# Patient Record
Sex: Male | Born: 1955 | Race: White | Hispanic: No | Marital: Single | Smoking: Never smoker
Health system: Southern US, Community
[De-identification: ages and names within clinical notes are randomized; demographics above are authoritative.]

## PROBLEM LIST (undated history)

## (undated) DIAGNOSIS — E119 Type 2 diabetes mellitus without complications: Secondary | ICD-10-CM

## (undated) DIAGNOSIS — S86812A Strain of other muscle(s) and tendon(s) at lower leg level, left leg, initial encounter: Secondary | ICD-10-CM

## (undated) DIAGNOSIS — F79 Unspecified intellectual disabilities: Secondary | ICD-10-CM

## (undated) DIAGNOSIS — J309 Allergic rhinitis, unspecified: Secondary | ICD-10-CM

## (undated) DIAGNOSIS — M1990 Unspecified osteoarthritis, unspecified site: Secondary | ICD-10-CM

## (undated) DIAGNOSIS — G4730 Sleep apnea, unspecified: Secondary | ICD-10-CM

## (undated) DIAGNOSIS — T7840XA Allergy, unspecified, initial encounter: Secondary | ICD-10-CM

## (undated) DIAGNOSIS — K429 Umbilical hernia without obstruction or gangrene: Secondary | ICD-10-CM

## (undated) DIAGNOSIS — K219 Gastro-esophageal reflux disease without esophagitis: Secondary | ICD-10-CM

## (undated) DIAGNOSIS — R609 Edema, unspecified: Secondary | ICD-10-CM

## (undated) DIAGNOSIS — G4733 Obstructive sleep apnea (adult) (pediatric): Secondary | ICD-10-CM

## (undated) DIAGNOSIS — K5730 Diverticulosis of large intestine without perforation or abscess without bleeding: Secondary | ICD-10-CM

## (undated) DIAGNOSIS — E669 Obesity, unspecified: Secondary | ICD-10-CM

## (undated) DIAGNOSIS — I10 Essential (primary) hypertension: Secondary | ICD-10-CM

## (undated) HISTORY — DX: Edema, unspecified: R60.9

## (undated) HISTORY — DX: Sleep apnea, unspecified: G47.30

## (undated) HISTORY — DX: Obstructive sleep apnea (adult) (pediatric): G47.33

## (undated) HISTORY — DX: Umbilical hernia without obstruction or gangrene: K42.9

## (undated) HISTORY — DX: Unspecified osteoarthritis, unspecified site: M19.90

## (undated) HISTORY — DX: Obesity, unspecified: E66.9

## (undated) HISTORY — PX: COLONOSCOPY: SHX174

## (undated) HISTORY — DX: Type 2 diabetes mellitus without complications: E11.9

## (undated) HISTORY — DX: Diverticulosis of large intestine without perforation or abscess without bleeding: K57.30

## (undated) HISTORY — DX: Gastro-esophageal reflux disease without esophagitis: K21.9

## (undated) HISTORY — DX: Allergy, unspecified, initial encounter: T78.40XA

## (undated) HISTORY — DX: Essential (primary) hypertension: I10

## (undated) HISTORY — DX: Allergic rhinitis, unspecified: J30.9

## (undated) HISTORY — PX: HERNIA REPAIR: SHX51

## (undated) HISTORY — PX: NASAL SEPTUM SURGERY: SHX37

## (undated) HISTORY — DX: Unspecified intellectual disabilities: F79

---

## 2000-04-08 ENCOUNTER — Emergency Department (HOSPITAL_COMMUNITY): Admission: EM | Admit: 2000-04-08 | Discharge: 2000-04-09 | Payer: Self-pay | Admitting: Emergency Medicine

## 2000-04-09 ENCOUNTER — Encounter: Payer: Self-pay | Admitting: Emergency Medicine

## 2000-08-09 ENCOUNTER — Inpatient Hospital Stay (HOSPITAL_COMMUNITY): Admission: AD | Admit: 2000-08-09 | Discharge: 2000-08-12 | Payer: Self-pay | Admitting: Internal Medicine

## 2001-04-05 ENCOUNTER — Ambulatory Visit (HOSPITAL_BASED_OUTPATIENT_CLINIC_OR_DEPARTMENT_OTHER): Admission: RE | Admit: 2001-04-05 | Discharge: 2001-04-05 | Payer: Self-pay | Admitting: Internal Medicine

## 2001-08-15 ENCOUNTER — Encounter: Admission: RE | Admit: 2001-08-15 | Discharge: 2001-08-15 | Payer: Self-pay | Admitting: Internal Medicine

## 2001-08-15 ENCOUNTER — Encounter: Payer: Self-pay | Admitting: Internal Medicine

## 2002-09-13 ENCOUNTER — Encounter: Admission: RE | Admit: 2002-09-13 | Discharge: 2002-09-13 | Payer: Self-pay | Admitting: Internal Medicine

## 2002-09-13 ENCOUNTER — Encounter: Payer: Self-pay | Admitting: Internal Medicine

## 2003-01-09 ENCOUNTER — Encounter: Payer: Self-pay | Admitting: Internal Medicine

## 2003-01-09 ENCOUNTER — Encounter: Admission: RE | Admit: 2003-01-09 | Discharge: 2003-01-09 | Payer: Self-pay | Admitting: Internal Medicine

## 2004-01-06 ENCOUNTER — Encounter: Admission: RE | Admit: 2004-01-06 | Discharge: 2004-01-06 | Payer: Self-pay | Admitting: Internal Medicine

## 2007-05-04 ENCOUNTER — Ambulatory Visit: Payer: Self-pay | Admitting: Internal Medicine

## 2007-05-04 DIAGNOSIS — J309 Allergic rhinitis, unspecified: Secondary | ICD-10-CM | POA: Insufficient documentation

## 2007-05-05 DIAGNOSIS — G4733 Obstructive sleep apnea (adult) (pediatric): Secondary | ICD-10-CM | POA: Insufficient documentation

## 2007-05-25 ENCOUNTER — Ambulatory Visit: Payer: Self-pay | Admitting: Internal Medicine

## 2007-08-10 ENCOUNTER — Encounter: Payer: Self-pay | Admitting: Internal Medicine

## 2009-02-24 ENCOUNTER — Ambulatory Visit: Payer: Self-pay | Admitting: Internal Medicine

## 2009-04-17 ENCOUNTER — Ambulatory Visit: Payer: Self-pay | Admitting: Internal Medicine

## 2009-08-07 ENCOUNTER — Ambulatory Visit: Payer: Self-pay | Admitting: Internal Medicine

## 2009-12-25 ENCOUNTER — Ambulatory Visit: Payer: Self-pay | Admitting: Internal Medicine

## 2010-02-05 ENCOUNTER — Ambulatory Visit: Payer: Self-pay | Admitting: Internal Medicine

## 2010-08-06 NOTE — H&P (Signed)
[facility]  Patient:    Alejandro Brown, Alejandro Brown                      MRN: [id number] Adm. Date:  [id number] Attending:  Sharlet Salina                         History and Physical  CHIEF COMPLAINT:  Left leg hurts.  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  He denied any trauma to the area.  It was painful to palpation but no mass was palpable.  There was a small amount of erythema in a linear distribution in the anterior upper thigh on exam.  It was slightly warm to touch, as well.  He was afebrile yesterday in the office.  I did do a CBC because he has a history of recurrent cellulitis in his leg from portal of entry being athletes foot (Tinea pedis).  He was hospitalized at this facility November 04, 1992 with a very similar presentation.  He had pain in his left groin and anterior thigh.  He had ridden his bicycle over the previous weekend and we thought he had strained a muscle.  However, he developed a significant cellulitis with fever.  He required IV antibiotics and was hospitalized for several days at that time.  He had a similar episode in his right leg and was treated as an outpatient in April 1997.  However, last evening, we found out his white blood cell count was significantly elevated at 28,200.  We had given him a prescription for Keflex 500 mg p.o. t.i.d. and we contacted him and told him to start taking that medication.  Mother says that within an hour or two after leaving my office last yesterday afternoon his leg became much redder throughout.  They did call Dr. Smith Mince who was on call for me last evening and she increased the dose of Keflex.  Today on admission he is complaining of some shaking chills and malaise.  His leg is significantly red in a more or less diffuse distribution.  It is  swollen and somewhat indurated and warm to touch.  His left calf measures 45.5 cm, right calf measures 42.5 cm; left thigh measures 61 cm; right thigh 58.5 cm.  He is admitted now for IV antibiotics.  He was given 1 g IM of Rocephin in the office early this morning.  He had tetanus immunization in the office in January 1999.  He has a temperature on admission of 99.4 degrees, blood pressure 118/80.  He wears tennis shoes to work and usually wears the same pair every day, but does change shoes when he gets home.  He has a significant case of Tinea pedis in the web spaces of both feet.  We have talked with him previously about foot hygiene but I think he gets a little lax with that.  He also has significant onychomycosis.  He is obese with weight being 234 pounds. He has no other medical illnesses other than some mild allergic rhinitis.  PAST MEDICAL HISTORY:  He did have surgery for deviated septum approximately 1979.  No history of serious illnesses other than the hospitalization for cellulitis in the left lower extremity in 1994.  ALLERGIES:  No known drug allergies.  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 [employer].  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  His father is retired.  His mother has a Ph.D. in education and formerly was a professor at [employer] but now does Catering manager work.  He has a sister who is a nurse for Dr. Willa Rough at [employer].  He does not smoke or use alcohol.  FAMILY HISTORY:  Father has history of hyperlipidemia and a melanoma which was excised a few years ago and has not recurred.  Sister has history of breast cancer.  Mother has history of GE reflux and recurrent urinary tract infections.  He has two brothers who are in good health.  REVIEW OF SYSTEMS:  Remarkable for decreased appetite, malaise, fatigue, and pain in his left leg.  Also complaining of some shaking chills.   He is not his usual energetic, talkative self by any means today.  PHYSICAL EXAMINATION:  VITAL SIGNS:  Temperature 99.4 degrees orally, blood pressure 118/80, weight 234 pounds, pulse 90.  SKIN:  Warm and dry.  The left lower leg has macular erythema in a fairly diffuse distribution.  There is a small amount of edema that is slightly pitting in the lower leg.  He complains of pain with minimal palpation of the lower leg and thigh area.  NODES:  None.  HEENT:  Head is normocephalic, atraumatic.  Sclerae and conjunctivae are clear.  Pharynx is clear.  TMs are clear.  NECK:  Supple without thyromegaly.  No carotid bruits.  CHEST:  Clear to auscultation.  CARDIAC:  Regular rate and rhythm, normal S1 and S2.  ABDOMEN:  Bowel sounds are active.  Abdomen is obese, soft, nondistended, with no hepatosplenomegaly, masses, or tenderness.  GENITALIA:  Normal male.  EXTREMITIES:  No pitting edema of the right lower extremity.  Left lower extremity described under skin exam.  NEUROLOGIC:  No gross focal deficits.  He is alert and oriented and functions very well with very mild mental retardation.  IMPRESSION: 1. Extensive cellulitis of the left lower extremity, rule out sepsis. 2. Tinea pedis which is the likely portal of entry in this case. 3. Onychomycosis. 4. Obesity.  PLAN:  Patient will be given IV Rocephin after blood cultures are obtained on a STAT basis.  Serial CBCs will be followed.  He will be given Rocephin 1 g IV q.d.  His mother is quite worried about him and asked that we obtain infectious disease consultation, which we did on last admission in 1994.  I do feel that this is probably a staphylococcal or a streptococcal infection and should respond within 48-72 hours with IV antibiotics quite nicely.  We will talk with him once again about proper foot care and prescribe Spectazole cream in the web spaces of both feet. DD:  08/09/00 TD:  08/10/00 Job:  [id number]

## 2010-08-06 NOTE — Discharge Summary (Signed)
. [facility]  Patient:    Alejandro Brown, Alejandro Brown                      MRN: [id number] Adm. Date:  [id number] Disc. Date: [id number] Attending:  Sharlet Salina                           Discharge Summary  FINAL DIAGNOSES: 1. Cellulitis of the left lower extremity. 2. Tinea pedis. 3. Obesity. 4. Mild mental retardation. 5. Sleep apnea.  CONSULTING PHYSICIANS:  Dewayne Shorter, M.D.  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  He had no evidence of cellulitis at the time.  He was tender to touch in the upper anterior thigh with only a linear area of erythema that was really very vague and nondescript.  He does have a history of cellulitis in the left lower extremity requiring hospitalization in 1994.  This was thought to be due to a rather severe case of tinea pedis which was the portal of entry.  On Aug 08, 2000, he was treated with Vioxx, Vicodin, and was given a prescription for p.o. Keflex with a CBC pending.  The lab called later and said his white blood cell count was 28,200.  We called and asked him to start his p.o. Keflex.  Within a couple of hours of leaving the office, his leg began to turn diffusely red and swollen.  They contacted Talmadge Coventry, M.D. who was oncall for me and the dose of Keflex was increased.  He came to the office on Aug 09, 2000, and received 1 gram IM Rocephin and was sent to the hospital for IV antibiotics, blood cultures, etc.   HOSPITAL COURSE:  White blood cell count on admission was 19,600, hemoglobin 14.4 grams, platelet count 217,000.  CMET was within normal limits except for an albumin of 3.1 and a nonfasting glucose of 129.  SGOT was 52, SGPT was 50, total bilirubin 0.9, glycohemoglobin 5.7%.  His liver functions will be repeated as an outpatient.  Lipid profile was  normal.  TSH was normal.  Blood cultures x 2 proved to be negative.  He was treated with IV Rocephin stat upon arrival and then 1 gram IV q.d.  He was seen in consultation by Dewayne Shorter, M.D., infectious disease consultation, who agreed with our treatment plans and agreed that the likely portal of entry was tinea pedis between the web spaces of his feet.  He had not been real careful with foot care previously.  He needed to be reminded in foot hygeine, etc., and we did that during this hospitalization.  He also was overweight and was seen by dietician and was counseled with regard to a low fat, low calorie diet.  He needs to get more exercise.  He does work as a Copy at [employer], but does not get much exercise otherwise.  He resides with his parents who are his guardians.  He does very well and is actually able to drive some.  He is very active in Media planner.  He improved dramatically over 48 hours and by Aug 11, 2000, his white count was down to 9,200.  He defervesced very quickly.  He had a low grade temperature on admission of around 100 degrees orally.  He improved enough to be discharged by Aug 12, 2000.  There was less erythema and swelling in the leg, but it was still quite painful for him to ambulate and he did ambulate with a limp.  Moist heat was applied to the leg during the hospitalization and he is to continue that at home along with elevation of the left lower extremity.  He is being discharged home on Keflex as recommended by Dr. Orvan Falconer 500 mg p.o. t.i.d. for 10 days, Vioxx 25 mg daily for inflammation, and Vicodin one or two p.o. q.4h. if needed for severe pain. For tinea pedis he will be treated spectazole cream twice a day between toes and is to wash between each toe carefully daily and dry between each toe carefully daily.  He snores rather loudly and mother thinks he may have some sleep apnea.  He will be scheduled for a sleep study in the near  future.  Also he may need to have ENT consultation to see if his adenoids are enlarged and that may cause him to do a fair amount of mouth breathing.  It also could contribute to his snoring.  He does have a history of deviated septum which was repaired by Dr. Lyman Bishop many years ago.  All of this will be taken up as an outpatient.  He does have significant onychomycosis and we will likely treat him with Lamizil, but we are going to wait until his cellulitis has completely cleared up.  Thus, he was discharged on Aug 12, 2000, in stable condition with plans to follow up with me on Aug 16, 2000. DD:  08/12/00 TD:  08/14/00 Job: [id number]

## 2010-08-19 ENCOUNTER — Ambulatory Visit (INDEPENDENT_AMBULATORY_CARE_PROVIDER_SITE_OTHER): Payer: Federal, State, Local not specified - PPO | Admitting: Internal Medicine

## 2010-08-19 ENCOUNTER — Encounter: Payer: Self-pay | Admitting: Internal Medicine

## 2010-08-19 DIAGNOSIS — E669 Obesity, unspecified: Secondary | ICD-10-CM

## 2010-08-19 DIAGNOSIS — E119 Type 2 diabetes mellitus without complications: Secondary | ICD-10-CM

## 2010-08-19 DIAGNOSIS — Z0000 Encounter for general adult medical examination without abnormal findings: Secondary | ICD-10-CM

## 2010-08-19 DIAGNOSIS — F70 Mild intellectual disabilities: Secondary | ICD-10-CM

## 2010-08-19 DIAGNOSIS — I10 Essential (primary) hypertension: Secondary | ICD-10-CM

## 2010-08-19 DIAGNOSIS — G4730 Sleep apnea, unspecified: Secondary | ICD-10-CM

## 2010-08-19 DIAGNOSIS — R609 Edema, unspecified: Secondary | ICD-10-CM

## 2010-08-19 LAB — POCT URINALYSIS DIPSTICK
Bilirubin, UA: NEGATIVE
Glucose, UA: NEGATIVE
Leukocytes, UA: NEGATIVE
Nitrite, UA: NEGATIVE
pH, UA: 6

## 2010-08-19 LAB — CBC WITH DIFFERENTIAL/PLATELET
HCT: 46.7 % (ref 39.0–52.0)
Hemoglobin: 15.1 g/dL (ref 13.0–17.0)
Lymphs Abs: 1.9 10*3/uL (ref 0.7–4.0)
MCH: 29.9 pg (ref 26.0–34.0)
Monocytes Absolute: 0.5 10*3/uL (ref 0.1–1.0)
Monocytes Relative: 7 % (ref 3–12)
Neutro Abs: 4.1 10*3/uL (ref 1.7–7.7)
Neutrophils Relative %: 59 % (ref 43–77)
RBC: 5.05 MIL/uL (ref 4.22–5.81)

## 2010-08-19 LAB — COMPREHENSIVE METABOLIC PANEL
Albumin: 3.7 g/dL (ref 3.5–5.2)
BUN: 13 mg/dL (ref 6–23)
CO2: 28 mEq/L (ref 19–32)
Calcium: 9.6 mg/dL (ref 8.4–10.5)
Glucose, Bld: 123 mg/dL — ABNORMAL HIGH (ref 70–99)
Potassium: 4.7 mEq/L (ref 3.5–5.3)
Sodium: 142 mEq/L (ref 135–145)
Total Protein: 6.2 g/dL (ref 6.0–8.3)

## 2010-08-19 LAB — HEMOGLOBIN A1C
Hgb A1c MFr Bld: 6 % — ABNORMAL HIGH (ref <5.7)
Mean Plasma Glucose: 126 mg/dL — ABNORMAL HIGH (ref <117)

## 2010-08-19 LAB — PSA: PSA: 2.48 ng/mL (ref <=4.00)

## 2010-08-19 MED ORDER — RAMIPRIL 5 MG PO CAPS: 5.0000 mg | ORAL_CAPSULE | Freq: Every day | ORAL | Status: DC

## 2010-08-19 MED ORDER — FUROSEMIDE 20 MG PO TABS: 20.0000 mg | ORAL_TABLET | Freq: Every day | ORAL | Status: DC

## 2010-08-19 MED ORDER — METFORMIN HCL 500 MG PO TABS: 500.0000 mg | ORAL_TABLET | Freq: Two times a day (BID) | ORAL | Status: DC

## 2010-08-19 NOTE — Patient Instructions (Signed)
Try to diet exercise and lose weight. Take meds as directed. Watch snacks and sweets. See [place] in 6 months.

## 2010-08-19 NOTE — Progress Notes (Signed)
  Subjective:    Patient ID: Alejandro Brown, male    DOB: 03/25/1955, 55 y.o.   MRN: [id number]  HPI patient in today for evaluation of medical problems and physical examination. His mother just recently died of metastatic lung cancer. He and his father live alone presently. Father has dementia. Family members are checking and from time to time. Patient has history of hypertension, diabetes mellitus, sleep apnea, obesity, dependent edema, mild mental retardation. Says he has been compliant with all of his medications. Not motivated to exercise. Weight is 2 pounds less than visit November 2011. 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 No recent eye exam. Had Pneumovax May 2011. Had tDAP vaccine May 2011. Says he is sleeping okay and that his appetite is okay. Was tearful in the office today.    Review of Systems  Constitutional: Negative for fever, fatigue and unexpected weight change.  HENT: Negative for hearing loss, congestion and rhinorrhea.   Eyes: Negative for discharge and visual disturbance.  Respiratory: Negative for chest tightness and shortness of breath.   Cardiovascular: Positive for leg swelling. Negative for chest pain.  Gastrointestinal: Negative for abdominal distention.  Genitourinary: Negative for dysuria, difficulty urinating and penile pain.  Musculoskeletal: Negative for back pain, joint swelling and arthralgias.  Neurological: Negative for dizziness, tremors, seizures, speech difficulty and numbness.  Hematological: Negative.   Psychiatric/Behavioral: Positive for dysphoric mood. Negative for hallucinations, behavioral problems, confusion and agitation.       Objective:   Physical Exam  Constitutional: He is oriented to person, place, and time.  HENT:  Head: Normocephalic and atraumatic.  Right Ear: External ear normal.  Left Ear: External ear normal.  Nose: Nose normal.  Mouth/Throat: Oropharynx is clear and moist.  Eyes: EOM are normal. Pupils  are equal, round, and reactive to light. Right eye exhibits no discharge. Left eye exhibits no discharge.  Neck: Neck supple. No JVD present. No thyromegaly present.  Cardiovascular: Normal rate, regular rhythm and normal heart sounds.   No murmur heard. Pulmonary/Chest: Effort normal and breath sounds normal. No respiratory distress. He has no wheezes. He has no rales.  Abdominal: Soft. Bowel sounds are normal.       Small umbilical hernia  Genitourinary: Prostate normal.  Musculoskeletal: Normal range of motion. He exhibits edema.  Lymphadenopathy:    He has cervical adenopathy.  Neurological: He is alert and oriented to person, place, and time. He has normal reflexes. He displays normal reflexes. No cranial nerve deficit. Coordination normal.  Skin: Skin is warm and dry.  Psychiatric: He has a normal mood and affect. His behavior is normal. Judgment and thought content normal.          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 Remind about diabetic eye exam. Recheck in 6 months with hemoglobin A1c and office visit. Flu vaccine fall 2012.

## 2010-08-20 ENCOUNTER — Encounter: Payer: Self-pay | Admitting: Internal Medicine

## 2010-08-31 ENCOUNTER — Ambulatory Visit (INDEPENDENT_AMBULATORY_CARE_PROVIDER_SITE_OTHER): Payer: Federal, State, Local not specified - PPO | Admitting: Internal Medicine

## 2010-08-31 ENCOUNTER — Encounter: Payer: Self-pay | Admitting: Internal Medicine

## 2010-08-31 VITALS — BP 122/80 | HR 86 | Temp 98.8°F | Ht 63.5 in | Wt 227.0 lb

## 2010-08-31 DIAGNOSIS — R609 Edema, unspecified: Secondary | ICD-10-CM

## 2010-08-31 DIAGNOSIS — F79 Unspecified intellectual disabilities: Secondary | ICD-10-CM | POA: Insufficient documentation

## 2010-08-31 DIAGNOSIS — E119 Type 2 diabetes mellitus without complications: Secondary | ICD-10-CM | POA: Insufficient documentation

## 2010-08-31 DIAGNOSIS — E669 Obesity, unspecified: Secondary | ICD-10-CM

## 2010-08-31 DIAGNOSIS — J029 Acute pharyngitis, unspecified: Secondary | ICD-10-CM

## 2010-08-31 DIAGNOSIS — I10 Essential (primary) hypertension: Secondary | ICD-10-CM | POA: Insufficient documentation

## 2010-08-31 LAB — POCT RAPID STREP A (OFFICE): Rapid Strep A Screen: NEGATIVE

## 2010-08-31 NOTE — Patient Instructions (Signed)
Take Augmentin as prescribed with food. Stay out of work tomorrow.

## 2010-08-31 NOTE — Progress Notes (Signed)
  Subjective:    Patient ID: Alejandro Brown, male    DOB: 1955/10/30, 55 y.o.   MRN: [id number]  HPI two-day history of sore throat. Denies fever, chills, cough. Complains of decreased appetite and malaise. Patient has history of mild mental retardation, hypertension, diabetes mellitus, dependent edema left leg.    Review of Systems     Objective:   Physical Exam pharynx is red; no exudate. TMs are dull bilaterally;  rapid strep screen is negative; neck supple without significant adenopathy;  chest clear        Assessment & Plan:  Pharyngitis-prescribe Augmentin 875 mg twice daily for 10 days  Dependent edema left leg secondary to recurrent cellulitis-new prescription for compression stocking left leg

## 2010-10-29 ENCOUNTER — Ambulatory Visit (INDEPENDENT_AMBULATORY_CARE_PROVIDER_SITE_OTHER): Payer: Federal, State, Local not specified - PPO | Admitting: Internal Medicine

## 2010-10-29 ENCOUNTER — Ambulatory Visit: Payer: Federal, State, Local not specified - PPO | Admitting: Internal Medicine

## 2010-10-29 ENCOUNTER — Encounter: Payer: Self-pay | Admitting: Internal Medicine

## 2010-10-29 VITALS — BP 136/86 | HR 88 | Temp 99.2°F | Ht 64.5 in | Wt 225.5 lb

## 2010-10-29 DIAGNOSIS — J069 Acute upper respiratory infection, unspecified: Secondary | ICD-10-CM

## 2010-10-29 DIAGNOSIS — E119 Type 2 diabetes mellitus without complications: Secondary | ICD-10-CM

## 2010-10-29 NOTE — Progress Notes (Signed)
  Subjective:    Patient ID: Alejandro Brown, male    DOB: 05-31-1955, 55 y.o.   MRN: [id number]  HPI 55 year old white male mildly mentally retarded with history of hypertension, obesity, diabetes mellitus, dependent edema in today with URI symptoms that started 2 days ago. Complains of sore throat, coughing, nasal congestion. Last physical exam May 2012. Hemoglobin A1c checked today. He's not been checking Accu-Cheks on a regular basis. His mother died of metastatic lung cancer earlier this spring. Living with his father who has dementia. Says he cannot find his Accu-Chek machine so we gave him a new sample machine today. There showed him how to use it.    Review of Systems     Objective:   Physical Exam chest clear; cardiac exam regular rate and rhythm; normal S1 and S2; pharynx only slightly injected; TMs not well seen secondary to cerumen; neck is supple without significant adenopathy         Assessment & Plan:  URI-prescription for Augmentin 875 mg twice daily for 10 days take with food  Adult onset diabetes mellitus- also on metformin 500 mg twice daily. Hemoglobin A1c drawn. New FreeStyle Lite glucose meter given. Prescription for diabetic test strips and lancets. Expect him to check Accu-Cheks twice daily for 3 times a week. Not motivated to diet exercise and lose weight but he will be attending golf tournament next week for several days carrying a sign for the golfers.  Followup appointment in November for six-month recheck from physical exam May 2012

## 2010-10-29 NOTE — Progress Notes (Signed)
Addended by: Judy Pimple on: 10/29/2010 04:19 PM   Modules accepted: Orders

## 2010-10-30 LAB — HEMOGLOBIN A1C: Hgb A1c MFr Bld: 6.2 % — ABNORMAL HIGH (ref <5.7)

## 2010-11-01 ENCOUNTER — Encounter: Payer: Self-pay | Admitting: Internal Medicine

## 2010-11-02 ENCOUNTER — Telehealth: Payer: Self-pay | Admitting: *Deleted

## 2010-11-02 NOTE — Telephone Encounter (Signed)
Attempted to call patient back with no answer.  MD wants pt to continue with same antibiotic at this time.

## 2010-11-02 NOTE — Telephone Encounter (Signed)
Pt called stating no improvement in symptoms.  He stated he is very congested and has a cough.  Wants to know if we can call in stronger antibiotic.

## 2010-11-04 NOTE — Telephone Encounter (Signed)
Finish course of antibiotic prescribed.

## 2010-12-03 ENCOUNTER — Telehealth: Payer: Self-pay

## 2010-12-03 MED ORDER — POTASSIUM CHLORIDE CRYS ER 20 MEQ PO TBCR: 20.0000 meq | EXTENDED_RELEASE_TABLET | Freq: Every day | ORAL | Status: DC

## 2010-12-03 NOTE — Telephone Encounter (Signed)
Medication refilled

## 2011-02-18 ENCOUNTER — Ambulatory Visit: Payer: Federal, State, Local not specified - PPO | Admitting: Internal Medicine

## 2011-03-11 ENCOUNTER — Other Ambulatory Visit (INDEPENDENT_AMBULATORY_CARE_PROVIDER_SITE_OTHER): Payer: Federal, State, Local not specified - PPO

## 2011-03-11 ENCOUNTER — Ambulatory Visit: Payer: Federal, State, Local not specified - PPO | Admitting: Internal Medicine

## 2011-03-11 ENCOUNTER — Ambulatory Visit (INDEPENDENT_AMBULATORY_CARE_PROVIDER_SITE_OTHER): Payer: Federal, State, Local not specified - PPO | Admitting: Internal Medicine

## 2011-03-11 ENCOUNTER — Encounter: Payer: Self-pay | Admitting: Internal Medicine

## 2011-03-11 DIAGNOSIS — E119 Type 2 diabetes mellitus without complications: Secondary | ICD-10-CM

## 2011-03-11 DIAGNOSIS — G4733 Obstructive sleep apnea (adult) (pediatric): Secondary | ICD-10-CM

## 2011-03-11 DIAGNOSIS — R609 Edema, unspecified: Secondary | ICD-10-CM

## 2011-03-11 DIAGNOSIS — E669 Obesity, unspecified: Secondary | ICD-10-CM

## 2011-03-11 DIAGNOSIS — I10 Essential (primary) hypertension: Secondary | ICD-10-CM

## 2011-03-11 LAB — BASIC METABOLIC PANEL
Chloride: 107 mEq/L (ref 96–112)
Creatinine, Ser: 1.2 mg/dL (ref 0.4–1.5)
Potassium: 4.3 mEq/L (ref 3.5–5.1)

## 2011-03-11 LAB — HEMOGLOBIN A1C: Hgb A1c MFr Bld: 6 % (ref 4.6–6.5)

## 2011-03-11 MED ORDER — GLUCOSE BLOOD VI DISK: 1.0000 | DISK | Freq: Two times a day (BID) | Status: DC

## 2011-03-11 NOTE — Progress Notes (Signed)
  Subjective:    Patient ID: Alejandro Brown, male    DOB: 09/18/1955, 55 y.o.   MRN: [id number]  HPI  New to me - transfer from [facility] affiliate practice [facility]) - here to establish care  reviewed chronic medical issues:  DM2 = prev rx'd metformin but noncompliant with same- report adverse side effects on metformin (diarrhea), irregular home monitoring of cbgs (sister in law now helping pt to attend to same since pt's mom passed 08/20/2010)  Chronic edema - L>R, uses compression stockings and request rx for new pain - on daily diuretics but noncompliant with KCl due to size of tabs  OSA - noncompliant with CPAP due to discomfort of machine -   Mild MR, developmental delay - "deciving" per sister in law - has worked at [employer] for >18 years - lived with mom until her death Aug 20, 2010 - now alone but sister in law helping pt to attend to medical issues  Past Medical History  Diagnosis Date  . Obesity   . Mental retardation     mild  . GERD (gastroesophageal reflux disease)   . Hypertension   . Dependent edema   . ALLERGIC RHINITIS   . OBSTRUCTIVE SLEEP APNEA 05/05/2007  . Type II or unspecified type diabetes mellitus without mention of complication, not stated as uncontrolled      Review of Systems  Constitutional: Negative for fever, activity change, appetite change and unexpected weight change.  Respiratory: Negative for cough and shortness of breath.   Cardiovascular: Negative for chest pain and palpitations.       Objective:   Physical Exam BP 130/88  Pulse 96  Temp(Src) 97.9 F (36.6 C) (Oral)  Ht 5\' 5"  (1.651 m)  Wt 237 lb 1.9 oz (107.557 kg)  BMI 39.46 kg/m2  SpO2 96% Wt Readings from Last 3 Encounters:  03/11/11 237 lb 1.9 oz (107.557 kg)  10/29/10 225 lb 8 oz (102.286 kg)  08/31/10 227 lb (102.967 kg)   Constitutional:  He is overweight, but appears well-developed and well-nourished. No distress. Sister in Editor, commissioning at side Neck: Thick; Normal range of motion. Neck  supple. No JVD present. No thyromegaly present.  Cardiovascular: Normal rate, regular rhythm and normal heart sounds.  No murmur heard. chronic trace BLE edema, L>R chronic - wearing compression stocking on LLE Pulmonary/Chest: Effort normal and breath sounds normal. No respiratory distress. no wheezes.  Abdominal: Soft. Bowel sounds are normal. Patient exhibits no distension. There is no tenderness.  Neurological: he is alert and oriented to person, place, and time. No cranial nerve deficit. Coordination normal.  Skin: Skin is warm and dry.  No erythema or ulceration.  Psychiatric: he has a normal mood and affect. behavior is normal. Judgment and thought content normal.   Lab Results  Component Value Date   WBC 7.0 08/19/2010   HGB 15.1 08/19/2010   HCT 46.7 08/19/2010   PLT 259 08/19/2010   GLUCOSE 123* 08/19/2010   CHOL 196 08/19/2010   TRIG 135 08/19/2010   HDL 48 08/19/2010   LDLCALC 121* 08/19/2010   ALT 21 08/19/2010   AST 17 08/19/2010   NA 142 08/19/2010   K 4.7 08/19/2010   CL 105 08/19/2010   CREATININE 1.09 08/19/2010   BUN 13 08/19/2010   CO2 28 08/19/2010   PSA 2.48 08/19/2010   HGBA1C 6.2* 10/29/2010      Assessment & Plan:  See problem list. Medications and labs reviewed today.

## 2011-03-11 NOTE — Assessment & Plan Note (Signed)
BP Readings from Last 3 Encounters:  03/11/11 130/88  10/29/10 136/86  08/31/10 122/80   On ramapril The current medical regimen is effective;  continue present plan and medications.

## 2011-03-11 NOTE — Assessment & Plan Note (Signed)
Wt Readings from Last 3 Encounters:  03/11/11 237 lb 1.9 oz (107.557 kg)  10/29/10 225 lb 8 oz (102.286 kg)  08/31/10 227 lb (102.967 kg)

## 2011-03-11 NOTE — Assessment & Plan Note (Signed)
Unable to tolerate metformin due to GI side effects  Managing meds and home cbgs complicated by mild MR Check a1c now and consider actos or other medication as needed Lab Results  Component Value Date   HGBA1C 6.2* 10/29/2010

## 2011-03-11 NOTE — Patient Instructions (Signed)
It was good to see you today. Medications reviewed, changes updated today. Test(s) ordered today. Your results will be called to you after review (48-72hours after test completion). If any changes need to be made, you will be notified at that time. Written prescription for compression hose provided as requested Please schedule followup in 3 months, call sooner if problems.

## 2011-03-11 NOTE — Assessment & Plan Note (Signed)
chronic LLE>RLE - written rx for compression hose done

## 2011-03-11 NOTE — Assessment & Plan Note (Signed)
Prev seen by Dr. Maple Hudson for same -  Noncompliant with rx'd CPAP

## 2011-03-13 ENCOUNTER — Encounter: Payer: Self-pay | Admitting: Internal Medicine

## 2011-05-03 ENCOUNTER — Ambulatory Visit (INDEPENDENT_AMBULATORY_CARE_PROVIDER_SITE_OTHER): Payer: Federal, State, Local not specified - PPO | Admitting: Family Medicine

## 2011-05-03 ENCOUNTER — Ambulatory Visit: Payer: Federal, State, Local not specified - PPO

## 2011-05-03 VITALS — BP 138/88 | HR 86 | Temp 98.0°F | Resp 16 | Ht 64.25 in | Wt 233.6 lb

## 2011-05-03 DIAGNOSIS — M62838 Other muscle spasm: Secondary | ICD-10-CM

## 2011-05-03 DIAGNOSIS — M545 Low back pain, unspecified: Secondary | ICD-10-CM

## 2011-05-03 MED ORDER — CYCLOBENZAPRINE HCL 5 MG PO TABS: 5.0000 mg | ORAL_TABLET | Freq: Three times a day (TID) | ORAL | Status: AC | PRN

## 2011-05-03 MED ORDER — TRAMADOL HCL 50 MG PO TABS: 50.0000 mg | ORAL_TABLET | Freq: Three times a day (TID) | ORAL | Status: AC | PRN

## 2011-05-03 NOTE — Progress Notes (Signed)
[facility]:  Office Visit  Chief Complaint:  Chief Complaint  Patient presents with  . Back Pain    x 2wks    HPI: Alejandro Brown is a 56 y.o. male who complains of  Low back pain x 2 weeks, occurred at home.  Prior injury 8-10 years ago at work but no triggers this episode. 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 He states that he woke up with the low back pain. It is achey, intermittent. No numbness, weakness. No loss of bowel or bladder control.   Past Medical History  Diagnosis Date  . Obesity   . Mental retardation     mild  . GERD (gastroesophageal reflux disease)   . Hypertension   . 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 ALLERGIC RHINITIS   . OBSTRUCTIVE SLEEP APNEA     noncompliant with CPAP  . Type II or unspecified type diabetes mellitus without mention of complication, not stated as uncontrolled     diet controlled   Past Surgical History  Procedure Date  . Nasal septum surgery    History   Social History  . Marital Status: Single    Spouse Name: N/A    Number of Children: N/A  . Years of Education: N/A   Social History Main Topics  . Smoking status: Never Smoker   . Smokeless tobacco: Never Used  . Alcohol Use: No  . Drug Use: No  . Sexually Active: None   Other Topics Concern  . None   Social History Narrative  . None   Family History  Problem Relation Age of Onset  . Breast cancer Sister   . Breast cancer Other    Allergies  Allergen Reactions  . Biaxin Nausea Only   Prior to Admission medications   Medication Sig Start Date End Date Taking? Authorizing Provider  furosemide (LASIX) 20 MG tablet Take 20 mg by mouth daily.   08/19/10  Yes Margaree Mackintosh, MD  ramipril (ALTACE) 5 MG capsule Take 1 capsule (5 mg total) by mouth daily. 08/19/10  Yes Margaree Mackintosh, MD  Glucose Blood (BAYER BREEZE 2 TEST) DISK 1 each by In Vitro route 2 (two) times daily. 03/11/11   Rene Paci, MD    ibuprofen (ADVIL,MOTRIN) 800 MG tablet Take 800 mg by mouth every 8 (eight) hours as needed.      Historical Provider, MD     ROS: The patient denies fevers, chills, night sweats, unintentional weight loss, chest pain, palpitations, wheezing, dyspnea on exertion, nausea, vomiting, abdominal pain, dysuria, hematuria, melena, numbness, weakness, or tingling.+ left sided back pain  All other systems have been reviewed and were otherwise negative with the exception of those mentioned in the HPI and as above.    PHYSICAL EXAM: Filed Vitals:   05/03/11 1709  BP: 138/88  Pulse: 86  Temp: 98 F (36.7 C)  Resp: 16   Filed Vitals:   05/03/11 1709  Height: 5' 4.25" (1.632 m)  Weight: 233 lb 9.6 oz (105.96 kg)   Body mass index is 39.79 kg/(m^2).  General: Alert, no acute distress, obese HEENT:  Normocephalic, atraumatic, oropharynx patent. Cardiovascular:  Regular rate and rhythm, no rubs murmurs or gallops.  No Carotid bruits, radial pulse intact. No pedal edema.  Respiratory: Clear to auscultation bilaterally.  No wheezes, rales, or rhonchi.  No cyanosis, no use of accessory musculature GI: No organomegaly, abdomen is  soft and non-tender, positive bowel sounds.  No masses. Skin: No rashes. Neurologic: Facial musculature symmetric. Psychiatric: Patient is appropriate throughout our interaction. Lymphatic: No  cervical lymphadenopathy Musculoskeletal: Gait intact. + pain with right side bending,Flexion, extension, abduction, adduction ROM intact. DTR knee intact. No scoliosis. 5/5 strength in lower extremities.     EKG/XRAY:   Primary read interpreted by Dr. Conley Rolls at [facility]. No dislocation or fx of lumbar spine.    ASSESSMENT/PLAN: Encounter Diagnosis  Name Primary?  . Low back pain without sciatica Yes   1. MSK sprain/strain/pain-c/w Ibuporofen 800 mg BID, Will rx Tramadol and Flexeril . F/u prn or in 2-4 weeks if no improvement. Patient was given precautions on medications and their  sideeffects and appropriate usage.     Hamilton Capri PHUONG, DO 05/03/2011 5:39 PM

## 2011-06-10 ENCOUNTER — Encounter: Payer: Self-pay | Admitting: Internal Medicine

## 2011-06-10 ENCOUNTER — Other Ambulatory Visit (INDEPENDENT_AMBULATORY_CARE_PROVIDER_SITE_OTHER): Payer: Federal, State, Local not specified - PPO

## 2011-06-10 ENCOUNTER — Telehealth: Payer: Self-pay | Admitting: *Deleted

## 2011-06-10 ENCOUNTER — Ambulatory Visit (INDEPENDENT_AMBULATORY_CARE_PROVIDER_SITE_OTHER): Payer: Federal, State, Local not specified - PPO | Admitting: Internal Medicine

## 2011-06-10 VITALS — BP 138/88 | HR 86 | Temp 97.5°F | Resp 18 | Wt 235.1 lb

## 2011-06-10 DIAGNOSIS — E119 Type 2 diabetes mellitus without complications: Secondary | ICD-10-CM

## 2011-06-10 DIAGNOSIS — I10 Essential (primary) hypertension: Secondary | ICD-10-CM

## 2011-06-10 DIAGNOSIS — J309 Allergic rhinitis, unspecified: Secondary | ICD-10-CM

## 2011-06-10 MED ORDER — GLUCOSE BLOOD VI DISK: 1.0000 | DISK | Freq: Two times a day (BID) | Status: DC

## 2011-06-10 MED ORDER — LORATADINE 10 MG PO TABS: 10.0000 mg | ORAL_TABLET | Freq: Every day | ORAL | Status: DC | PRN

## 2011-06-10 NOTE — Assessment & Plan Note (Signed)
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

## 2011-06-10 NOTE — Patient Instructions (Signed)
It was good to see you today. Medications reviewed, start claritin daily for ext 1 month, then as needed - no other change at this time Test(s) ordered today. Your results will be called to you after review (48-72hours after test completion). If any changes need to be made, you will be notified at that time. Written prescription for [facility] testing provided as requested Please schedule followup in 4 months, call sooner if problems.

## 2011-06-10 NOTE — Progress Notes (Signed)
Subjective:    Patient ID: Alejandro Brown, male    DOB: 11-07-1955, 56 y.o.   MRN: [id number]  HPI here for follow up - reviewed chronic medical issues:  DM2 = prev rx'd metformin but noncompliant with same- report adverse side effects on metformin (diarrhea), irregular home monitoring of cbgs (sister in law now helping pt to attend to same since pt's mom passed 08/25/10)  Chronic edema - L>R, uses compression stockings and request rx for new pain - on daily diuretics but noncompliant with KCl due to size of tabs  OSA - noncompliant with CPAP due to discomfort of machine -   Mild MR, developmental delay - "deciving" per sister in law - has worked at [employer] for >18 years - lived with mom until her death August 25, 2010 - now alone but sister in law helping pt to attend to medical issues  Complains of recent flare of allergies and sinus symptoms. Onset 2 and half weeks ago. Associated with sore throat and dry cough but no fever -not using any over-the-counter medications or other treatment for same  Past Medical History  Diagnosis Date  . Obesity   . Mental retardation     mild  . GERD (gastroesophageal reflux disease)   . Hypertension   . 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 ALLERGIC RHINITIS   . OBSTRUCTIVE SLEEP APNEA     noncompliant with CPAP  . Type II or unspecified type diabetes mellitus without mention of complication, not stated as uncontrolled     diet controlled     Review of Systems  Constitutional: Negative for fever, activity change, appetite change and unexpected weight change.  Respiratory: Positive for cough. Negative for shortness of breath.   Cardiovascular: Negative for chest pain and palpitations.       Objective:   Physical Exam  BP 138/88  Pulse 86  Temp(Src) 97.5 F (36.4 C) (Oral)  Resp 18  Wt 235 lb 2 oz (106.652 kg)  SpO2 93% Wt Readings from Last 3 Encounters:  06/10/11 235 lb 2 oz (106.652 kg)  05/03/11 233 lb 9.6 oz (105.96 kg)  03/11/11 237 lb 1.9  oz (107.557 kg)   Constitutional:  He is overweight, but appears well-developed and well-nourished. No distress. Sister in Editor, commissioning at side HENT: NCAT, sinus nontender, OP mildly red, no exudate; TMs clear Neck: Thick; Normal range of motion. Neck supple. No JVD present. No thyromegaly present.  Cardiovascular: Normal rate, regular rhythm and normal heart sounds.  No murmur heard. chronic trace BLE edema, L>R chronic - wearing compression stocking on LLE Pulmonary/Chest: Effort normal and breath sounds normal. No respiratory distress. no wheezes.  Abdominal: Soft. Bowel sounds are normal. Patient exhibits no distension. There is no tenderness.  Neurological: he is alert and oriented to person, place, and time. No cranial nerve deficit. Coordination normal.  Skin: Skin is warm and dry.  No erythema or ulceration.  Psychiatric: he has a normal mood and affect. behavior is normal. Judgment and thought content normal.   Lab Results  Component Value Date   WBC 7.0 08/19/2010   HGB 15.1 08/19/2010   HCT 46.7 08/19/2010   PLT 259 08/19/2010   GLUCOSE 100* 03/11/2011   CHOL 196 08/19/2010   TRIG 135 08/19/2010   HDL 48 08/19/2010   LDLCALC 121* 08/19/2010   ALT 21 08/19/2010   AST 17 08/19/2010   NA 142 03/11/2011   K 4.3 03/11/2011   CL 107 03/11/2011  CREATININE 1.2 03/11/2011   BUN 14 03/11/2011   CO2 28 03/11/2011   PSA 2.48 08/19/2010   HGBA1C 6.0 03/11/2011      Assessment & Plan:  See problem list. Medications and labs reviewed today.

## 2011-06-10 NOTE — Assessment & Plan Note (Signed)
Unable to tolerate metformin due to GI side effects  Managing meds and home cbgs complicated by mild MR Check a1c now and consider actos or other medication as needed Lab Results  Component Value Date   HGBA1C 6.0 03/11/2011

## 2011-06-10 NOTE — Assessment & Plan Note (Signed)
BP Readings from Last 3 Encounters:  06/10/11 138/88  05/03/11 138/88  03/11/11 130/88   On ramapril The current medical regimen is effective;  continue present plan and medications.

## 2011-06-10 NOTE — Telephone Encounter (Signed)
LM for patient to inform.

## 2011-06-10 NOTE — Telephone Encounter (Signed)
Message copied by Regis Bill on Fri Jun 10, 2011  3:29 PM ------      Message from: Rene Paci A      Created: Fri Jun 10, 2011  2:28 PM       Please call patient: Results ok. No treatment changes recommended. Thanks

## 2011-06-21 ENCOUNTER — Telehealth: Payer: Self-pay | Admitting: *Deleted

## 2011-06-21 NOTE — Telephone Encounter (Signed)
Notified Alan Ripper with md response... 06/21/11@3 :31pm/LMB

## 2011-06-21 NOTE — Telephone Encounter (Signed)
DM well controlled - no changes recommended -

## 2011-06-21 NOTE — Telephone Encounter (Signed)
Left msg on vm never received results back from labs done 2 weeks ago. Pls advise... 06/21/11@2 :10pm/LMB

## 2011-10-07 ENCOUNTER — Ambulatory Visit (INDEPENDENT_AMBULATORY_CARE_PROVIDER_SITE_OTHER): Payer: Federal, State, Local not specified - PPO | Admitting: Internal Medicine

## 2011-10-07 ENCOUNTER — Encounter: Payer: Self-pay | Admitting: Internal Medicine

## 2011-10-07 ENCOUNTER — Other Ambulatory Visit (INDEPENDENT_AMBULATORY_CARE_PROVIDER_SITE_OTHER): Payer: Federal, State, Local not specified - PPO

## 2011-10-07 VITALS — BP 116/74 | HR 92 | Temp 97.5°F | Ht 64.25 in | Wt 245.0 lb

## 2011-10-07 DIAGNOSIS — E119 Type 2 diabetes mellitus without complications: Secondary | ICD-10-CM

## 2011-10-07 DIAGNOSIS — I10 Essential (primary) hypertension: Secondary | ICD-10-CM

## 2011-10-07 DIAGNOSIS — E669 Obesity, unspecified: Secondary | ICD-10-CM

## 2011-10-07 LAB — BASIC METABOLIC PANEL
CO2: 29 mEq/L (ref 19–32)
Calcium: 9.1 mg/dL (ref 8.4–10.5)
Chloride: 106 mEq/L (ref 96–112)
Glucose, Bld: 108 mg/dL — ABNORMAL HIGH (ref 70–99)
Sodium: 141 mEq/L (ref 135–145)

## 2011-10-07 LAB — LIPID PANEL
HDL: 44.5 mg/dL (ref >39.00)
LDL Cholesterol: 95 mg/dL (ref 0–99)
Total CHOL/HDL Ratio: 4
Triglycerides: 165 mg/dL — ABNORMAL HIGH (ref 0.0–149.0)
VLDL: 33 mg/dL (ref 0.0–40.0)

## 2011-10-07 LAB — HEMOGLOBIN A1C: Hgb A1c MFr Bld: 6.1 % (ref 4.6–6.5)

## 2011-10-07 MED ORDER — FUROSEMIDE 20 MG PO TABS: 20.0000 mg | ORAL_TABLET | Freq: Every day | ORAL | Status: DC

## 2011-10-07 MED ORDER — RAMIPRIL 5 MG PO CAPS: 5.0000 mg | ORAL_CAPSULE | Freq: Every day | ORAL | Status: DC

## 2011-10-07 NOTE — Assessment & Plan Note (Signed)
Diet controlled Hx unable to tolerate metformin due to GI side effects  Managing meds and home cbgs complicated by mild MR Check a1c now - consider actos or other medication if needed Lab Results  Component Value Date   HGBA1C 6.2 06/10/2011

## 2011-10-07 NOTE — Assessment & Plan Note (Signed)
Wt Readings from Last 3 Encounters:  10/07/11 245 lb (111.131 kg)  06/10/11 235 lb 2 oz (106.652 kg)  05/03/11 233 lb 9.6 oz (105.96 kg)  weight gain reviewed The patient is asked to make an attempt to improve diet and exercise patterns to aid in medical management of this problem.

## 2011-10-07 NOTE — Patient Instructions (Signed)
It was good to see you today. Medications reviewed,  no changes at this time. Refill on medication(s) as discussed today. Test(s) ordered today. Your results will be called to you after review (48-72hours after test completion). If any changes need to be made, you will be notified at that time. Work on lifestyle changes as discussed (low fat, low carb, increased protein diet; improved exercise efforts; weight loss) to control sugar, blood pressure and cholesterol levels and/or reduce risk of developing other medical problems. Please schedule followup in 6 months, call sooner if problems.

## 2011-10-07 NOTE — Progress Notes (Signed)
  Subjective:    Patient ID: Alejandro Brown, male    DOB: [birth year], 56 y.o.   MRN: [id number]  HPI  here for follow up - reviewed chronic medical issues:  DM2 = prev rx'd metformin but noncompliant with same- report adverse side effects on metformin (diarrhea), irregular home monitoring of cbgs (sister in law now helping pt to attend to same since pt's mom passed 09/04/2010)  Chronic edema - L>R, uses compression stockings and request rx for new pain - on daily diuretics but noncompliant with KCl due to size of tabs  OSA - noncompliant with CPAP due to discomfort of machine -   Mild MR, developmental delay - "deciving" per sister in law - has worked at [employer] for >18 years - lived with mom until her death 09/04/10 - now alone but sister in law helping pt to attend to medical issues   Past Medical History  Diagnosis Date  . Obesity   . Mental retardation     mild  . GERD (gastroesophageal reflux disease)   . Hypertension   . 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 ALLERGIC RHINITIS   . OBSTRUCTIVE SLEEP APNEA     noncompliant with CPAP  . Type II or unspecified type diabetes mellitus without mention of complication, not stated as uncontrolled     diet controlled     Review of Systems  Constitutional: Negative for fever, activity change, appetite change and unexpected weight change.  Respiratory: Negative for cough and shortness of breath.   Cardiovascular: Negative for chest pain and palpitations.       Objective:   Physical Exam  BP 116/74  Pulse 92  Temp 97.5 F (36.4 C) (Temporal)  Ht 5' 4.25" (1.632 m)  Wt 245 lb (111.131 kg)  BMI 41.73 kg/m2  SpO2 95% Wt Readings from Last 3 Encounters:  10/07/11 245 lb (111.131 kg)  06/10/11 235 lb 2 oz (106.652 kg)  05/03/11 233 lb 9.6 oz (105.96 kg)   Constitutional:  He is overweight, but appears well-developed and well-nourished. No distress. Sister in Editor, commissioning at side Neck: Thick; Normal range of motion. Neck supple. No JVD  present. No thyromegaly present.  Cardiovascular: Normal rate, regular rhythm and normal heart sounds.  No murmur heard. chronic trace BLE edema, L>R chronic - wearing compression stocking on LLE - Pulmonary/Chest: Effort normal and breath sounds normal. No respiratory distress. no wheezes.  Neurological: he is alert and oriented to person, place, and time. No cranial nerve deficit. Coordination normal.  Skin: Skin is warm and dry.  No erythema or ulceration. hair loss distal legs Psychiatric: he has a normal mood and affect. behavior is normal. Judgment and thought content normal.   Lab Results  Component Value Date   WBC 7.0 08/19/2010   HGB 15.1 08/19/2010   HCT 46.7 08/19/2010   PLT 259 08/19/2010   GLUCOSE 100* 03/11/2011   CHOL 196 08/19/2010   TRIG 135 08/19/2010   HDL 48 08/19/2010   LDLCALC 121* 08/19/2010   ALT 21 08/19/2010   AST 17 08/19/2010   NA 142 03/11/2011   K 4.3 03/11/2011   CL 107 03/11/2011   CREATININE 1.2 03/11/2011   BUN 14 03/11/2011   CO2 28 03/11/2011   PSA 2.48 08/19/2010   HGBA1C 6.2 06/10/2011      Assessment & Plan:  See problem list. Medications and labs reviewed today.

## 2011-10-07 NOTE — Assessment & Plan Note (Signed)
BP Readings from Last 3 Encounters:  10/07/11 116/74  06/10/11 138/88  05/03/11 138/88   On ramipril The current medical regimen is effective;  continue present plan and medications.

## 2011-12-20 LAB — HM DIABETES EYE EXAM: HM Diabetic Eye Exam: NOT DETECTED

## 2012-01-14 ENCOUNTER — Ambulatory Visit (INDEPENDENT_AMBULATORY_CARE_PROVIDER_SITE_OTHER): Payer: Federal, State, Local not specified - PPO | Admitting: Physician Assistant

## 2012-01-14 VITALS — BP 129/84 | HR 89 | Temp 97.8°F | Resp 18 | Ht 64.0 in | Wt 238.0 lb

## 2012-01-14 DIAGNOSIS — R05 Cough: Secondary | ICD-10-CM

## 2012-01-14 DIAGNOSIS — J029 Acute pharyngitis, unspecified: Secondary | ICD-10-CM

## 2012-01-14 DIAGNOSIS — R059 Cough, unspecified: Secondary | ICD-10-CM

## 2012-01-14 MED ORDER — HYDROCODONE-HOMATROPINE 5-1.5 MG/5ML PO SYRP: ORAL_SOLUTION | ORAL | Status: DC

## 2012-01-14 MED ORDER — AMOXICILLIN 875 MG PO TABS: 875.0000 mg | ORAL_TABLET | Freq: Two times a day (BID) | ORAL | Status: DC

## 2012-01-14 NOTE — Progress Notes (Signed)
Patient ID: Alejandro Brown MRN: [id number], DOB: Jun 17, 1955, 56 y.o. Date of Encounter: 01/14/2012, 9:03 AM  Primary Physician: Rene Paci, MD  Chief Complaint:  Chief Complaint  Patient presents with  . Cough    x 2 weeks  . Sore Throat    x 2 weeks    HPI: 56 y.o. year old male presents with a fourteen day history of sore throat and cough. Afebrile throughout. No chills. Cough is not productive and not associated with the time of day. No chest pain, wheezing, shortness of breath, dyspnea, or palpitations. No congestion, rhinorrhea, sinus pressure, otalgia, or headache. Normal hearing. No GI complaints. Able to swallow saliva, but hurts to do so. Decreased appetite secondary to sore throat. Had same issue the previous year around this time of year as well.    Past Medical History  Diagnosis Date  . Obesity   . Mental retardation     mild  . GERD (gastroesophageal reflux disease)   . Hypertension   . 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 ALLERGIC RHINITIS   . OBSTRUCTIVE SLEEP APNEA     noncompliant with CPAP  . Type II or unspecified type diabetes mellitus without mention of complication, not stated as uncontrolled     diet controlled     Home Meds: Prior to Admission medications   Medication Sig Start Date End Date Taking? Authorizing Provider  furosemide (LASIX) 20 MG tablet Take 1 tablet (20 mg total) by mouth daily. 10/07/11  Yes Newt Lukes, MD  ramipril (ALTACE) 5 MG capsule Take 1 capsule (5 mg total) by mouth daily. 10/07/11  Yes Newt Lukes, MD    Allergies:  Allergies  Allergen Reactions  . Clarithromycin Nausea Only    History   Social History  . Marital Status: Single    Spouse Name: N/A    Number of Children: N/A  . Years of Education: N/A   Occupational History  . Not on file.   Social History Main Topics  . Smoking status: Never Smoker   . Smokeless tobacco: Never Used  . Alcohol Use: No  . Drug Use: No  . Sexually  Active: Not on file   Other Topics Concern  . Not on file   Social History Narrative  . No narrative on file     Review of Systems: Constitutional: negative for chills, fever, night sweats or weight changes HEENT: see above Cardiovascular: negative for chest pain or palpitations Respiratory: negative for hemoptysis, wheezing, or shortness of breath Abdominal: negative for abdominal pain, nausea, vomiting or diarrhea Dermatological: negative for rash Neurologic: negative for headache   Physical Exam: Blood pressure 129/84, pulse 89, temperature 97.8 F (36.6 C), temperature source Oral, resp. rate 18, height 5\' 4"  (1.626 m), weight 238 lb (107.956 kg), SpO2 95.00%., Body mass index is 40.85 kg/(m^2). General: Well developed, well nourished, in no acute distress. Head: Normocephalic, atraumatic, eyes without discharge, sclera non-icteric, nares are patent. Bilateral auditory canals clear, TM's are without perforation, pearly grey with reflective cone of light bilaterally. No sinus TTP. Oral cavity moist, dentition normal. Posterior pharynx with post nasal drip and erythema. No peritonsillar abscess or tonsillar exudate. Uvula midline. Neck: Supple. No thyromegaly. Full ROM. < 2 cm AC. Lungs: Clear bilaterally to auscultation without wheezes, rales, or rhonchi. Breathing is unlabored. Heart: RRR with S1 S2. No murmurs, rubs, or gallops appreciated. Msk:  Strength and tone normal for age. Extremities: No clubbing or cyanosis.  No edema. Neuro: Alert and oriented X 3. Moves all extremities spontaneously. CNII-XII grossly in tact. Psych:  Responds to questions appropriately with a normal affect.   Labs: Results for orders placed in visit on 01/14/12  POCT RAPID STREP A (OFFICE)      Component Value Range   Rapid Strep A Screen Negative  Negative   TC pending  ASSESSMENT AND PLAN:  56 y.o. year old male with pharyngitis and cough -Amoxicillin 875 mg 1 po bid #20 no RF, cover  secondary to duration -Hycodan #4oz 1 tsp po q 4-6 hours prn cough no RF SED -Tylenol/Motrin prn -Rest/fluids -RTC precautions -RTC 3-5 days if no improvement  Signed, Eula Listen, PA-C 01/14/2012 9:03 AM

## 2012-01-16 LAB — CULTURE, GROUP A STREP

## 2012-01-18 ENCOUNTER — Ambulatory Visit: Payer: Federal, State, Local not specified - PPO

## 2012-01-18 ENCOUNTER — Ambulatory Visit (INDEPENDENT_AMBULATORY_CARE_PROVIDER_SITE_OTHER): Payer: Federal, State, Local not specified - PPO | Admitting: Family Medicine

## 2012-01-18 ENCOUNTER — Encounter: Payer: Self-pay | Admitting: Family Medicine

## 2012-01-18 VITALS — BP 118/82 | HR 80 | Temp 98.0°F | Resp 16 | Ht 65.0 in | Wt 245.0 lb

## 2012-01-18 DIAGNOSIS — E119 Type 2 diabetes mellitus without complications: Secondary | ICD-10-CM

## 2012-01-18 DIAGNOSIS — R059 Cough, unspecified: Secondary | ICD-10-CM

## 2012-01-18 DIAGNOSIS — R05 Cough: Secondary | ICD-10-CM

## 2012-01-18 DIAGNOSIS — J029 Acute pharyngitis, unspecified: Secondary | ICD-10-CM

## 2012-01-18 LAB — GLUCOSE, POCT (MANUAL RESULT ENTRY): POC Glucose: 114 mg/dl — AB (ref 70–99)

## 2012-01-18 LAB — POCT CBC
Granulocyte percent: 70.7 %G (ref 37–80)
HCT, POC: 49.8 % (ref 43.5–53.7)
Hemoglobin: 15.7 g/dL (ref 14.1–18.1)
MCV: 95.3 fL (ref 80–97)
POC LYMPH PERCENT: 21.6 %L (ref 10–50)
RBC: 5.23 M/uL (ref 4.69–6.13)
RDW, POC: 12.7 %

## 2012-01-18 MED ORDER — AZITHROMYCIN 250 MG PO TABS: ORAL_TABLET | ORAL | Status: DC

## 2012-01-18 MED ORDER — IBUPROFEN 600 MG PO TABS: 600.0000 mg | ORAL_TABLET | Freq: Three times a day (TID) | ORAL | Status: DC | PRN

## 2012-01-18 NOTE — Progress Notes (Signed)
[address]   [place], [place]  [address]   [phone]  Subjective:    Patient ID: Alejandro Brown, male    DOB: 1955/10/06, 56 y.o.   MRN: [id number]  HPIThis 56 y.o. male presents for evaluation of persistent cough, sore throat.  Evaluated four days ago; treated with Amoxicillin and cough medication.  No improvement.  Reports having the flu in September.  Onset of sore throat 2.5 weeks ago.  No fever/chills/sweats.  No headache.  No ear pain.  Sore throat diffuse; +pain with swallowing; no pain with talking or opening mouth.  +coughing a lot all day and all night.  Taking cough syrup 1 tablespoon four times per day.  Mild SOB.  No wheezing.  No v but mild diarrhea.  No fatigue/malaise.  S/p flu vaccine in 11/2011.  Checking sugars regularly but does not recall readings.  Mild rhinorrhea; +nasal congestion moderate; white or clear.   PCP: Dr. Saddie Benders  Review of Systems  Constitutional: Negative for fever, chills, diaphoresis and fatigue.  HENT: Positive for ear pain, congestion, sore throat, rhinorrhea, trouble swallowing and voice change. Negative for mouth sores and sinus pressure.   Respiratory: Positive for cough and shortness of breath. Negative for wheezing.   Gastrointestinal: Negative for nausea, vomiting, abdominal pain and diarrhea.        Past Medical History  Diagnosis Date  . Obesity   . Mental retardation     mild  . GERD (gastroesophageal reflux disease)   . Hypertension   . 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 ALLERGIC RHINITIS   . OBSTRUCTIVE SLEEP APNEA     noncompliant with CPAP  . Type II or unspecified type diabetes mellitus without mention of complication, not stated as uncontrolled     diet controlled    Past Surgical History  Procedure Date  . Nasal septum surgery     Prior to Admission medications   Medication Sig Start Date End Date Taking? Authorizing Provider  amoxicillin (AMOXIL) 875 MG tablet Take 1 tablet (875 mg total) by mouth 2 (two)  times daily. 01/14/12  Yes Ryan M Dunn, PA-C  furosemide (LASIX) 20 MG tablet Take 1 tablet (20 mg total) by mouth daily. 10/07/11  Yes Newt Lukes, MD  HYDROcodone-homatropine [facility]) 5-1.5 MG/5ML syrup 1 TSP PO Q 4-6 HOURS PRN COUGH 01/14/12  Yes Ryan M Dunn, PA-C  ramipril (ALTACE) 5 MG capsule Take 1 capsule (5 mg total) by mouth daily. 10/07/11  Yes Newt Lukes, MD    Allergies  Allergen Reactions  . Clarithromycin Nausea Only    History   Social History  . Marital Status: Single    Spouse Name: N/A    Number of Children: N/A  . Years of Education: N/A   Occupational History  . Not on file.   Social History Main Topics  . Smoking status: Never Smoker   . Smokeless tobacco: Never Used  . Alcohol Use: No  . Drug Use: No  . Sexually Active: Not on file   Other Topics Concern  . Not on file   Social History Narrative  . No narrative on file    Family History  Problem Relation Age of Onset  . Breast cancer Sister   . Breast cancer Other     Objective:   Physical Exam  Nursing note and vitals reviewed. Constitutional: He is oriented to person, place, and time. He appears well-developed and well-nourished. No distress.  HENT:  Head: Normocephalic and atraumatic.  Right Ear: External ear normal.  Left Ear: External ear normal.  Nose: Nose normal.  Mouth/Throat: Mucous membranes are normal. No oral lesions. No uvula swelling. Posterior oropharyngeal erythema present. No oropharyngeal exudate or tonsillar abscesses.  Neck: Normal range of motion. Neck supple. No thyromegaly present.  Cardiovascular: Normal rate, regular rhythm and normal heart sounds.   No murmur heard. Pulmonary/Chest: Effort normal and breath sounds normal. He has no wheezes. He has no rales.  Lymphadenopathy:    He has no cervical adenopathy.  Neurological: He is alert and oriented to person, place, and time.  Skin: Skin is warm and dry. He is not diaphoretic.  Psychiatric: He  has a normal mood and affect. His behavior is normal.     Results for orders placed in visit on 01/18/12  POCT CBC      Component Value Range   WBC 9.3  4.6 - 10.2 K/uL   Lymph, poc 2.0  0.6 - 3.4   POC LYMPH PERCENT 21.6  10 - 50 %L   MID (cbc) 0.7  0 - 0.9   POC MID % 7.7  0 - 12 %M   POC Granulocyte 6.6  2 - 6.9   Granulocyte percent 70.7  37 - 80 %G   RBC 5.23  4.69 - 6.13 M/uL   Hemoglobin 15.7  14.1 - 18.1 g/dL   HCT, POC 08.6  57.8 - 53.7 %   MCV 95.3  80 - 97 fL   MCH, POC 30.0  27 - 31.2 pg   MCHC 31.5 (*) 31.8 - 35.4 g/dL   RDW, POC 46.9     Platelet Count, POC 316  142 - 424 K/uL   MPV 10.2  0 - 99.8 fL  GLUCOSE, POCT (MANUAL RESULT ENTRY)      Component Value Range   POC Glucose 114 (*) 70 - 99 mg/dl      [facility] reading (PRIMARY) by  Dr. Katrinka Blazing.  CXR:  +Small RML infiltrate.  Radiology overread: NAD     Assessment & Plan:   1. Cough  Epstein-Barr virus VCA antibody panel, POCT CBC, DG Chest 2 View, POCT glucose (manual entry), azithromycin (ZITHROMAX Z-PAK) 250 MG tablet  2. Acute pharyngitis  Epstein-Barr virus VCA antibody panel, POCT CBC, DG Chest 2 View, POCT glucose (manual entry), ibuprofen (ADVIL,MOTRIN) 600 MG tablet  3. DM type 2 (diabetes mellitus, type 2)  Epstein-Barr virus VCA antibody panel, POCT CBC, DG Chest 2 View, POCT glucose (manual entry)  4. Mental Retardation   1.  Pharyngitis:  Persistent for past two weeks; obtain EBV titers.  Continue Amoxicillin until titers return.  Rx for Ibuprofen 600mg  tid provided to use PRN sore throat. 2.  Cough:  Persistent; CXR negative; add Zithromax for atypical coverage due to duration and age and DMII.  Continue cough syrup. 3.  DMII: controlled. 3. Mental Retardation: Stable; expresses understanding of treatment plan.

## 2012-01-18 NOTE — Patient Instructions (Addendum)
1. Cough  Epstein-Barr virus VCA antibody panel, POCT CBC, DG Chest 2 View, POCT glucose (manual entry), azithromycin (ZITHROMAX Z-PAK) 250 MG tablet  2. Acute pharyngitis  Epstein-Barr virus VCA antibody panel, POCT CBC, DG Chest 2 View, POCT glucose (manual entry), ibuprofen (ADVIL,MOTRIN) 600 MG tablet  3. DM type 2 (diabetes mellitus, type 2)  Epstein-Barr virus VCA antibody panel, POCT CBC, DG Chest 2 View, POCT glucose (manual entry)

## 2012-01-19 LAB — EPSTEIN-BARR VIRUS VCA ANTIBODY PANEL
EBV EA IgG: <5 U/mL (ref <9.0)
EBV NA IgG: <3 U/mL (ref <18.0)
EBV VCA IgM: <10 U/mL (ref <36.0)

## 2012-04-06 ENCOUNTER — Encounter: Payer: Self-pay | Admitting: Internal Medicine

## 2012-04-06 ENCOUNTER — Other Ambulatory Visit (INDEPENDENT_AMBULATORY_CARE_PROVIDER_SITE_OTHER): Payer: Federal, State, Local not specified - PPO

## 2012-04-06 ENCOUNTER — Ambulatory Visit (INDEPENDENT_AMBULATORY_CARE_PROVIDER_SITE_OTHER): Payer: Federal, State, Local not specified - PPO | Admitting: Internal Medicine

## 2012-04-06 VITALS — BP 122/82 | HR 94 | Temp 97.6°F | Resp 10 | Wt 242.8 lb

## 2012-04-06 DIAGNOSIS — E669 Obesity, unspecified: Secondary | ICD-10-CM

## 2012-04-06 DIAGNOSIS — I10 Essential (primary) hypertension: Secondary | ICD-10-CM

## 2012-04-06 DIAGNOSIS — E119 Type 2 diabetes mellitus without complications: Secondary | ICD-10-CM

## 2012-04-06 NOTE — Assessment & Plan Note (Signed)
BP Readings from Last 3 Encounters:  04/06/12 122/82  01/18/12 118/82  01/14/12 129/84   On ramipril The current medical regimen is effective;  continue present plan and medications.

## 2012-04-06 NOTE — Patient Instructions (Addendum)
It was good to see you today. Medications reviewed,  no changes at this time. Test(s) ordered today. Your results will be released to MyChart (or called to you) after review, usually within 72hours after test completion. If any changes need to be made, you will be notified at that same time. Work on lifestyle changes as discussed (low fat, low carb, increased protein diet; improved exercise efforts; weight loss) to control sugar, blood pressure and cholesterol levels and/or reduce risk of developing other medical problems. Please schedule followup in 6 months for physical and labs, call sooner if problems.

## 2012-04-06 NOTE — Progress Notes (Signed)
  Subjective:    Patient ID: Alejandro Brown, male    DOB: 1955/11/20, 57 y.o.   MRN: [id number]  HPI  here for follow up - reviewed chronic medical issues:  DM2 = prev rx'd metformin but noncompliant with same- report adverse side effects on metformin (diarrhea), irregular home monitoring of cbgs (sister in law now helping pt to attend to same since pt's mom passed 08/20/2010)  Chronic edema - L>R, uses compression stockings and request rx for new pain - on daily diuretics but noncompliant with KCl due to size of tabs  OSA - noncompliant with CPAP due to discomfort of machine -   Mild MR, developmental delay - "deciving" per sister in law - has worked at [employer] for >18 years - lived with mom until her death August 20, 2010 - now alone but sister in law helping pt to attend to medical issues   Past Medical History  Diagnosis Date  . Obesity   . Mental retardation     mild  . GERD (gastroesophageal reflux disease)   . Hypertension   . 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 ALLERGIC RHINITIS   . OBSTRUCTIVE SLEEP APNEA     noncompliant with CPAP  . Type II or unspecified type diabetes mellitus without mention of complication, not stated as uncontrolled     diet controlled     Review of Systems  Constitutional: Negative for fever, activity change, appetite change and unexpected weight change.  Respiratory: Negative for cough and shortness of breath.   Cardiovascular: Negative for chest pain and palpitations.       Objective:   Physical Exam  BP 122/82  Pulse 94  Temp 97.6 F (36.4 C) (Oral)  Resp 10  Wt 242 lb 12.8 oz (110.133 kg)  SpO2 95% Wt Readings from Last 3 Encounters:  04/06/12 242 lb 12.8 oz (110.133 kg)  01/18/12 245 lb (111.131 kg)  01/14/12 238 lb (107.956 kg)   Constitutional:  He is overweight, but appears well-developed and well-nourished. No distress. Sister in Editor, commissioning at side Neck: Thick; Normal range of motion. Neck supple. No JVD present. No thyromegaly present.   Cardiovascular: Normal rate, regular rhythm and normal heart sounds.  No murmur heard. chronic trace BLE edema, L>R chronic - wearing compression stocking on LLE - Pulmonary/Chest: Effort normal and breath sounds normal. No respiratory distress. no wheezes.  Neurological: he is alert and oriented to person, place, and time. No cranial nerve deficit. Coordination normal.  Skin: Skin is warm and dry.  No erythema or ulceration. hair loss distal legs Psychiatric: he has a normal mood and affect. behavior is normal. Judgment and thought content normal.   Lab Results  Component Value Date   WBC 9.3 01/18/2012   HGB 15.7 01/18/2012   HCT 49.8 01/18/2012   PLT 259 08/19/2010   GLUCOSE 108* 10/07/2011   CHOL 172 10/07/2011   TRIG 165.0* 10/07/2011   HDL 44.50 10/07/2011   LDLCALC 95 10/07/2011   ALT 21 08/19/2010   AST 17 08/19/2010   NA 141 10/07/2011   K 4.8 10/07/2011   CL 106 10/07/2011   CREATININE 1.3 10/07/2011   BUN 15 10/07/2011   CO2 29 10/07/2011   PSA 2.48 08/19/2010   HGBA1C 6.1 10/07/2011      Assessment & Plan:  See problem list. Medications and labs reviewed today.

## 2012-04-06 NOTE — Assessment & Plan Note (Signed)
Diet controlled, weight trends reviewed Hx unable to tolerate metformin due to GI side effects  Managing meds and home cbgs complicated by mild MR Check a1c now - consider actos or other medication if needed Lab Results  Component Value Date   HGBA1C 6.1 10/07/2011

## 2012-04-06 NOTE — Progress Notes (Addendum)
Subjective:    Patient ID: Alejandro Brown, male    DOB: [birth year], 57 y.o.   MRN: [id number]  HPI Here for follow-up of chronic medical issues. No other complaints currently. DM- Pt previously on metformin but was discontinued due to side effect of diarrhea. Currently maintained on diet and some exercise.  Pt relays fairly healthy diet and avoidance of sweets.  Pt denies s/sx of hypo/hyperglycemia.  Pt not currently checking blood sugars at home.  HTN- Pt reports some infrequent monitoring of BP at home but good compliance with ramipril 5mg  daily.  Pt denies episodes of chest pain, headache, and cough.  Pt does have long standing lower extremity edema.  Obesity- Pt unaware of 10 lb weight gain over the last year but does report less strict diet than prior year and reduced activity level.   Lower extremity edema- Pt report compliance with furosemide dosing.  Left leg more edematous than right.  Pt wears compression stockings bilaterally as needed, today on left side only.  Pt denies pain and tenderness of lower legs.   Past Medical History  Diagnosis Date  . Obesity   . Mental retardation     mild  . GERD (gastroesophageal reflux disease)   . Hypertension   . 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 ALLERGIC RHINITIS   . OBSTRUCTIVE SLEEP APNEA     noncompliant with CPAP  . Type II or unspecified type diabetes mellitus without mention of complication, not stated as uncontrolled     diet controlled    Review of Systems  Constitutional: Negative for fever and fatigue.  Respiratory: Negative for cough and shortness of breath.   Cardiovascular: Positive for leg swelling. Negative for chest pain.       See HPI.  Gastrointestinal: Negative for abdominal pain, diarrhea and constipation.  Neurological: Negative for syncope, weakness and headaches.       Objective:   Physical Exam  Constitutional: He is oriented to person, place, and time. He appears well-developed and well-nourished. No  distress.  Neck: No JVD present. No thyromegaly present.  Cardiovascular: Normal rate, regular rhythm, normal heart sounds and intact distal pulses.        +2 edema of bilateral lower extremities.  L>R.  Compression stocking present on LLE.  Pulmonary/Chest: Effort normal and breath sounds normal. No respiratory distress.  Abdominal: Soft. Bowel sounds are normal. There is no tenderness.  Neurological: He is alert and oriented to person, place, and time.  Skin: Skin is warm and dry.  Psychiatric: He has a normal mood and affect. Thought content normal.   BP 122/82  Pulse 94  Temp 97.6 F (36.4 C) (Oral)  Resp 10  Wt 242 lb 12.8 oz (110.133 kg)  SpO2 95%  Lab Results  Component Value Date   WBC 9.3 01/18/2012   HGB 15.7 01/18/2012   HCT 49.8 01/18/2012   PLT 259 08/19/2010   GLUCOSE 108* 10/07/2011   CHOL 172 10/07/2011   TRIG 165.0* 10/07/2011   HDL 44.50 10/07/2011   LDLCALC 95 10/07/2011   ALT 21 08/19/2010   AST 17 08/19/2010   NA 141 10/07/2011   K 4.8 10/07/2011   CL 106 10/07/2011   CREATININE 1.3 10/07/2011   BUN 15 10/07/2011   CO2 29 10/07/2011   PSA 2.48 08/19/2010   HGBA1C 6.1 04/06/2012       Assessment & Plan:   DM- Pt educated on diabetic diet, food choices, and portion control.  Will check HgbA1c today and follow every 6 mos to monitor.  If elevated will consider starting new med.  HTN- Pt complaint with current med regimen.  Well controlled and no changes required currently.  Will continue to monitor at future OV.  Obesity- Pt educated on diet and portion control an informed of 10 lb weight gain.  Advised to begin exercise routine along with diet changes.  Will continue to monitor.  LE edema- Pt complaint with furosemide.  LE edema currently with no complications/ complaints.  Managed well with furosemide and compression stockings. No changes required.  Follow-up appt scheduled for 6 months or as needed. Pt advised to call the office with any concerns or  needs.  Greater than 50% of time for visit spent counseling patient on medication, diet and stress.

## 2012-04-06 NOTE — Assessment & Plan Note (Signed)
Wt Readings from Last 3 Encounters:  04/06/12 242 lb 12.8 oz (110.133 kg)  01/18/12 245 lb (111.131 kg)  01/14/12 238 lb (107.956 kg)  weight trends reviewed The patient is asked to make an attempt to improve diet and exercise patterns to aid in medical management of this problem.

## 2012-09-28 ENCOUNTER — Other Ambulatory Visit (INDEPENDENT_AMBULATORY_CARE_PROVIDER_SITE_OTHER): Payer: Federal, State, Local not specified - PPO

## 2012-09-28 ENCOUNTER — Telehealth: Payer: Self-pay

## 2012-09-28 DIAGNOSIS — Z0000 Encounter for general adult medical examination without abnormal findings: Secondary | ICD-10-CM

## 2012-09-28 LAB — CBC WITH DIFFERENTIAL/PLATELET
Basophils Relative: 0.4 % (ref 0.0–3.0)
Eosinophils Absolute: 0.3 10*3/uL (ref 0.0–0.7)
HCT: 45.7 % (ref 39.0–52.0)
Hemoglobin: 15.3 g/dL (ref 13.0–17.0)
Lymphocytes Relative: 26.1 % (ref 12.0–46.0)
MCHC: 33.6 g/dL (ref 30.0–36.0)
Monocytes Relative: 8 % (ref 3.0–12.0)
Neutro Abs: 4.3 10*3/uL (ref 1.4–7.7)
RBC: 4.95 Mil/uL (ref 4.22–5.81)

## 2012-09-28 LAB — URINALYSIS, ROUTINE W REFLEX MICROSCOPIC
Bilirubin Urine: NEGATIVE
Hgb urine dipstick: NEGATIVE
Ketones, ur: NEGATIVE
Leukocytes, UA: NEGATIVE
RBC / HPF: NONE SEEN (ref 0–?)
Specific Gravity, Urine: >=1.03 (ref 1.000–1.030)
Urobilinogen, UA: 0.2 (ref 0.0–1.0)

## 2012-09-28 LAB — BASIC METABOLIC PANEL
BUN: 11 mg/dL (ref 6–23)
CO2: 29 mEq/L (ref 19–32)
Chloride: 108 mEq/L (ref 96–112)
Creatinine, Ser: 1.1 mg/dL (ref 0.4–1.5)

## 2012-09-28 LAB — LIPID PANEL
LDL Cholesterol: 110 mg/dL — ABNORMAL HIGH (ref 0–99)
Total CHOL/HDL Ratio: 4
VLDL: 10.4 mg/dL (ref 0.0–40.0)

## 2012-09-28 LAB — PSA: PSA: 3.77 ng/mL (ref 0.10–4.00)

## 2012-09-28 LAB — HEPATIC FUNCTION PANEL
ALT: 23 U/L (ref 0–53)
Total Protein: 6.4 g/dL (ref 6.0–8.3)

## 2012-09-28 LAB — TSH: TSH: 0.9 u[IU]/mL (ref 0.35–5.50)

## 2012-09-28 NOTE — Telephone Encounter (Signed)
Labs entered.

## 2012-10-05 ENCOUNTER — Ambulatory Visit (INDEPENDENT_AMBULATORY_CARE_PROVIDER_SITE_OTHER): Payer: Federal, State, Local not specified - PPO | Admitting: Internal Medicine

## 2012-10-05 ENCOUNTER — Other Ambulatory Visit (INDEPENDENT_AMBULATORY_CARE_PROVIDER_SITE_OTHER): Payer: Federal, State, Local not specified - PPO

## 2012-10-05 ENCOUNTER — Encounter: Payer: Self-pay | Admitting: Internal Medicine

## 2012-10-05 VITALS — BP 120/74 | HR 83 | Temp 98.3°F | Wt 237.8 lb

## 2012-10-05 DIAGNOSIS — Z0000 Encounter for general adult medical examination without abnormal findings: Secondary | ICD-10-CM

## 2012-10-05 DIAGNOSIS — Z1211 Encounter for screening for malignant neoplasm of colon: Secondary | ICD-10-CM

## 2012-10-05 DIAGNOSIS — R609 Edema, unspecified: Secondary | ICD-10-CM

## 2012-10-05 DIAGNOSIS — E119 Type 2 diabetes mellitus without complications: Secondary | ICD-10-CM

## 2012-10-05 DIAGNOSIS — I10 Essential (primary) hypertension: Secondary | ICD-10-CM

## 2012-10-05 DIAGNOSIS — E669 Obesity, unspecified: Secondary | ICD-10-CM

## 2012-10-05 LAB — HEMOGLOBIN A1C: Hgb A1c MFr Bld: 6.2 % (ref 4.6–6.5)

## 2012-10-05 MED ORDER — FUROSEMIDE 20 MG PO TABS: 20.0000 mg | ORAL_TABLET | Freq: Every day | ORAL | Status: DC

## 2012-10-05 MED ORDER — RAMIPRIL 5 MG PO CAPS: 5.0000 mg | ORAL_CAPSULE | Freq: Every day | ORAL | Status: DC

## 2012-10-05 NOTE — Assessment & Plan Note (Signed)
Wt Readings from Last 3 Encounters:  10/05/12 237 lb 12.8 oz (107.865 kg)  04/06/12 242 lb 12.8 oz (110.133 kg)  01/18/12 245 lb (111.131 kg)  weight trends reviewed The patient is asked to make an attempt to improve diet and exercise patterns to aid in medical management of this problem.

## 2012-10-05 NOTE — Assessment & Plan Note (Signed)
BP Readings from Last 3 Encounters:  10/05/12 120/74  04/06/12 122/82  01/18/12 118/82   On ramipril The current medical regimen is effective;  continue present plan and medications.

## 2012-10-05 NOTE — Assessment & Plan Note (Signed)
Diet controlled, weight trends reviewed Hx unable to tolerate metformin due to GI side effects  Managing meds and home cbgs complicated by mild MR  Check a1c now - consider other medication if needed   Lab Results  Component Value Date   HGBA1C 6.1 04/06/2012

## 2012-10-05 NOTE — Patient Instructions (Signed)
It was good to see you today. We have reviewed your prior records including labs and tests today Health Maintenance reviewed - will refer for colon screening -all other recommended immunizations and age-appropriate screenings are up-to-date.  Medications reviewed,  no changes at this time. Test(s) ordered today. Your results will be released to MyChart (or called to you) after review, usually within 72hours after test completion. If any changes need to be made, you will be notified at that same time. Continue to work on lifestyle changes as discussed (low fat, low carb, increased protein diet; improved exercise efforts; weight loss) to control sugar, blood pressure and cholesterol levels and/or reduce risk of developing other medical problems. Please schedule followup in 6 months for diabetes check, call sooner if problems.   Health Maintenance, Males A healthy lifestyle and preventative care can promote health and wellness.  Maintain regular health, dental, and eye exams.  Eat a healthy diet. Foods like vegetables, fruits, whole grains, low-fat dairy products, and lean protein foods contain the nutrients you need without too many calories. Decrease your intake of foods high in solid fats, added sugars, and salt. Get information about a proper diet from your caregiver, if necessary.  Regular physical exercise is one of the most important things you can do for your health. Most adults should get at least 150 minutes of moderate-intensity exercise (any activity that increases your heart rate and causes you to sweat) each week. In addition, most adults need muscle-strengthening exercises on 2 or more days a week.   Maintain a healthy weight. The body mass index (BMI) is a screening tool to identify possible weight problems. It provides an estimate of body fat based on height and weight. Your caregiver can help determine your BMI, and can help you achieve or maintain a healthy weight. For adults 20  years and older:  A BMI below 18.5 is considered underweight.  A BMI of 18.5 to 24.9 is normal.  A BMI of 25 to 29.9 is considered overweight.  A BMI of 30 and above is considered obese.  Maintain normal blood lipids and cholesterol by exercising and minimizing your intake of saturated fat. Eat a balanced diet with plenty of fruits and vegetables. Blood tests for lipids and cholesterol should begin at age 34 and be repeated every 5 years. If your lipid or cholesterol levels are high, you are over 50, or you are a high risk for heart disease, you may need your cholesterol levels checked more frequently.Ongoing high lipid and cholesterol levels should be treated with medicines, if diet and exercise are not effective.  If you smoke, find out from your caregiver how to quit. If you do not use tobacco, do not start.  If you choose to drink alcohol, do not exceed 2 drinks per day. One drink is considered to be 12 ounces (355 mL) of beer, 5 ounces (148 mL) of wine, or 1.5 ounces (44 mL) of liquor.  Avoid use of street drugs. Do not share needles with anyone. Ask for help if you need support or instructions about stopping the use of drugs.  High blood pressure causes heart disease and increases the risk of stroke. Blood pressure should be checked at least every 1 to 2 years. Ongoing high blood pressure should be treated with medicines if weight loss and exercise are not effective.  If you are 32 to 59 years old, ask your caregiver if you should take aspirin to prevent heart disease.  Diabetes screening involves taking  a blood sample to check your fasting blood sugar level. This should be done once every 3 years, after age 10, if you are within normal weight and without risk factors for diabetes. Testing should be considered at a younger age or be carried out more frequently if you are overweight and have at least 1 risk factor for diabetes.  Colorectal cancer can be detected and often prevented.  Most routine colorectal cancer screening begins at the age of 55 and continues through age 69. However, your caregiver may recommend screening at an earlier age if you have risk factors for colon cancer. On a yearly basis, your caregiver may provide home test kits to check for hidden blood in the stool. Use of a small camera at the end of a tube, to directly examine the colon (sigmoidoscopy or colonoscopy), can detect the earliest forms of colorectal cancer. Talk to your caregiver about this at age 69, when routine screening begins. Direct examination of the colon should be repeated every 5 to 10 years through age 52, unless early forms of pre-cancerous polyps or small growths are found.  Hepatitis C blood testing is recommended for all people born from [birth year] through 1967 and any individual with known risks for hepatitis C.  Healthy men should no longer receive prostate-specific antigen (PSA) blood tests as part of routine cancer screening. Consult with your caregiver about prostate cancer screening.  Testicular cancer screening is not recommended for adolescents or adult males who have no symptoms. Screening includes self-exam, caregiver exam, and other screening tests. Consult with your caregiver about any symptoms you have or any concerns you have about testicular cancer.  Practice safe sex. Use condoms and avoid high-risk sexual practices to reduce the spread of sexually transmitted infections (STIs).  Use sunscreen with a sun protection factor (SPF) of 30 or greater. Apply sunscreen liberally and repeatedly throughout the day. You should seek shade when your shadow is shorter than you. Protect yourself by wearing long sleeves, pants, a wide-brimmed hat, and sunglasses year round, whenever you are outdoors.  Notify your caregiver of new moles or changes in moles, especially if there is a change in shape or color. Also notify your caregiver if a mole is larger than the size of a pencil eraser.  A  one-time screening for abdominal aortic aneurysm (AAA) and surgical repair of large AAAs by sound wave imaging (ultrasonography) is recommended for ages 27 to 1 years who are current or former smokers.  Stay current with your immunizations. Document Released: 09/03/2007 Document Revised: 05/30/2011 Document Reviewed: 08/02/2010 [facility] Patient Information 2014 [place], [place].

## 2012-10-05 NOTE — Assessment & Plan Note (Signed)
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

## 2012-10-05 NOTE — Progress Notes (Signed)
Subjective:    Patient ID: Alejandro Brown, male    DOB: [birth year], 57 y.o.   MRN: [id number]  HPI  patient is here today for annual physical. Patient feels well overall.  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 Obesity   . Mental retardation     mild  . GERD (gastroesophageal reflux disease)   . Hypertension   . 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 ALLERGIC RHINITIS   . OBSTRUCTIVE SLEEP APNEA     noncompliant with CPAP  . Type II or unspecified type diabetes mellitus without mention of complication, not stated as uncontrolled     diet controlled   Family History  Problem Relation Age of Onset  . Breast cancer Sister   . Breast cancer Other    History  Substance Use Topics  . Smoking status: Never Smoker   . Smokeless tobacco: Never Used  . Alcohol Use: No    Review of Systems  Constitutional: Negative for fever, activity change, appetite change and unexpected weight change.  Respiratory: Negative for cough and shortness of breath.   Cardiovascular: Negative for chest pain and palpitations.  No other specific complaints in a complete review of systems (except as listed in HPI above).      Objective:   Physical Exam  BP 120/74  Pulse 83  Temp(Src) 98.3 F (36.8 C) (Oral)  Wt 237 lb 12.8 oz (107.865  kg)  BMI 39.57 kg/m2  SpO2 97% Wt Readings from Last 3 Encounters:  10/05/12 237 lb 12.8 oz (107.865 kg)  04/06/12 242 lb 12.8 oz (110.133 kg)  01/18/12 245 lb (111.131 kg)   Constitutional:  He is overweight, but appears well-developed and well-nourished. No distress. Sister in Editor, commissioning at side Neck: Thick; Normal range of motion. Neck supple. No JVD present. No thyromegaly present.  Cardiovascular: Normal rate, regular rhythm and normal heart sounds.  No murmur heard. chronic trace BLE edema, L>R chronic - wearing compression stocking on LLE - Pulmonary/Chest: Effort normal and breath sounds normal. No respiratory distress. no wheezes.  Neurological: he is alert and oriented to person, place, and time. No cranial nerve deficit. Coordination normal.  Skin: Skin is warm and dry.  No erythema or ulceration. hair loss distal legs Psychiatric: he has a normal mood and affect. behavior is normal. Judgment and thought content normal.   Lab Results  Component Value Date   WBC 7.1 09/28/2012   HGB 15.3 09/28/2012   HCT 45.7 09/28/2012   PLT 246.0 09/28/2012   GLUCOSE 109* 09/28/2012   CHOL 160 09/28/2012   TRIG 52.0 09/28/2012   HDL 39.50 09/28/2012   LDLCALC 110* 09/28/2012   ALT 23 09/28/2012   AST 17 09/28/2012   NA 141 09/28/2012  K 5.0 09/28/2012   CL 108 09/28/2012   CREATININE 1.1 09/28/2012   BUN 11 09/28/2012   CO2 29 09/28/2012   TSH 0.90 09/28/2012   PSA 3.77 09/28/2012   HGBA1C 6.1 04/06/2012      Assessment & Plan:   CPX/v70.0 - Patient has been counseled on age-appropriate routine health concerns for screening and prevention. These are reviewed and up-to-date. Immunizations are up-to-date or declined. Labs ordered and reviewed.  Also see problem list. Medications and labs reviewed today.

## 2012-10-11 ENCOUNTER — Encounter: Payer: Self-pay | Admitting: Internal Medicine

## 2012-11-09 DIAGNOSIS — Z0279 Encounter for issue of other medical certificate: Secondary | ICD-10-CM

## 2012-12-21 ENCOUNTER — Ambulatory Visit (AMBULATORY_SURGERY_CENTER): Payer: Self-pay | Admitting: *Deleted

## 2012-12-21 VITALS — Ht 65.0 in | Wt 245.8 lb

## 2012-12-21 DIAGNOSIS — Z1211 Encounter for screening for malignant neoplasm of colon: Secondary | ICD-10-CM

## 2012-12-21 MED ORDER — MOVIPREP 100 G PO SOLR: ORAL | Status: DC

## 2012-12-21 NOTE — Progress Notes (Signed)
No allergies to eggs or soy. No problems with anesthesia.  Sister in law: Azeem Poorman with pt in [facility] and says pt signs all his own legal papers.  Pt is able to verbalize why he is having procedure.

## 2012-12-26 ENCOUNTER — Telehealth: Payer: Self-pay | Admitting: *Deleted

## 2012-12-26 NOTE — Telephone Encounter (Signed)
Pt's wife came in requesting a letter for pt to give to [employer] for his upcoming procedure; wrote a letter for pt.

## 2012-12-28 ENCOUNTER — Encounter: Payer: Self-pay | Admitting: Internal Medicine

## 2012-12-28 LAB — HM DIABETES EYE EXAM

## 2012-12-29 ENCOUNTER — Encounter: Payer: Self-pay | Admitting: Internal Medicine

## 2013-01-04 ENCOUNTER — Encounter: Payer: Self-pay | Admitting: Internal Medicine

## 2013-01-04 ENCOUNTER — Ambulatory Visit (AMBULATORY_SURGERY_CENTER): Payer: Federal, State, Local not specified - PPO | Admitting: Internal Medicine

## 2013-01-04 VITALS — BP 135/84 | HR 77 | Temp 98.5°F | Resp 20 | Ht 65.0 in | Wt 245.0 lb

## 2013-01-04 DIAGNOSIS — D126 Benign neoplasm of colon, unspecified: Secondary | ICD-10-CM

## 2013-01-04 DIAGNOSIS — Z1211 Encounter for screening for malignant neoplasm of colon: Secondary | ICD-10-CM

## 2013-01-04 MED ORDER — SODIUM CHLORIDE 0.9 % IV SOLN: 500.0000 mL | INTRAVENOUS | Status: DC

## 2013-01-04 NOTE — Patient Instructions (Signed)

## 2013-01-04 NOTE — Progress Notes (Signed)
Report to pacu rn, vss, bbs=clear 

## 2013-01-04 NOTE — Op Note (Signed)
[facility] [address] [place], [address]   COLONOSCOPY PROCEDURE REPORT  PATIENT: [name], [name]  MR#: [id number] BIRTHDATE: March 02, 1956 , 56  yrs. old GENDER: Male ENDOSCOPIST: Beverley Fiedler, MD REFERRED WN:[id number] Felicity Coyer, M.D. PROCEDURE DATE:  01/04/2013 PROCEDURE:   Colonoscopy with snare polypectomy First Screening Colonoscopy - Avg.  risk and is 50 yrs.  old or older Yes.  Prior Negative Screening - Now for repeat screening. N/A  History of Adenoma - Now for follow-up colonoscopy & has been > or = to 3 yrs.  N/A  Polyps Removed Today? Yes. ASA CLASS:   Class II INDICATIONS:average risk screening and first colonoscopy. MEDICATIONS: MAC sedation, administered by CRNA and propofol (Diprivan) 300mg  IV  DESCRIPTION OF PROCEDURE:   After the risks benefits and alternatives of the procedure were thoroughly explained, informed consent was obtained.  A digital rectal exam revealed no rectal mass.   The LB [id number]  endoscope was introduced through the anus and advanced to the cecum, which was identified by both the appendix and ileocecal valve. No adverse events experienced. The quality of the prep was good, using MoviPrep  The instrument was then slowly withdrawn as the colon was fully examined.    COLON FINDINGS: Seven sessile polyps ranging between 3-91mm in size were found in the transverse colon, descending colon, and sigmoid colon.  Polypectomy was performed using cold snare.  All resections were complete and all polyp tissue was completely retrieved. There was mild scattered diverticulosis noted in the left colon. Retroflexed views revealed internal hemorrhoids. The time to cecum=3 minutes 35 seconds.  Withdrawal time=15 minutes 43 seconds. The scope was withdrawn and the procedure completed.  COMPLICATIONS: There were no complications.     ENDOSCOPIC IMPRESSION: 1.   Seven sessile polyps ranging between 3-67mm in size were found in  the transverse colon, descending colon, and sigmoid colon; Polypectomy was performed using cold snare 2.   There was mild diverticulosis noted in the left colon  RECOMMENDATIONS: 1.  Avoid all NSAIDS for the next 2 weeks. 2.  Await pathology results 3.  If the polyps removed today are proven to be adenomatous (pre-cancerous) polyps, you will need a colonoscopy in 3 years. Otherwise you should continue to follow colorectal cancer screening guidelines for "routine risk" patients with a colonoscopy in 10 years.  You will receive a letter within 1-2 weeks with the results of your biopsy as well as final recommendations.  Please call my office if you have not received a letter after 3 weeks.   eSigned:  Beverley Fiedler, MD 01/04/2013 11:40 AM   cc: The Patient and Newt Lukes, MD   PATIENT NAME:  Alejandro Brown, Alejandro Brown MR#: [id number]

## 2013-01-04 NOTE — Progress Notes (Signed)
Patient did not have preoperative order for IV antibiotic SSI prophylaxis. ([id number])   

## 2013-01-04 NOTE — Progress Notes (Signed)
Called to room to assist during endoscopic procedure.  Patient ID and intended procedure confirmed with present staff. Received instructions for my participation in the procedure from the performing physician.  

## 2013-01-04 NOTE — Progress Notes (Signed)
No egg or soy allergy. ewm 

## 2013-01-07 ENCOUNTER — Telehealth: Payer: Self-pay | Admitting: *Deleted

## 2013-01-07 NOTE — Telephone Encounter (Signed)
  Follow up Call-  Call back number 01/04/2013  Post procedure Call Back phone  # [phone]  Permission to leave phone message Yes     Patient questions:  Do you have a fever, pain , or abdominal swelling? no Pain Score  0 *  Have you tolerated food without any problems? yes  Have you been able to return to your normal activities? yes  Do you have any questions about your discharge instructions: Diet   no Medications  no Follow up visit  no  Do you have questions or concerns about your Care? no  Actions: * If pain score is 4 or above: No action needed, pain <4.

## 2013-01-09 ENCOUNTER — Encounter: Payer: Self-pay | Admitting: Internal Medicine

## 2013-01-14 ENCOUNTER — Telehealth: Payer: Self-pay | Admitting: Internal Medicine

## 2013-01-14 DIAGNOSIS — R609 Edema, unspecified: Secondary | ICD-10-CM

## 2013-01-14 DIAGNOSIS — E669 Obesity, unspecified: Secondary | ICD-10-CM

## 2013-01-14 DIAGNOSIS — G4733 Obstructive sleep apnea (adult) (pediatric): Secondary | ICD-10-CM

## 2013-01-14 NOTE — Telephone Encounter (Signed)
rx printed from [facility] - please fax and let family know when done thanks

## 2013-01-14 NOTE — Telephone Encounter (Signed)
Patient is requesting a RX for stocking for leg sent to [facility].

## 2013-01-15 NOTE — Telephone Encounter (Signed)
Called pt spoke with his sister in law (POA) wanting rx fax to [facility]. Fax to [phone]...lmb

## 2013-02-11 ENCOUNTER — Ambulatory Visit (INDEPENDENT_AMBULATORY_CARE_PROVIDER_SITE_OTHER): Payer: Federal, State, Local not specified - PPO | Admitting: Family Medicine

## 2013-02-11 ENCOUNTER — Ambulatory Visit: Payer: Federal, State, Local not specified - PPO

## 2013-02-11 VITALS — BP 118/74 | HR 94 | Temp 98.7°F | Resp 20 | Ht 63.0 in | Wt 240.0 lb

## 2013-02-11 DIAGNOSIS — M542 Cervicalgia: Secondary | ICD-10-CM

## 2013-02-11 DIAGNOSIS — M47812 Spondylosis without myelopathy or radiculopathy, cervical region: Secondary | ICD-10-CM

## 2013-02-11 MED ORDER — DICLOFENAC SODIUM 75 MG PO TBEC: DELAYED_RELEASE_TABLET | ORAL | Status: DC

## 2013-02-11 MED ORDER — HYDROCODONE-ACETAMINOPHEN 5-325 MG PO TABS: ORAL_TABLET | ORAL | Status: DC

## 2013-02-11 MED ORDER — METHOCARBAMOL 750 MG PO TABS: ORAL_TABLET | ORAL | Status: DC

## 2013-02-11 NOTE — Patient Instructions (Signed)
Take the muscle relaxants, methocarbamol, one in the morning, one in the afternoon, and 2 at bedtime for muscle relaxant  Take the diclofenac one twice daily with breakfast and supper for pain and inflammation  Take the hydrocodone one every 6 hours only if needed for severe pain. Do not use regularly. It may cause drowsiness, so I would advise against using it when you're going to work.

## 2013-02-11 NOTE — Progress Notes (Signed)
Subjective: 57 year old man with history of about one month of having pain on the right side of his neck. He's not been able to get any good relief from it. He continues to hurt. No initial inciting cause known. 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 Has not taken any medications for this for recently. Does not have any long-term history of neck pain problems prior to this.  Objective: 57 year old man who is moderately overweight. Alert and oriented. He is neck is only mildly tender to palpation in the right side of the neck and he'll do deep palpation which hurts more. However he has a great deal of pain on lateral rotational, tilting, and extension of the neck. Flexion of the neck does not seem to hurt a lot. Motor strength in the upper extremities is good. He has full range of motion of the shoulders. No radiculopathy.  Assessment: Cervical pain, etiology unclear  Plan: C-spine x-ray  [facility] reading (PRIMARY) by  Dr. Alwyn Ren Mild arthritic changes but no acute bony abnormalities or excessively narrowed disc spaces noted.  . patient has a very short neck. His muscles are tight. He does not appear to have any major disc type problems and no neurologic signs. We will treat with muscle relaxants and anti-inflammatory pain relievers, as well as giving him some sharp pain medications to use if necessary.

## 2013-04-12 ENCOUNTER — Ambulatory Visit: Payer: Federal, State, Local not specified - PPO | Admitting: Internal Medicine

## 2013-04-19 ENCOUNTER — Ambulatory Visit (INDEPENDENT_AMBULATORY_CARE_PROVIDER_SITE_OTHER): Payer: Federal, State, Local not specified - PPO | Admitting: Internal Medicine

## 2013-04-19 ENCOUNTER — Other Ambulatory Visit (INDEPENDENT_AMBULATORY_CARE_PROVIDER_SITE_OTHER): Payer: Federal, State, Local not specified - PPO

## 2013-04-19 ENCOUNTER — Encounter: Payer: Self-pay | Admitting: Internal Medicine

## 2013-04-19 VITALS — BP 130/82 | HR 84 | Temp 98.5°F | Wt 246.4 lb

## 2013-04-19 DIAGNOSIS — E119 Type 2 diabetes mellitus without complications: Secondary | ICD-10-CM

## 2013-04-19 DIAGNOSIS — E669 Obesity, unspecified: Secondary | ICD-10-CM

## 2013-04-19 DIAGNOSIS — I10 Essential (primary) hypertension: Secondary | ICD-10-CM

## 2013-04-19 LAB — HEMOGLOBIN A1C: HEMOGLOBIN A1C: 6 % (ref 4.6–6.5)

## 2013-04-19 NOTE — Progress Notes (Signed)
  Subjective:    Patient ID: Alejandro Brown, male    DOB: 1955-12-03, 58 y.o.   MRN: [id number]  HPI  Here for follow up - reviewed chronic medical issues and interval medical events:  DM2 - prev rx'd metformin but noncompliant with same- report adverse side effects on metformin (diarrhea), irregular home monitoring of cbgs (sister in law now helping pt to attend to same since pt's mom passed 07-29-2010)  Chronic edema - L>R, uses compression stockings and request rx for new pain - on daily diuretics but noncompliant with KCl due to size of tabs  OSA - noncompliant with CPAP due to discomfort of machine -   Mild MR, developmental delay - "deceiving" per sister in law - has worked at [employer] for >19 years - lived with mom until her death 2010-07-29 - now alone but sister in law helping pt to attend to medical issues   Past Medical History  Diagnosis Date  . Obesity   . Mental retardation     mild  . GERD (gastroesophageal reflux disease)   . Hypertension   . 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 ALLERGIC RHINITIS   . OBSTRUCTIVE SLEEP APNEA     noncompliant with CPAP  . Type II or unspecified type diabetes mellitus without mention of complication, not stated as uncontrolled     diet controlled    Review of Systems  Constitutional: Negative for fever, activity change, appetite change and unexpected weight change.  Respiratory: Negative for cough and shortness of breath.   Cardiovascular: Negative for chest pain and palpitations.      Objective:   Physical Exam BP 130/82  Pulse 84  Temp(Src) 98.5 F (36.9 C) (Oral)  Wt 246 lb 6.4 oz (111.766 kg)  SpO2 94% Wt Readings from Last 3 Encounters:  04/19/13 246 lb 6.4 oz (111.766 kg)  02/11/13 240 lb (108.863 kg)  01/04/13 245 lb (111.131 kg)   Constitutional:  He is overweight, but appears well-developed and well-nourished. No distress. Sister in Tour manager at side Neck: Thick; Normal range of motion. Neck supple. No JVD present. No  thyromegaly present.  Cardiovascular: Normal rate, regular rhythm and normal heart sounds.  No murmur heard. chronic trace BLE edema, L>R chronic - wearing compression stocking on LLE - Pulmonary/Chest: Effort normal and breath sounds normal. No respiratory distress. no wheezes.  Neurological: he is alert and oriented to person, place, and time. No cranial nerve deficit. Coordination normal.  Skin: Skin is warm and dry.  No erythema or ulceration. hair loss distal legs Psychiatric: he has a normal mood and affect. behavior is normal. Judgment and thought content normal.   Lab Results  Component Value Date   WBC 7.1 09/28/2012   HGB 15.3 09/28/2012   HCT 45.7 09/28/2012   PLT 246.0 09/28/2012   GLUCOSE 109* 09/28/2012   CHOL 160 09/28/2012   TRIG 52.0 09/28/2012   HDL 39.50 09/28/2012   LDLCALC 110* 09/28/2012   ALT 23 09/28/2012   AST 17 09/28/2012   NA 141 09/28/2012   K 5.0 09/28/2012   CL 108 09/28/2012   CREATININE 1.1 09/28/2012   BUN 11 09/28/2012   CO2 29 09/28/2012   TSH 0.90 09/28/2012   PSA 3.77 09/28/2012   HGBA1C 6.2 10/05/2012   MICROALBUR 0.6 10/05/2012      Assessment & Plan:    see problem list. Medications and labs reviewed today.

## 2013-04-19 NOTE — Assessment & Plan Note (Signed)
BP Readings from Last 3 Encounters:  04/19/13 130/82  02/11/13 118/74  01/04/13 135/84   On ramipril The current medical regimen is effective;  continue present plan and medications.

## 2013-04-19 NOTE — Assessment & Plan Note (Signed)
Wt Readings from Last 3 Encounters:  04/19/13 246 lb 6.4 oz (111.766 kg)  02/11/13 240 lb (108.863 kg)  01/04/13 245 lb (111.131 kg)  weight trends reviewed The patient is asked to make an attempt to improve diet and exercise patterns to aid in medical management of this problem.

## 2013-04-19 NOTE — Assessment & Plan Note (Signed)
Diet controlled, weight trends reviewed Hx unable to tolerate metformin due to GI side effects  Managing meds and home cbgs complicated by mild MR  Check a1c now - consider other medication if needed   Lab Results  Component Value Date   HGBA1C 6.2 10/05/2012

## 2013-04-19 NOTE — Patient Instructions (Addendum)
It was good to see you today.  Medications reviewed,  no changes at this time.  Test(s) ordered today. Your results will be released to [place] (or called to you) after review, usually within 72hours after test completion. If any changes need to be made, you will be notified at that same time.  Work on lifestyle changes as discussed (low fat, low carb, increased protein diet; improved exercise efforts; weight loss) to control sugar, blood pressure and cholesterol levels and/or reduce risk of developing other medical problems.  Please schedule followup in 6 months for physical and labs, call sooner if problems.    Diabetes and Standards of Medical Care  Diabetes is complicated. You may find that your diabetes team includes a dietitian, nurse, diabetes educator, eye doctor, and more. To help everyone know what is going on and to help you get the care you deserve, the following schedule of care was developed to help keep you on track. Below are the tests, exams, vaccines, medicines, education, and plans you will need. HbA1c test This test shows how well you have controlled your glucose over the past 2 3 months. It is used to see if your diabetes management plan needs to be adjusted.   It is performed at least 2 times a year if you are meeting treatment goals.  It is performed 4 times a year if therapy has changed or if you are not meeting treatment goals. Blood pressure test  This test is performed at every routine medical visit. The goal is less than 140/90 mmHg for most people, but 130/80 mmHg in some cases. Ask your health care provider about your goal. Dental exam  Follow up with the dentist regularly. Eye exam  If you are diagnosed with type 1 diabetes as a child, get an exam upon reaching the age of 41 years or older and have had diabetes for 3 5 years. Yearly eye exams are recommended after that initial eye exam.  If you are diagnosed with type 1 diabetes as an adult, get an exam  within 5 years of diagnosis and then yearly.  If you are diagnosed with type 2 diabetes, get an exam as soon as possible after the diagnosis and then yearly. Foot care exam  Visual foot exams are performed at every routine medical visit. The exams check for cuts, injuries, or other problems with the feet.  A comprehensive foot exam should be done yearly. This includes visual inspection as well as assessing foot pulses and testing for loss of sensation.  Check your feet nightly for cuts, injuries, or other problems with your feet. Tell your health care provider if anything is not healing. Kidney function test (urine microalbumin)  This test is performed once a year.  Type 1 diabetes: The first test is performed 5 years after diagnosis.  Type 2 diabetes: The first test is performed at the time of diagnosis.  A serum creatinine and estimated glomerular filtration rate (eGFR) test is done once a year to assess the level of chronic kidney disease (CKD), if present. Lipid profile (cholesterol, HDL, LDL, triglycerides)  Performed every 5 years for most people.  The goal for LDL is less than 100 mg/dL. If you are at high risk, the goal is less than 70 mg/dL.  The goal for HDL is 40 mg/dL 50 mg/dL for men and 50 mg/dL 60 mg/dL for women. An HDL cholesterol of 60 mg/dL or higher gives some protection against heart disease.  The goal for triglycerides is  less than 150 mg/dL. Influenza vaccine, pneumococcal vaccine, and hepatitis B vaccine  The influenza vaccine is recommended yearly.  The pneumococcal vaccine is generally given once in a lifetime. However, there are some instances when another vaccination is recommended. Check with your health care provider.  The hepatitis B vaccine is also recommended for adults with diabetes. Diabetes self-management education  Education is recommended at diagnosis and ongoing as needed. Treatment plan  Your treatment plan is reviewed at every medical  visit. Document Released: 01/02/2009 Document Revised: 11/07/2012 Document Reviewed: 08/07/2012 [facility] Patient Information 2014 [place].

## 2013-04-19 NOTE — Progress Notes (Signed)
Pre-visit discussion using our clinic review tool. No additional management support is needed unless otherwise documented below in the visit note.  

## 2013-10-04 ENCOUNTER — Ambulatory Visit (INDEPENDENT_AMBULATORY_CARE_PROVIDER_SITE_OTHER): Payer: Federal, State, Local not specified - PPO | Admitting: Family Medicine

## 2013-10-04 VITALS — BP 138/70 | HR 99 | Temp 98.2°F | Resp 20 | Ht 64.0 in | Wt 239.0 lb

## 2013-10-04 DIAGNOSIS — J029 Acute pharyngitis, unspecified: Secondary | ICD-10-CM

## 2013-10-04 DIAGNOSIS — R059 Cough, unspecified: Secondary | ICD-10-CM

## 2013-10-04 DIAGNOSIS — J069 Acute upper respiratory infection, unspecified: Secondary | ICD-10-CM

## 2013-10-04 DIAGNOSIS — R05 Cough: Secondary | ICD-10-CM

## 2013-10-04 LAB — POCT RAPID STREP A (OFFICE): RAPID STREP A SCREEN: NEGATIVE

## 2013-10-04 MED ORDER — AZITHROMYCIN 250 MG PO TABS: ORAL_TABLET | ORAL | Status: DC

## 2013-10-04 MED ORDER — HYDROCOD POLST-CHLORPHEN POLST 10-8 MG/5ML PO LQCR: 5.0000 mL | Freq: Every evening | ORAL | Status: DC | PRN

## 2013-10-04 NOTE — Patient Instructions (Signed)
Drink at least 8 glasses of water daily while ill For cough- over the counter Delsym during the day   Pharyngitis Pharyngitis is redness, pain, and swelling (inflammation) of your pharynx.  CAUSES  Pharyngitis is usually caused by infection. Most of the time, these infections are from viruses (viral) and are part of a cold. However, sometimes pharyngitis is caused by bacteria (bacterial). Pharyngitis can also be caused by allergies. Viral pharyngitis may be spread from person to person by coughing, sneezing, and personal items or utensils (cups, forks, spoons, toothbrushes). Bacterial pharyngitis may be spread from person to person by more intimate contact, such as kissing.  SIGNS AND SYMPTOMS  Symptoms of pharyngitis include:   Sore throat.   Tiredness (fatigue).   Low-grade fever.   Headache.  Joint pain and muscle aches.  Skin rashes.  Swollen lymph nodes.  Plaque-like film on throat or tonsils (often seen with bacterial pharyngitis). DIAGNOSIS  Your health care provider will ask you questions about your illness and your symptoms. Your medical history, along with a physical exam, is often all that is needed to diagnose pharyngitis. Sometimes, a rapid strep test is done. Other lab tests may also be done, depending on the suspected cause.  TREATMENT  Viral pharyngitis will usually get better in 3-4 days without the use of medicine. Bacterial pharyngitis is treated with medicines that kill germs (antibiotics).  HOME CARE INSTRUCTIONS   Drink enough water and fluids to keep your urine clear or pale yellow.   Only take over-the-counter or prescription medicines as directed by your health care provider:   If you are prescribed antibiotics, make sure you finish them even if you start to feel better.   Do not take aspirin.   Get lots of rest.   Gargle with 8 oz of salt water ( tsp of salt per 1 qt of water) as often as every 1-2 hours to soothe your throat.   Throat  lozenges (if you are not at risk for choking) or sprays may be used to soothe your throat. SEEK MEDICAL CARE IF:   You have large, tender lumps in your neck.  You have a rash.  You cough up green, yellow-brown, or bloody spit. SEEK IMMEDIATE MEDICAL CARE IF:   Your neck becomes stiff.  You drool or are unable to swallow liquids.  You vomit or are unable to keep medicines or liquids down.  You have severe pain that does not go away with the use of recommended medicines.  You have trouble breathing (not caused by a stuffy nose). MAKE SURE YOU:   Understand these instructions.  Will watch your condition.  Will get help right away if you are not doing well or get worse. Document Released: 03/07/2005 Document Revised: 12/26/2012 Document Reviewed: 11/12/2012 [facility] Patient Information 2015 [place], [place]. This information is not intended to replace advice given to you by your health care provider. Make sure you discuss any questions you have with your health care provider.

## 2013-10-04 NOTE — Progress Notes (Signed)
   Subjective:    Patient ID: Alejandro Brown, male    DOB: [birth year], 58 y.o.   MRN: [id number]  HPI Patient presents today with 4 day history of sore throat and nasal congestion. He has been taking sudafed every 4 hours without relief. He sees Dr. Margarita Rana for primary care and has an appointment with her in 13 days.  Patient has 6 pound weight loss since 03/2013. Has been working on diet.  Past Medical History  Diagnosis Date  . Obesity   . Mental retardation     mild  . GERD (gastroesophageal reflux disease)   . Hypertension   . 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 ALLERGIC RHINITIS   . OBSTRUCTIVE SLEEP APNEA     noncompliant with CPAP  . Type II or unspecified type diabetes mellitus without mention of complication, not stated as uncontrolled     diet controlled     Review of Systems No fever or chills, no runny nose, cough productive of sputum ?color, no ear pain, no headache, no chest pain, no SOB, no aches    Objective:   Physical Exam  Vitals reviewed. Constitutional: He is oriented to person, place, and time. He appears well-developed and well-nourished. No distress.  HENT:  Head: Normocephalic and atraumatic.  Right Ear: Tympanic membrane, external ear and ear canal normal.  Left Ear: Tympanic membrane, external ear and ear canal normal.  Nose: Mucosal edema present. Right sinus exhibits no maxillary sinus tenderness and no frontal sinus tenderness. Left sinus exhibits no maxillary sinus tenderness and no frontal sinus tenderness.  Mouth/Throat: Uvula is midline and mucous membranes are normal. Posterior oropharyngeal erythema present. No oropharyngeal exudate or posterior oropharyngeal edema.  Eyes: Conjunctivae are normal.  Neck: Normal range of motion. Neck supple.  Cardiovascular: Normal rate, regular rhythm and normal heart sounds.   Pulmonary/Chest: Effort normal and breath sounds normal.  Musculoskeletal: Normal range of motion.  Lymphadenopathy:    He has  no cervical adenopathy.  Neurological: He is alert and oriented to person, place, and time.  Skin: Skin is warm and dry. He is not diaphoretic.  Psychiatric: He has a normal mood and affect. His behavior is normal. Judgment and thought content normal.   Results for orders placed in visit on 10/04/13  POCT RAPID STREP A (OFFICE)      Result Value Ref Range   Rapid Strep A Screen Negative  Negative       Assessment & Plan:  1. Acute pharyngitis, unspecified pharyngitis type - POCT rapid strep A - Throat culture ([facility])  2. Acute upper respiratory infections of unspecified site - azithromycin (ZITHROMAX Z-PAK) 250 MG tablet; Take 2 tablets today then one a day until finished  Dispense: 6 each; Refill: 0  3. Cough - chlorpheniramine-HYDROcodone (TUSSIONEX PENNKINETIC ER) 10-8 MG/5ML LQCR; Take 5 mLs by mouth at bedtime as needed for cough (cough).  Dispense: 70 mL; Refill: 0  -RTC if no improvement with above treatment  -follow up with Dr. Margarita Rana as scheduled  Elby Beck, FNP-BC  [facility]  10/04/2013 2:53 PM

## 2013-10-06 LAB — CULTURE, GROUP A STREP: Organism ID, Bacteria: NORMAL

## 2013-10-17 ENCOUNTER — Ambulatory Visit (INDEPENDENT_AMBULATORY_CARE_PROVIDER_SITE_OTHER): Payer: Federal, State, Local not specified - PPO | Admitting: Internal Medicine

## 2013-10-17 ENCOUNTER — Other Ambulatory Visit (INDEPENDENT_AMBULATORY_CARE_PROVIDER_SITE_OTHER): Payer: Federal, State, Local not specified - PPO

## 2013-10-17 ENCOUNTER — Encounter: Payer: Self-pay | Admitting: Internal Medicine

## 2013-10-17 VITALS — BP 136/78 | HR 80 | Temp 98.0°F | Resp 16 | Ht 66.0 in | Wt 244.1 lb

## 2013-10-17 DIAGNOSIS — E119 Type 2 diabetes mellitus without complications: Secondary | ICD-10-CM

## 2013-10-17 DIAGNOSIS — I10 Essential (primary) hypertension: Secondary | ICD-10-CM

## 2013-10-17 LAB — LIPID PANEL
CHOL/HDL RATIO: 4
CHOLESTEROL: 152 mg/dL (ref 0–200)
HDL: 40 mg/dL (ref >39.00)
LDL Cholesterol: 94 mg/dL (ref 0–99)
NonHDL: 112
TRIGLYCERIDES: 89 mg/dL (ref 0.0–149.0)
VLDL: 17.8 mg/dL (ref 0.0–40.0)

## 2013-10-17 LAB — BASIC METABOLIC PANEL
BUN: 14 mg/dL (ref 6–23)
CO2: 30 meq/L (ref 19–32)
CREATININE: 1.1 mg/dL (ref 0.4–1.5)
Calcium: 9.3 mg/dL (ref 8.4–10.5)
Chloride: 106 mEq/L (ref 96–112)
GFR: 75.81 mL/min (ref >60.00)
Glucose, Bld: 121 mg/dL — ABNORMAL HIGH (ref 70–99)
Potassium: 4.2 mEq/L (ref 3.5–5.1)
SODIUM: 142 meq/L (ref 135–145)

## 2013-10-17 LAB — HEMOGLOBIN A1C: HEMOGLOBIN A1C: 6.3 % (ref 4.6–6.5)

## 2013-10-17 LAB — MICROALBUMIN / CREATININE URINE RATIO
CREATININE, U: 186.2 mg/dL
MICROALB/CREAT RATIO: 0.2 mg/g (ref 0.0–30.0)
Microalb, Ur: 0.4 mg/dL (ref 0.0–1.9)

## 2013-10-17 NOTE — Assessment & Plan Note (Signed)
Diet controlled at this time weight trends reviewed Hx unable to tolerate metformin due to GI side effects  Managing meds and home cbgs complicated by mild MR  Check a1c now and q81mo- consider other medication if a1c>7   Lab Results  Component Value Date   HGBA1C 6.0 04/19/2013

## 2013-10-17 NOTE — Patient Instructions (Signed)
It was good to see you today.  We have reviewed your prior records including labs and tests today  Test(s) ordered today. Your results will be released to MyChart (or called to you) after review, usually within 72hours after test completion. If any changes need to be made, you will be notified at that same time.  Medications reviewed and updated, no changes recommended at this time.  Please schedule followup in 6 months, call sooner if problems.  

## 2013-10-17 NOTE — Progress Notes (Signed)
Pre visit review using our clinic review tool, if applicable. No additional management support is needed unless otherwise documented below in the visit note. 

## 2013-10-17 NOTE — Progress Notes (Signed)
Subjective:    Patient ID: Alejandro Brown, male    DOB: May 21, 1955, 58 y.o.   MRN: [id number]  HPI  Patient here for follow up Reviewed chronic medical issues and interval medical events  Past Medical History  Diagnosis Date  . Obesity   . Mental retardation     mild  . GERD (gastroesophageal reflux disease)   . Hypertension   . 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 ALLERGIC RHINITIS   . OBSTRUCTIVE SLEEP APNEA     noncompliant with CPAP  . Type II or unspecified type diabetes mellitus without mention of complication, not stated as uncontrolled     diet controlled    Review of Systems  Constitutional: Negative for fever, fatigue and unexpected weight change.  Respiratory: Negative for cough (recent URI reviewed - sx resolved) and shortness of breath.   Cardiovascular: Negative for chest pain and leg swelling (no change in chronic sx).  Neurological: Negative for dizziness and headaches.       Objective:   Physical Exam  BP 136/78  Pulse 80  Temp(Src) 98 F (36.7 C) (Oral)  Resp 16  Ht 5\' 6"  (1.676 m)  Wt 244 lb 1.9 oz (110.732 kg)  BMI 39.42 kg/m2  SpO2 97% Wt Readings from Last 3 Encounters:  10/17/13 244 lb 1.9 oz (110.732 kg)  10/04/13 239 lb (108.41 kg)  04/19/13 246 lb 6.4 oz (111.766 kg)   Constitutional:  He is overweight, but appears well-developed and well-nourished. No distress. Sister in Tour manager at side Neck: Thick; Normal range of motion. Neck supple. No JVD present. No thyromegaly present.  Cardiovascular: Normal rate, regular rhythm and normal heart sounds.  No murmur heard. chronic trace BLE edema, L>R chronic - wearing compression stocking on LLE - Pulmonary/Chest: Effort normal and breath sounds normal. No respiratory distress. no wheezes.  Neurological: he is alert and oriented to person, place, and time. No cranial nerve deficit. Coordination normal.  Skin: Skin is warm and dry.  No erythema or ulceration. hair loss distal  legs Psychiatric: he has a normal mood and affect. behavior is normal. Judgment and thought content normal.   Lab Results  Component Value Date   WBC 7.1 09/28/2012   HGB 15.3 09/28/2012   HCT 45.7 09/28/2012   PLT 246.0 09/28/2012   GLUCOSE 109* 09/28/2012   CHOL 160 09/28/2012   TRIG 52.0 09/28/2012   HDL 39.50 09/28/2012   LDLCALC 110* 09/28/2012   ALT 23 09/28/2012   AST 17 09/28/2012   NA 141 09/28/2012   K 5.0 09/28/2012   CL 108 09/28/2012   CREATININE 1.1 09/28/2012   BUN 11 09/28/2012   CO2 29 09/28/2012   TSH 0.90 09/28/2012   PSA 3.77 09/28/2012   HGBA1C 6.0 04/19/2013   MICROALBUR 0.6 10/05/2012    No results found.     Assessment & Plan:   Problem List Items Addressed This Visit   HTN (hypertension)      BP Readings from Last 3 Encounters:  10/17/13 136/78  10/04/13 138/70  04/19/13 130/82   On ramipril The current medical regimen is effective;  continue present plan and medications.     Relevant Orders      Basic metabolic panel      Lipid panel   Type II or unspecified type diabetes mellitus without mention of complication, not stated as uncontrolled - Primary      Diet controlled at this time weight trends reviewed Hx  unable to tolerate metformin due to GI side effects  Managing meds and home cbgs complicated by mild MR  Check a1c now and q68mo- consider other medication if a1c>7   Lab Results  Component Value Date   HGBA1C 6.0 04/19/2013      Relevant Orders      Hemoglobin A1c      Basic metabolic panel      Lipid panel      Microalbumin / creatinine urine ratio

## 2013-10-17 NOTE — Assessment & Plan Note (Signed)
BP Readings from Last 3 Encounters:  10/17/13 136/78  10/04/13 138/70  04/19/13 130/82   On ramipril The current medical regimen is effective;  continue present plan and medications.

## 2013-10-18 ENCOUNTER — Ambulatory Visit: Payer: Federal, State, Local not specified - PPO | Admitting: Internal Medicine

## 2013-10-20 ENCOUNTER — Other Ambulatory Visit: Payer: Self-pay | Admitting: Internal Medicine

## 2013-11-04 ENCOUNTER — Ambulatory Visit (INDEPENDENT_AMBULATORY_CARE_PROVIDER_SITE_OTHER): Payer: Federal, State, Local not specified - PPO | Admitting: Family Medicine

## 2013-11-04 VITALS — BP 120/78 | HR 95 | Temp 97.5°F | Resp 18 | Ht 64.0 in | Wt 245.0 lb

## 2013-11-04 DIAGNOSIS — R05 Cough: Secondary | ICD-10-CM

## 2013-11-04 DIAGNOSIS — R059 Cough, unspecified: Secondary | ICD-10-CM

## 2013-11-04 DIAGNOSIS — J209 Acute bronchitis, unspecified: Secondary | ICD-10-CM

## 2013-11-04 DIAGNOSIS — J208 Acute bronchitis due to other specified organisms: Secondary | ICD-10-CM

## 2013-11-04 MED ORDER — BENZONATATE 100 MG PO CAPS: 200.0000 mg | ORAL_CAPSULE | Freq: Two times a day (BID) | ORAL | Status: DC | PRN

## 2013-11-04 MED ORDER — AMOXICILLIN 875 MG PO TABS
875.0000 mg | ORAL_TABLET | Freq: Two times a day (BID) | ORAL | Status: DC
Start: 2013-11-04 — End: 2013-11-04

## 2013-11-04 MED ORDER — HYDROCODONE-HOMATROPINE 5-1.5 MG/5ML PO SYRP: 5.0000 mL | ORAL_SOLUTION | Freq: Every evening | ORAL | Status: DC | PRN

## 2013-11-04 MED ORDER — AMOXICILLIN 875 MG PO TABS: 875.0000 mg | ORAL_TABLET | Freq: Two times a day (BID) | ORAL | Status: DC

## 2013-11-04 NOTE — Progress Notes (Signed)
Chief Complaint:  Chief Complaint  Patient presents with  . Cough    x 3days    HPI: Alejandro Brown is a 58 y.o. male who is here for  3 day history of cough ( late Saturday night), he has had a sick coworker, mostly dry cough, no headaches, no facial pain, no ear pain, no rhinorrhea, has had subjective fevers and chills, denies fatigue. He has chest congestion and pleuritic chest pain. He denies SOB or wheezing. He has allergies x 1. He denies asthma   Past Medical History  Diagnosis Date  . Obesity   . Mental retardation     mild  . GERD (gastroesophageal reflux disease)   . Hypertension   . 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 ALLERGIC RHINITIS   . OBSTRUCTIVE SLEEP APNEA     noncompliant with CPAP  . Type II or unspecified type diabetes mellitus without mention of complication, not stated as uncontrolled     diet controlled   Past Surgical History  Procedure Laterality Date  . Nasal septum surgery     History   Social History  . Marital Status: Single    Spouse Name: N/A    Number of Children: N/A  . Years of Education: N/A   Social History Main Topics  . Smoking status: Never Smoker   . Smokeless tobacco: Never Used  . Alcohol Use: No  . Drug Use: No  . Sexual Activity: None   Other Topics Concern  . None   Social History Narrative  . None   Family History  Problem Relation Age of Onset  . Breast cancer Sister   . Breast cancer Other   . Colon cancer Neg Hx    Allergies  Allergen Reactions  . Clarithromycin Nausea Only   Prior to Admission medications   Medication Sig Start Date End Date Taking? Authorizing Provider  furosemide (LASIX) 20 MG tablet take 1 tablet by mouth once daily   Yes Rowe Clack, MD  ramipril (ALTACE) 5 MG capsule take 1 capsule by mouth once daily   Yes Rowe Clack, MD     ROS: The patient denies fevers, chills, night sweats, unintentional weight loss, chest pain, palpitations, wheezing, dyspnea on  exertion, nausea, vomiting, abdominal pain, dysuria, hematuria, melena, numbness, weakness, or tingling.  All other systems have been reviewed and were otherwise negative with the exception of those mentioned in the HPI and as above.    PHYSICAL EXAM: Filed Vitals:   11/04/13 0905  BP: 120/78  Pulse: 95  Temp: 97.5 F (36.4 C)  Resp: 18   Filed Vitals:   11/04/13 0905  Height: 5\' 4"  (1.626 m)  Weight: 245 lb (111.131 kg)   Body mass index is 42.03 kg/(m^2).  General: Alert, no acute distress HEENT:  Normocephalic, atraumatic, oropharynx patent. EOMI, PERRLA. TM normal, nontender sinuses, erythemtous throat Cardiovascular:  Regular rate and rhythm, no rubs murmurs or gallops.  No Carotid bruits, radial pulse intact. No pedal edema.  Respiratory: Clear to auscultation bilaterally.  No wheezes, rales, or rhonchi.  No cyanosis, no use of accessory musculature GI: No organomegaly, abdomen is soft and non-tender, positive bowel sounds.  No masses. Skin: No rashes. Neurologic: Facial musculature symmetric. Psychiatric: Patient is appropriate throughout our interaction. Lymphatic: No cervical lymphadenopathy Musculoskeletal: Gait intact.   LABS: Results for orders placed in visit on 10/17/13  HEMOGLOBIN A1C      Result Value Ref  Range   Hemoglobin A1C 6.3  4.6 - 6.5 %  BASIC METABOLIC PANEL      Result Value Ref Range   Sodium 142  135 - 145 mEq/L   Potassium 4.2  3.5 - 5.1 mEq/L   Chloride 106  96 - 112 mEq/L   CO2 30  19 - 32 mEq/L   Glucose, Bld 121 (*) 70 - 99 mg/dL   BUN 14  6 - 23 mg/dL   Creatinine, Ser 1.1  0.4 - 1.5 mg/dL   Calcium 9.3  8.4 - 10.5 mg/dL   GFR 75.81  >60.00 mL/min  LIPID PANEL      Result Value Ref Range   Cholesterol 152  0 - 200 mg/dL   Triglycerides 89.0  0.0 - 149.0 mg/dL   HDL 40.00  >39.00 mg/dL   VLDL 17.8  0.0 - 40.0 mg/dL   LDL Cholesterol 94  0 - 99 mg/dL   Total CHOL/HDL Ratio 4     NonHDL 112.00    MICROALBUMIN / CREATININE URINE  RATIO      Result Value Ref Range   Microalb, Ur 0.4  0.0 - 1.9 mg/dL   Creatinine,U 186.2     Microalb Creat Ratio 0.2  0.0 - 30.0 mg/g     EKG/XRAY:   Primary read interpreted by Dr. Marin Comment at [facility].   ASSESSMENT/PLAN: Encounter Diagnoses  Name Primary?  . Acute bronchitis due to infection Yes  . Cough    Rx Amoxacillin Rx Tessalon perles and also hycodan, advise to try sxs treatment first before abx F/u as needed  Gross sideeffects, risk and benefits, and alternatives of medications d/w patient. Patient is aware that all medications have potential sideeffects and we are unable to predict every sideeffect or drug-drug interaction that may occur.  LE, [place], DO 11/04/2013 9:50 AM

## 2014-02-10 ENCOUNTER — Ambulatory Visit (INDEPENDENT_AMBULATORY_CARE_PROVIDER_SITE_OTHER): Payer: Federal, State, Local not specified - PPO | Admitting: Emergency Medicine

## 2014-02-10 ENCOUNTER — Ambulatory Visit (INDEPENDENT_AMBULATORY_CARE_PROVIDER_SITE_OTHER): Payer: Federal, State, Local not specified - PPO

## 2014-02-10 VITALS — BP 126/80 | HR 90 | Temp 98.2°F | Resp 17 | Ht 64.5 in | Wt 247.0 lb

## 2014-02-10 DIAGNOSIS — R05 Cough: Secondary | ICD-10-CM

## 2014-02-10 DIAGNOSIS — J309 Allergic rhinitis, unspecified: Secondary | ICD-10-CM

## 2014-02-10 DIAGNOSIS — R059 Cough, unspecified: Secondary | ICD-10-CM

## 2014-02-10 MED ORDER — MONTELUKAST SODIUM 10 MG PO TABS: 10.0000 mg | ORAL_TABLET | Freq: Every day | ORAL | Status: DC
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
## 2014-04-21 ENCOUNTER — Ambulatory Visit (INDEPENDENT_AMBULATORY_CARE_PROVIDER_SITE_OTHER): Payer: Federal, State, Local not specified - PPO | Admitting: Internal Medicine

## 2014-04-21 ENCOUNTER — Other Ambulatory Visit (INDEPENDENT_AMBULATORY_CARE_PROVIDER_SITE_OTHER): Payer: Federal, State, Local not specified - PPO

## 2014-04-21 ENCOUNTER — Encounter: Payer: Self-pay | Admitting: Internal Medicine

## 2014-04-21 VITALS — BP 130/82 | HR 73 | Temp 98.3°F | Ht 64.5 in | Wt 251.0 lb

## 2014-04-21 DIAGNOSIS — R609 Edema, unspecified: Secondary | ICD-10-CM

## 2014-04-21 DIAGNOSIS — I10 Essential (primary) hypertension: Secondary | ICD-10-CM

## 2014-04-21 DIAGNOSIS — E669 Obesity, unspecified: Secondary | ICD-10-CM

## 2014-04-21 DIAGNOSIS — Z23 Encounter for immunization: Secondary | ICD-10-CM

## 2014-04-21 DIAGNOSIS — E119 Type 2 diabetes mellitus without complications: Secondary | ICD-10-CM

## 2014-04-21 LAB — LIPID PANEL
Cholesterol: 139 mg/dL (ref 0–200)
HDL: 48.4 mg/dL (ref >39.00)
LDL CALC: 66 mg/dL (ref 0–99)
NONHDL: 90.6
TRIGLYCERIDES: 125 mg/dL (ref 0.0–149.0)
Total CHOL/HDL Ratio: 3
VLDL: 25 mg/dL (ref 0.0–40.0)

## 2014-04-21 LAB — HEMOGLOBIN A1C: HEMOGLOBIN A1C: 6.6 % — AB (ref 4.6–6.5)

## 2014-04-21 LAB — MICROALBUMIN / CREATININE URINE RATIO
CREATININE, U: 153.6 mg/dL
MICROALB/CREAT RATIO: 0.5 mg/g (ref 0.0–30.0)
Microalb, Ur: <0.7 mg/dL (ref 0.0–1.9)

## 2014-04-21 LAB — BASIC METABOLIC PANEL
BUN: 12 mg/dL (ref 6–23)
CO2: 28 mEq/L (ref 19–32)
CREATININE: 0.94 mg/dL (ref 0.40–1.50)
Calcium: 8.7 mg/dL (ref 8.4–10.5)
Chloride: 106 mEq/L (ref 96–112)
GFR: 86.94 mL/min (ref >60.00)
Glucose, Bld: 112 mg/dL — ABNORMAL HIGH (ref 70–99)
Potassium: 4.5 mEq/L (ref 3.5–5.1)
Sodium: 139 mEq/L (ref 135–145)

## 2014-04-21 MED ORDER — FUROSEMIDE 20 MG PO TABS: 20.0000 mg | ORAL_TABLET | Freq: Every day | ORAL | Status: DC

## 2014-04-21 MED ORDER — ONE-A-DAY MENS PO TABS: 1.0000 | ORAL_TABLET | Freq: Every day | ORAL | Status: DC

## 2014-04-21 MED ORDER — ASPIRIN EC 81 MG PO TBEC: 81.0000 mg | DELAYED_RELEASE_TABLET | Freq: Every day | ORAL | Status: DC

## 2014-04-21 NOTE — Assessment & Plan Note (Signed)
Wt Readings from Last 3 Encounters:  04/21/14 251 lb (113.853 kg)  02/10/14 247 lb (112.038 kg)  11/04/13 245 lb (111.131 kg)  weight trends reviewed The patient is asked to make an attempt to improve diet and exercise patterns to aid in medical management of this problem.

## 2014-04-21 NOTE — Assessment & Plan Note (Signed)
Diet controlled at this time weight trends reviewed Hx unable to tolerate metformin due to GI side effects  Managing meds and home cbgs complicated by mild MR  Check a1c now and q17mo- consider other medication if a1c>7   Lab Results  Component Value Date   HGBA1C 6.3 10/17/2013

## 2014-04-21 NOTE — Assessment & Plan Note (Signed)
BP Readings from Last 3 Encounters:  04/21/14 130/82  02/10/14 126/80  11/04/13 120/78   On ramipril The current medical regimen is effective;  continue present plan and medications.

## 2014-04-21 NOTE — Progress Notes (Signed)
Subjective:    Patient ID: Alejandro Brown, male    DOB: 11-Sep-1955, 59 y.o.   MRN: [id number]  HPI  Patient here for follow up  Past Medical History  Diagnosis Date  . Obesity   . Mental retardation     mild  . GERD (gastroesophageal reflux disease)   . Hypertension   . 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 ALLERGIC RHINITIS   . OBSTRUCTIVE SLEEP APNEA     noncompliant with CPAP  . Type II or unspecified type diabetes mellitus without mention of complication, not stated as uncontrolled     diet controlled    Review of Systems  Constitutional: Negative for fever, fatigue and unexpected weight change.  Respiratory: Negative for cough and shortness of breath.   Cardiovascular: Positive for leg swelling (chronic w/o change). Negative for chest pain.       Objective:    Physical Exam  Constitutional: He appears well-developed and well-nourished. No distress.  Obese; sister at side  Cardiovascular: Normal rate, regular rhythm and normal heart sounds.   No murmur heard. Pulmonary/Chest: Effort normal and breath sounds normal. No respiratory distress.  Musculoskeletal: He exhibits edema (chronic - wearing compression hose on LLE; 1+ BLE).    BP 130/82 mmHg  Pulse 73  Temp(Src) 98.3 F (36.8 C) (Oral)  Ht 5' 4.5" (1.638 m)  Wt 251 lb (113.853 kg)  BMI 42.43 kg/m2  SpO2 94% Wt Readings from Last 3 Encounters:  04/21/14 251 lb (113.853 kg)  02/10/14 247 lb (112.038 kg)  11/04/13 245 lb (111.131 kg)    Lab Results  Component Value Date   WBC 7.1 09/28/2012   HGB 15.3 09/28/2012   HCT 45.7 09/28/2012   PLT 246.0 09/28/2012   GLUCOSE 121* 10/17/2013   CHOL 152 10/17/2013   TRIG 89.0 10/17/2013   HDL 40.00 10/17/2013   LDLCALC 94 10/17/2013   ALT 23 09/28/2012   AST 17 09/28/2012   NA 142 10/17/2013   K 4.2 10/17/2013   CL 106 10/17/2013   CREATININE 1.1 10/17/2013   BUN 14 10/17/2013   CO2 30 10/17/2013   TSH 0.90 09/28/2012   PSA 3.77 09/28/2012   HGBA1C 6.3 10/17/2013   MICROALBUR 0.4 10/17/2013    No results found.     Assessment & Plan:   Problem List Items Addressed This Visit    Dependent edema    chronic LLE>RLE - uses compression hose on LLE for same Will write rx for BLE stockings Lasix daily prn       Relevant Orders   DME Other see comment   Diabetes type 2, controlled - Primary    Diet controlled at this time weight trends reviewed Hx unable to tolerate metformin due to GI side effects  Managing meds and home cbgs complicated by mild MR  Check a1c now and q17mo- consider other medication if a1c>7   Lab Results  Component Value Date   HGBA1C 6.3 10/17/2013        Relevant Medications   ramipril (ALTACE) 5 MG capsule   aspirin EC tablet   Other Relevant Orders   Hemoglobin K3T   Basic metabolic panel   Lipid panel   Microalbumin / creatinine urine ratio   HTN (hypertension)    BP Readings from Last 3 Encounters:  04/21/14 130/82  02/10/14 126/80  11/04/13 120/78   On ramipril The current medical regimen is effective;  continue present plan and medications.  Relevant Medications   ramipril (ALTACE) 5 MG capsule   aspirin EC tablet   furosemide (LASIX) tablet   Other Relevant Orders   DME Other see comment   Obesity    Wt Readings from Last 3 Encounters:  04/21/14 251 lb (113.853 kg)  02/10/14 247 lb (112.038 kg)  11/04/13 245 lb (111.131 kg)  weight trends reviewed The patient is asked to make an attempt to improve diet and exercise patterns to aid in medical management of this problem.        Relevant Orders   DME Other see comment    Other Visit Diagnoses    Need for prophylactic vaccination against Streptococcus pneumoniae (pneumococcus)        Relevant Orders    Pneumococcal conjugate vaccine 13-valent (Completed)        Gwendolyn Grant, MD

## 2014-04-21 NOTE — Patient Instructions (Signed)
It was good to see you today.  We have reviewed your prior records including labs and tests today  Test(s) ordered today. Your results will be released to [place] (or called to you) after review, usually within 72hours after test completion. If any changes need to be made, you will be notified at that same time.  Written prescription for compression hose on both legs provided for you today to take to [facility] supply  Second pneumonia immunization updated today  Medications reviewed and updated, no changes recommended at this time. Refill on medication(s) as discussed today.  Please schedule followup in 6 months, call sooner if problems.

## 2014-04-21 NOTE — Assessment & Plan Note (Signed)
chronic LLE>RLE - uses compression hose on LLE for same Will write rx for BLE stockings Lasix daily prn

## 2014-04-21 NOTE — Progress Notes (Signed)
Pre visit review using our clinic review tool, if applicable. No additional management support is needed unless otherwise documented below in the visit note. 

## 2014-05-06 ENCOUNTER — Ambulatory Visit (INDEPENDENT_AMBULATORY_CARE_PROVIDER_SITE_OTHER): Payer: Federal, State, Local not specified - PPO

## 2014-05-06 ENCOUNTER — Ambulatory Visit (INDEPENDENT_AMBULATORY_CARE_PROVIDER_SITE_OTHER): Payer: Federal, State, Local not specified - PPO | Admitting: Physician Assistant

## 2014-05-06 VITALS — BP 142/84 | HR 81 | Temp 97.9°F | Resp 16 | Ht 64.5 in | Wt 245.4 lb

## 2014-05-06 DIAGNOSIS — R030 Elevated blood-pressure reading, without diagnosis of hypertension: Secondary | ICD-10-CM

## 2014-05-06 DIAGNOSIS — M25512 Pain in left shoulder: Secondary | ICD-10-CM

## 2014-05-06 DIAGNOSIS — IMO0001 Reserved for inherently not codable concepts without codable children: Secondary | ICD-10-CM

## 2014-05-06 DIAGNOSIS — M7582 Other shoulder lesions, left shoulder: Secondary | ICD-10-CM

## 2014-05-06 MED ORDER — MELOXICAM 15 MG PO TABS: 15.0000 mg | ORAL_TABLET | Freq: Every day | ORAL | Status: DC

## 2014-05-06 NOTE — Patient Instructions (Signed)
Please do these exercises 2-3 times per day. Please take the mobic once daily for the next 4 weeks. Please apply ice to the area after your exercises.  If you're not feeling better in 4 weeks, please return to clinic. If Dr Carlota Raspberry is here (please call ahead) we could do an injection in the shoulder. We may refer you to physical therapy if the pain is still present after doing these exercises and taking the mobic.    Impingement Syndrome, Rotator Cuff, Bursitis with Rehab Impingement syndrome is a condition that involves inflammation of the tendons of the rotator cuff and the subacromial bursa, that causes pain in the shoulder. The rotator cuff consists of four tendons and muscles that control much of the shoulder and upper arm function. The subacromial bursa is a fluid filled sac that helps reduce friction between the rotator cuff and one of the bones of the shoulder (acromion). Impingement syndrome is usually an overuse injury that causes swelling of the bursa (bursitis), swelling of the tendon (tendonitis), and/or a tear of the tendon (strain). Strains are classified into three categories. Grade 1 strains cause pain, but the tendon is not lengthened. Grade 2 strains include a lengthened ligament, due to the ligament being stretched or partially ruptured. With grade 2 strains there is still function, although the function may be decreased. Grade 3 strains include a complete tear of the tendon or muscle, and function is usually impaired. SYMPTOMS   Pain around the shoulder, often at the outer portion of the upper arm.  Pain that gets worse with shoulder function, especially when reaching overhead or lifting.  Sometimes, aching when not using the arm.  Pain that wakes you up at night.  Sometimes, tenderness, swelling, warmth, or redness over the affected area.  Loss of strength.  Limited motion of the shoulder, especially reaching behind the back (to the back pocket or to unhook bra) or across  your body.  Crackling sound (crepitation) when moving the arm.  Biceps tendon pain and inflammation (in the front of the shoulder). Worse when bending the elbow or lifting. CAUSES  Impingement syndrome is often an overuse injury, in which chronic (repetitive) motions cause the tendons or bursa to become inflamed. A strain occurs when a force is paced on the tendon or muscle that is greater than it can withstand. Common mechanisms of injury include: Stress from sudden increase in duration, frequency, or intensity of training.  Direct hit (trauma) to the shoulder.  Aging, erosion of the tendon with normal use.  Bony bump on shoulder (acromial spur). RISK INCREASES WITH:  Contact sports (football, wrestling, boxing).  Throwing sports (baseball, tennis, volleyball).  Weightlifting and bodybuilding.  Heavy labor.  Previous injury to the rotator cuff, including impingement.  Poor shoulder strength and flexibility.  Failure to warm up properly before activity.  Inadequate protective equipment.  Old age.  Bony bump on shoulder (acromial spur). PREVENTION   Warm up and stretch properly before activity.  Allow for adequate recovery between workouts.  Maintain physical fitness:  Strength, flexibility, and endurance.  Cardiovascular fitness.  Learn and use proper exercise technique. PROGNOSIS  If treated properly, impingement syndrome usually goes away within 6 weeks. Sometimes surgery is required.  RELATED COMPLICATIONS   Longer healing time if not properly treated, or if not given enough time to heal.  Recurring symptoms, that result in a chronic condition.  Shoulder stiffness, frozen shoulder, or loss of motion.  Rotator cuff tendon tear.  Recurring symptoms, especially if  activity is resumed too soon, with overuse, with a direct blow, or when using poor technique. TREATMENT  Treatment first involves the use of ice and medicine, to reduce pain and inflammation.  The use of strengthening and stretching exercises may help reduce pain with activity. These exercises may be performed at home or with a therapist. If non-surgical treatment is unsuccessful after more than 6 months, surgery may be advised. After surgery and rehabilitation, activity is usually possible in 3 months.  MEDICATION  If pain medicine is needed, nonsteroidal anti-inflammatory medicines (aspirin and ibuprofen), or other minor pain relievers (acetaminophen), are often advised.  Do not take pain medicine for 7 days before surgery.  Prescription pain relievers may be given, if your caregiver thinks they are needed. Use only as directed and only as much as you need.  Corticosteroid injections may be given by your caregiver. These injections should be reserved for the most serious cases, because they may only be given a certain number of times. HEAT AND COLD  Cold treatment (icing) should be applied for 10 to 15 minutes every 2 to 3 hours for inflammation and pain, and immediately after activity that aggravates your symptoms. Use ice packs or an ice massage.  Heat treatment may be used before performing stretching and strengthening activities prescribed by your caregiver, physical therapist, or athletic trainer. Use a heat pack or a warm water soak. SEEK MEDICAL CARE IF:   Symptoms get worse or do not improve in 4 to 6 weeks, despite treatment.  New, unexplained symptoms develop. (Drugs used in treatment may produce side effects.) EXERCISES  RANGE OF MOTION (ROM) AND STRETCHING EXERCISES - Impingement Syndrome (Rotator Cuff  Tendinitis, Bursitis) These exercises may help you when beginning to rehabilitate your injury. Your symptoms may go away with or without further involvement from your physician, physical therapist or athletic trainer. While completing these exercises, remember:   Restoring tissue flexibility helps normal motion to return to the joints. This allows healthier, less  painful movement and activity.  An effective stretch should be held for at least 30 seconds.  A stretch should never be painful. You should only feel a gentle lengthening or release in the stretched tissue. STRETCH - Flexion, Standing  Stand with good posture. With an underhand grip on your right / left hand, and an overhand grip on the opposite hand, grasp a broomstick or cane so that your hands are a little more than shoulder width apart.  Keeping your right / left elbow straight and shoulder muscles relaxed, push the stick with your opposite hand, to raise your right / left arm in front of your body and then overhead. Raise your arm until you feel a stretch in your right / left shoulder, but before you have increased shoulder pain.  Try to avoid shrugging your right / left shoulder as your arm rises, by keeping your shoulder blade tucked down and toward your mid-back spine. Hold for __________ seconds.  Slowly return to the starting position. Repeat __________ times. Complete this exercise __________ times per day. STRETCH - Abduction, Supine  Lie on your back. With an underhand grip on your right / left hand and an overhand grip on the opposite hand, grasp a broomstick or cane so that your hands are a little more than shoulder width apart.  Keeping your right / left elbow straight and your shoulder muscles relaxed, push the stick with your opposite hand, to raise your right / left arm out to the side of  your body and then overhead. Raise your arm until you feel a stretch in your right / left shoulder, but before you have increased shoulder pain.  Try to avoid shrugging your right / left shoulder as your arm rises, by keeping your shoulder blade tucked down and toward your mid-back spine. Hold for __________ seconds.  Slowly return to the starting position. Repeat __________ times. Complete this exercise __________ times per day. ROM - Flexion, Active-Assisted  Lie on your back. You may  bend your knees for comfort.  Grasp a broomstick or cane so your hands are about shoulder width apart. Your right / left hand should grip the end of the stick, so that your hand is positioned "thumbs-up," as if you were about to shake hands.  Using your healthy arm to lead, raise your right / left arm overhead, until you feel a gentle stretch in your shoulder. Hold for __________ seconds.  Use the stick to assist in returning your right / left arm to its starting position. Repeat __________ times. Complete this exercise __________ times per day.  ROM - Internal Rotation, Supine   Lie on your back on a firm surface. Place your right / left elbow about 60 degrees away from your side. Elevate your elbow with a folded towel, so that the elbow and shoulder are the same height.  Using a broomstick or cane and your strong arm, pull your right / left hand toward your body until you feel a gentle stretch, but no increase in your shoulder pain. Keep your shoulder and elbow in place throughout the exercise.  Hold for __________ seconds. Slowly return to the starting position. Repeat __________ times. Complete this exercise __________ times per day. STRETCH - Internal Rotation  Place your right / left hand behind your back, palm up.  Throw a towel or belt over your opposite shoulder. Grasp the towel with your right / left hand.  While keeping an upright posture, gently pull up on the towel, until you feel a stretch in the front of your right / left shoulder.  Avoid shrugging your right / left shoulder as your arm rises, by keeping your shoulder blade tucked down and toward your mid-back spine.  Hold for __________ seconds. Release the stretch, by lowering your healthy hand. Repeat __________ times. Complete this exercise __________ times per day. ROM - Internal Rotation   Using an underhand grip, grasp a stick behind your back with both hands.  While standing upright with good posture, slide the  stick up your back until you feel a mild stretch in the front of your shoulder.  Hold for __________ seconds. Slowly return to your starting position. Repeat __________ times. Complete this exercise __________ times per day.  STRETCH - Posterior Shoulder Capsule   Stand or sit with good posture. Grasp your right / left elbow and draw it across your chest, keeping it at the same height as your shoulder.  Pull your elbow, so your upper arm comes in closer to your chest. Pull until you feel a gentle stretch in the back of your shoulder.  Hold for __________ seconds. Repeat __________ times. Complete this exercise __________ times per day. STRENGTHENING EXERCISES - Impingement Syndrome (Rotator Cuff Tendinitis, Bursitis) These exercises may help you when beginning to rehabilitate your injury. They may resolve your symptoms with or without further involvement from your physician, physical therapist or athletic trainer. While completing these exercises, remember:  Muscles can gain both the endurance and the strength needed for  everyday activities through controlled exercises.  Complete these exercises as instructed by your physician, physical therapist or athletic trainer. Increase the resistance and repetitions only as guided.  You may experience muscle soreness or fatigue, but the pain or discomfort you are trying to eliminate should never worsen during these exercises. If this pain does get worse, stop and make sure you are following the directions exactly. If the pain is still present after adjustments, discontinue the exercise until you can discuss the trouble with your clinician.  During your recovery, avoid activity or exercises which involve actions that place your injured hand or elbow above your head or behind your back or head. These positions stress the tissues which you are trying to heal. STRENGTH - Scapular Depression and Adduction   With good posture, sit on a firm chair. Support  your arms in front of you, with pillows, arm rests, or on a table top. Have your elbows in line with the sides of your body.  Gently draw your shoulder blades down and toward your mid-back spine. Gradually increase the tension, without tensing the muscles along the top of your shoulders and the back of your neck.  Hold for __________ seconds. Slowly release the tension and relax your muscles completely before starting the next repetition.  After you have practiced this exercise, remove the arm support and complete the exercise in standing as well as sitting position. Repeat __________ times. Complete this exercise __________ times per day.  STRENGTH - Shoulder Abductors, Isometric  With good posture, stand or sit about 4-6 inches from a wall, with your right / left side facing the wall.  Bend your right / left elbow. Gently press your right / left elbow into the wall. Increase the pressure gradually, until you are pressing as hard as you can, without shrugging your shoulder or increasing any shoulder discomfort.  Hold for __________ seconds.  Release the tension slowly. Relax your shoulder muscles completely before you begin the next repetition. Repeat __________ times. Complete this exercise __________ times per day.  STRENGTH - External Rotators, Isometric  Keep your right / left elbow at your side and bend it 90 degrees.  Step into a door frame so that the outside of your right / left wrist can press against the door frame without your upper arm leaving your side.  Gently press your right / left wrist into the door frame, as if you were trying to swing the back of your hand away from your stomach. Gradually increase the tension, until you are pressing as hard as you can, without shrugging your shoulder or increasing any shoulder discomfort.  Hold for __________ seconds.  Release the tension slowly. Relax your shoulder muscles completely before you begin the next repetition. Repeat  __________ times. Complete this exercise __________ times per day.  STRENGTH - Supraspinatus   Stand or sit with good posture. Grasp a __________ weight, or an exercise band or tubing, so that your hand is "thumbs-up," like you are shaking hands.  Slowly lift your right / left arm in a "V" away from your thigh, diagonally into the space between your side and straight ahead. Lift your hand to shoulder height or as far as you can, without increasing any shoulder pain. At first, many people do not lift their hands above shoulder height.  Avoid shrugging your right / left shoulder as your arm rises, by keeping your shoulder blade tucked down and toward your mid-back spine.  Hold for __________ seconds. Control the  descent of your hand, as you slowly return to your starting position. Repeat __________ times. Complete this exercise __________ times per day.  STRENGTH - External Rotators  Secure a rubber exercise band or tubing to a fixed object (table, pole) so that it is at the same height as your right / left elbow when you are standing or sitting on a firm surface.  Stand or sit so that the secured exercise band is at your uninjured side.  Bend your right / left elbow 90 degrees. Place a folded towel or small pillow under your right / left arm, so that your elbow is a few inches away from your side.  Keeping the tension on the exercise band, pull it away from your body, as if pivoting on your elbow. Be sure to keep your body steady, so that the movement is coming only from your rotating shoulder.  Hold for __________ seconds. Release the tension in a controlled manner, as you return to the starting position. Repeat __________ times. Complete this exercise __________ times per day.  STRENGTH - Internal Rotators   Secure a rubber exercise band or tubing to a fixed object (table, pole) so that it is at the same height as your right / left elbow when you are standing or sitting on a firm  surface.  Stand or sit so that the secured exercise band is at your right / left side.  Bend your elbow 90 degrees. Place a folded towel or small pillow under your right / left arm so that your elbow is a few inches away from your side.  Keeping the tension on the exercise band, pull it across your body, toward your stomach. Be sure to keep your body steady, so that the movement is coming only from your rotating shoulder.  Hold for __________ seconds. Release the tension in a controlled manner, as you return to the starting position. Repeat __________ times. Complete this exercise __________ times per day.  STRENGTH - Scapular Protractors, Standing   Stand arms length away from a wall. Place your hands on the wall, keeping your elbows straight.  Begin by dropping your shoulder blades down and toward your mid-back spine.  To strengthen your protractors, keep your shoulder blades down, but slide them forward on your rib cage. It will feel as if you are lifting the back of your rib cage away from the wall. This is a subtle motion and can be challenging to complete. Ask your caregiver for further instruction, if you are not sure you are doing the exercise correctly.  Hold for __________ seconds. Slowly return to the starting position, resting the muscles completely before starting the next repetition. Repeat __________ times. Complete this exercise __________ times per day. STRENGTH - Scapular Protractors, Supine  Lie on your back on a firm surface. Extend your right / left arm straight into the air while holding a __________ weight in your hand.  Keeping your head and back in place, lift your shoulder off the floor.  Hold for __________ seconds. Slowly return to the starting position, and allow your muscles to relax completely before starting the next repetition. Repeat __________ times. Complete this exercise __________ times per day. STRENGTH - Scapular Protractors, Quadruped  Get onto  your hands and knees, with your shoulders directly over your hands (or as close as you can be, comfortably).  Keeping your elbows locked, lift the back of your rib cage up into your shoulder blades, so your mid-back rounds out. Keep your  neck muscles relaxed.  Hold this position for __________ seconds. Slowly return to the starting position and allow your muscles to relax completely before starting the next repetition. Repeat __________ times. Complete this exercise __________ times per day.  STRENGTH - Scapular Retractors  Secure a rubber exercise band or tubing to a fixed object (table, pole), so that it is at the height of your shoulders when you are either standing, or sitting on a firm armless chair.  With a palm down grip, grasp an end of the band in each hand. Straighten your elbows and lift your hands straight in front of you, at shoulder height. Step back, away from the secured end of the band, until it becomes tense.  Squeezing your shoulder blades together, draw your elbows back toward your sides, as you bend them. Keep your upper arms lifted away from your body throughout the exercise.  Hold for __________ seconds. Slowly ease the tension on the band, as you reverse the directions and return to the starting position. Repeat __________ times. Complete this exercise __________ times per day. STRENGTH - Shoulder Extensors   Secure a rubber exercise band or tubing to a fixed object (table, pole) so that it is at the height of your shoulders when you are either standing, or sitting on a firm armless chair.  With a thumbs-up grip, grasp an end of the band in each hand. Straighten your elbows and lift your hands straight in front of you, at shoulder height. Step back, away from the secured end of the band, until it becomes tense.  Squeezing your shoulder blades together, pull your hands down to the sides of your thighs. Do not allow your hands to go behind you.  Hold for __________  seconds. Slowly ease the tension on the band, as you reverse the directions and return to the starting position. Repeat __________ times. Complete this exercise __________ times per day.  STRENGTH - Scapular Retractors and External Rotators   Secure a rubber exercise band or tubing to a fixed object (table, pole) so that it is at the height as your shoulders, when you are either standing, or sitting on a firm armless chair.  With a palm down grip, grasp an end of the band in each hand. Bend your elbows 90 degrees and lift your elbows to shoulder height, at your sides. Step back, away from the secured end of the band, until it becomes tense.  Squeezing your shoulder blades together, rotate your shoulders so that your upper arms and elbows remain stationary, but your fists travel upward to head height.  Hold for __________ seconds. Slowly ease the tension on the band, as you reverse the directions and return to the starting position. Repeat __________ times. Complete this exercise __________ times per day.  STRENGTH - Scapular Retractors and External Rotators, Rowing   Secure a rubber exercise band or tubing to a fixed object (table, pole) so that it is at the height of your shoulders, when you are either standing, or sitting on a firm armless chair.  With a palm down grip, grasp an end of the band in each hand. Straighten your elbows and lift your hands straight in front of you, at shoulder height. Step back, away from the secured end of the band, until it becomes tense.  Step 1: Squeeze your shoulder blades together. Bending your elbows, draw your hands to your chest, as if you are rowing a boat. At the end of this motion, your hands and elbow  should be at shoulder height and your elbows should be out to your sides.  Step 2: Rotate your shoulders, to raise your hands above your head. Your forearms should be vertical and your upper arms should be horizontal.  Hold for __________ seconds. Slowly  ease the tension on the band, as you reverse the directions and return to the starting position. Repeat __________ times. Complete this exercise __________ times per day.  STRENGTH - Scapular Depressors  Find a sturdy chair without wheels, such as a dining room chair.  Keeping your feet on the floor, and your hands on the chair arms, lift your bottom up from the seat, and lock your elbows.  Keeping your elbows straight, allow gravity to pull your body weight down. Your shoulders will rise toward your ears.  Raise your body against gravity by drawing your shoulder blades down your back, shortening the distance between your shoulders and ears. Although your feet should always maintain contact with the floor, your feet should progressively support less body weight, as you get stronger.  Hold for __________ seconds. In a controlled and slow manner, lower your body weight to begin the next repetition. Repeat __________ times. Complete this exercise __________ times per day.  Document Released: 03/07/2005 Document Revised: 05/30/2011 Document Reviewed: 06/19/2008 [facility] Patient Information 2015 [place], [place]. This information is not intended to replace advice given to you by your health care provider. Make sure you discuss any questions you have with your health care provider.

## 2014-05-06 NOTE — Progress Notes (Signed)
Subjective:    Patient ID: Alejandro Brown, male    DOB: Aug 25, 1955, 59 y.o.   MRN: [id number]  Chief Complaint  Patient presents with  . Shoulder Pain    Left-No Injury that patient is aware of-   Patient Active Problem List   Diagnosis Date Noted  . HTN (hypertension) 08/31/2010  . Diabetes type 2, controlled 08/31/2010  . Obesity 08/31/2010  . Mental retardation 08/31/2010  . Dependent edema 08/31/2010  . OBSTRUCTIVE SLEEP APNEA 05/05/2007  . ALLERGIC RHINITIS 05/04/2007   Prior to Admission medications   Medication Sig Start Date End Date Taking? Authorizing Provider  aspirin EC 81 MG tablet Take 1 tablet (81 mg total) by mouth daily. 04/21/14  Yes Rowe Clack, MD  furosemide (LASIX) 20 MG tablet Take 1 tablet (20 mg total) by mouth daily. 04/21/14  Yes Rowe Clack, MD  multivitamin (ONE-A-DAY MEN'S) TABS tablet Take 1 tablet by mouth daily. 04/21/14  Yes Rowe Clack, MD  ramipril (ALTACE) 5 MG capsule Take 5 mg by mouth daily.   Yes Historical Provider, MD   Medications, allergies, past medical history, surgical history, family history, social history and problem list reviewed and updated.  HPI  3 yom with pmh htn, diet controlled dm2, osa presents with left shoulder pain.  Sx started approx one month ago. Pt cannot recall any trauma or injury.  He feels like the pain begins on top of the shoulder and at times can radiate down the front of the arm almost to the elbow. Pain is intermittent, rated as moderate when it comes on. Often resolves on its own. Certain movements (overhead, reaching in front) aggravate the pain. Denies any weakness with left elbow, left hand.   BP mildly elevated today 142/84. BP has been wnl at majority of prior visits.   Review of Systems No cp, sob, fever, chills, abd pain, n/v, diarrhea.     Objective:   Physical Exam  Constitutional: He is oriented to person, place, and time. He appears well-developed and well-nourished.   Non-toxic appearance. He does not have a sickly appearance. He does not appear ill. No distress.  BP 142/84 mmHg  Pulse 81  Temp(Src) 97.9 F (36.6 C) (Oral)  Resp 16  Ht 5' 4.5" (1.638 m)  Wt 245 lb 6 oz (111.301 kg)  BMI 41.48 kg/m2  SpO2 97%   Cardiovascular: Normal rate, regular rhythm and normal heart sounds.  Exam reveals no gallop.   No murmur heard. Musculoskeletal:       Left shoulder: He exhibits decreased range of motion, tenderness and swelling. He exhibits no bony tenderness, no effusion and no spasm.       Cervical back: Normal. He exhibits normal range of motion, no tenderness and no bony tenderness.  Decreased rom left shoulder with abduction, overhead motion. Positive pain with resisted abduction. Positive neers test. Neg crossover test. Neg liftoff test. No ttp over shoulder. Mild swelling anterior shoulder. Normal strength, sensation left elbow, forearm, hand. 2+ radial pulse. Normal cap refill.   Neurological: He is alert and oriented to person, place, and time.  Psychiatric: He has a normal mood and affect. His speech is normal.   [facility] reading (PRIMARY) by Dr. Carlota Raspberry. Findings: Degenerative changes at [facility] joint. Otherwise normal.      Assessment & Plan:   60 yom with pmh htn, diet controlled dm2, osa presents with left shoulder pain.  Left shoulder pain - Plan: DG Shoulder Left --xray normal, mild  ac joint changes --most likely rotator cuff tendinopathy, impingement syndrome with positive testing, pain with overhead movements, relatively normal xray --assume intrinsic shoulder cause as only painful with certain arm movements --exercises given, reviewed with pt --mobic --ice after exercises --rtc one month if pain not improved, pt instructed to call to ensure Dr Carlota Raspberry here if he would like injxn, otherwise poss PT/ortho referral  Elevated BP --slightly elevated in clinic today, previous visits have been wnl --continue to monitor  Julieta Gutting,  PA-C Physician Assistant-Certified [facility]  05/06/2014 6:00 PM

## 2014-06-20 HISTORY — PX: ROTATOR CUFF REPAIR: SHX139

## 2014-06-24 ENCOUNTER — Other Ambulatory Visit: Payer: Self-pay | Admitting: Orthopedic Surgery

## 2014-06-24 DIAGNOSIS — M25512 Pain in left shoulder: Secondary | ICD-10-CM

## 2014-06-25 ENCOUNTER — Ambulatory Visit
Admission: RE | Admit: 2014-06-25 | Discharge: 2014-06-25 | Disposition: A | Discharge status: Home / Self Care | Payer: Federal, State, Local not specified - PPO | Source: Ambulatory Visit | Attending: Orthopedic Surgery | Admitting: Orthopedic Surgery

## 2014-06-25 DIAGNOSIS — M25512 Pain in left shoulder: Secondary | ICD-10-CM

## 2014-08-08 LAB — HM DIABETES EYE EXAM

## 2014-08-15 ENCOUNTER — Encounter: Payer: Self-pay | Admitting: Internal Medicine

## 2014-08-24 ENCOUNTER — Other Ambulatory Visit: Payer: Self-pay | Admitting: Internal Medicine

## 2014-10-22 ENCOUNTER — Ambulatory Visit (INDEPENDENT_AMBULATORY_CARE_PROVIDER_SITE_OTHER): Payer: Federal, State, Local not specified - PPO | Admitting: Internal Medicine

## 2014-10-22 ENCOUNTER — Other Ambulatory Visit (INDEPENDENT_AMBULATORY_CARE_PROVIDER_SITE_OTHER): Payer: Federal, State, Local not specified - PPO

## 2014-10-22 ENCOUNTER — Encounter: Payer: Self-pay | Admitting: Internal Medicine

## 2014-10-22 VITALS — BP 138/82 | HR 78 | Temp 98.0°F | Ht 64.5 in | Wt 240.0 lb

## 2014-10-22 DIAGNOSIS — Z0000 Encounter for general adult medical examination without abnormal findings: Secondary | ICD-10-CM

## 2014-10-22 DIAGNOSIS — G4733 Obstructive sleep apnea (adult) (pediatric): Secondary | ICD-10-CM

## 2014-10-22 DIAGNOSIS — E119 Type 2 diabetes mellitus without complications: Secondary | ICD-10-CM

## 2014-10-22 DIAGNOSIS — I10 Essential (primary) hypertension: Secondary | ICD-10-CM

## 2014-10-22 DIAGNOSIS — E669 Obesity, unspecified: Secondary | ICD-10-CM

## 2014-10-22 LAB — CBC WITH DIFFERENTIAL/PLATELET
BASOS ABS: 0.1 10*3/uL (ref 0.0–0.1)
BASOS PCT: 0.8 % (ref 0.0–3.0)
EOS PCT: 3.3 % (ref 0.0–5.0)
Eosinophils Absolute: 0.2 10*3/uL (ref 0.0–0.7)
HCT: 46.8 % (ref 39.0–52.0)
HEMOGLOBIN: 15.7 g/dL (ref 13.0–17.0)
LYMPHS PCT: 24.9 % (ref 12.0–46.0)
Lymphs Abs: 1.9 10*3/uL (ref 0.7–4.0)
MCHC: 33.5 g/dL (ref 30.0–36.0)
MCV: 91.2 fl (ref 78.0–100.0)
MONO ABS: 0.5 10*3/uL (ref 0.1–1.0)
Monocytes Relative: 6.1 % (ref 3.0–12.0)
NEUTROS PCT: 64.9 % (ref 43.0–77.0)
Neutro Abs: 4.9 10*3/uL (ref 1.4–7.7)
PLATELETS: 272 10*3/uL (ref 150.0–400.0)
RBC: 5.14 Mil/uL (ref 4.22–5.81)
RDW: 12.2 % (ref 11.5–15.5)
WBC: 7.5 10*3/uL (ref 4.0–10.5)

## 2014-10-22 LAB — URINALYSIS, ROUTINE W REFLEX MICROSCOPIC
Bilirubin Urine: NEGATIVE
HGB URINE DIPSTICK: NEGATIVE
Ketones, ur: NEGATIVE
Leukocytes, UA: NEGATIVE
Nitrite: NEGATIVE
RBC / HPF: NONE SEEN (ref 0–?)
SPECIFIC GRAVITY, URINE: 1.015 (ref 1.000–1.030)
TOTAL PROTEIN, URINE-UPE24: NEGATIVE
URINE GLUCOSE: NEGATIVE
UROBILINOGEN UA: 0.2 (ref 0.0–1.0)
pH: 6.5 (ref 5.0–8.0)

## 2014-10-22 LAB — LIPID PANEL
Cholesterol: 152 mg/dL (ref 0–200)
HDL: 50.3 mg/dL (ref >39.00)
LDL CALC: 86 mg/dL (ref 0–99)
NonHDL: 101.8
TRIGLYCERIDES: 79 mg/dL (ref 0.0–149.0)
Total CHOL/HDL Ratio: 3
VLDL: 15.8 mg/dL (ref 0.0–40.0)

## 2014-10-22 LAB — BASIC METABOLIC PANEL
BUN: 12 mg/dL (ref 6–23)
CO2: 25 mEq/L (ref 19–32)
Calcium: 8.9 mg/dL (ref 8.4–10.5)
Chloride: 107 mEq/L (ref 96–112)
Creatinine, Ser: 0.95 mg/dL (ref 0.40–1.50)
GFR: 85.74 mL/min (ref >60.00)
GLUCOSE: 109 mg/dL — AB (ref 70–99)
POTASSIUM: 4.4 meq/L (ref 3.5–5.1)
SODIUM: 139 meq/L (ref 135–145)

## 2014-10-22 LAB — HEPATIC FUNCTION PANEL
ALBUMIN: 3.8 g/dL (ref 3.5–5.2)
ALK PHOS: 52 U/L (ref 39–117)
ALT: 19 U/L (ref 0–53)
AST: 14 U/L (ref 0–37)
Bilirubin, Direct: 0.1 mg/dL (ref 0.0–0.3)
Total Bilirubin: 0.5 mg/dL (ref 0.2–1.2)
Total Protein: 6.7 g/dL (ref 6.0–8.3)

## 2014-10-22 LAB — MICROALBUMIN / CREATININE URINE RATIO
Creatinine,U: 149.9 mg/dL
Microalb Creat Ratio: 0.5 mg/g (ref 0.0–30.0)
Microalb, Ur: <0.7 mg/dL (ref 0.0–1.9)

## 2014-10-22 LAB — TSH: TSH: 1.12 u[IU]/mL (ref 0.35–4.50)

## 2014-10-22 LAB — HEMOGLOBIN A1C: Hgb A1c MFr Bld: 5.9 % (ref 4.6–6.5)

## 2014-10-22 NOTE — Progress Notes (Signed)
Pre visit review using our clinic review tool, if applicable. No additional management support is needed unless otherwise documented below in the visit note. 
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
## 2014-10-22 NOTE — Assessment & Plan Note (Signed)
Diet controlled at this time weight trends reviewed Hx unable to tolerate metformin due to GI side effects  Managing meds and home cbgs complicated by mild MR  Check a1c now and q58mo- consider other medication if a1c>7   Lab Results  Component Value Date   HGBA1C 6.6* 04/21/2014

## 2014-10-22 NOTE — Assessment & Plan Note (Signed)
Wt Readings from Last 3 Encounters:  10/22/14 240 lb (108.863 kg)  06/25/14 245 lb (111.131 kg)  05/06/14 245 lb 6 oz (111.301 kg)  weight trends reviewed The patient is asked to make an attempt to improve diet and exercise patterns to aid in medical management of this problem.

## 2014-10-22 NOTE — Assessment & Plan Note (Signed)
BP Readings from Last 3 Encounters:  10/22/14 138/82  05/06/14 142/84  04/21/14 130/82   On ramipril The current medical regimen is effective;  continue present plan and medications.
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
## 2014-11-05 ENCOUNTER — Encounter: Payer: Self-pay | Admitting: Internal Medicine

## 2015-01-11 ENCOUNTER — Ambulatory Visit (INDEPENDENT_AMBULATORY_CARE_PROVIDER_SITE_OTHER): Payer: Federal, State, Local not specified - PPO | Admitting: Family Medicine

## 2015-01-11 VITALS — BP 122/62 | HR 102 | Temp 98.7°F | Resp 20 | Ht 64.25 in | Wt 235.5 lb

## 2015-01-11 DIAGNOSIS — K5901 Slow transit constipation: Secondary | ICD-10-CM | POA: Diagnosis not present

## 2015-01-11 DIAGNOSIS — H65192 Other acute nonsuppurative otitis media, left ear: Secondary | ICD-10-CM | POA: Diagnosis not present

## 2015-01-11 MED ORDER — POLYETHYLENE GLYCOL 3350 17 GM/SCOOP PO POWD: 17.0000 g | Freq: Two times a day (BID) | ORAL | Status: DC | PRN

## 2015-01-11 MED ORDER — AMOXICILLIN 875 MG PO TABS: 875.0000 mg | ORAL_TABLET | Freq: Two times a day (BID) | ORAL | Status: DC

## 2015-01-11 NOTE — Patient Instructions (Signed)

## 2015-01-11 NOTE — Progress Notes (Addendum)
@UMFCLOGO @  This chart was scribed for Robyn Haber, MD by Thea Alken, ED Scribe. This patient was seen in room 12 and the patient's care was started at 4:15 PM.  Patient ID: Alejandro Brown MRN: [id number], DOB: 11/24/1955, 59 y.o. Date of Encounter: 01/11/2015, 4:15 PM  Primary Physician: Gwendolyn Grant, MD  Chief Complaint:  Chief Complaint  Patient presents with  . Ear Problem    pt stated that his ears feel stopped up since friday.  now has a head cold  . GI Problem    pt stated he is not able to digest his food.  started on wednesday.  denies any abd pain    HPI: 59 y.o. year old male with history below presents with left ear fullness 4 days. Pt states he had wax build up in left ear and has tried to clean his ear with Q-tips. He is unsure if he was able to remove the wax but now has decrease hearing in left ear. \ Pt denies tinnitus.   Pt works for the [employer].   Patient also complains about some mild indigestion. He says he has not had a bowel movement since Friday. He does not have any pain or diarrhea. He's had no blood in his stool. His last colonoscopy was 4 and half years ago.   Past Medical History  Diagnosis Date  . Obesity   . Mental retardation     mild  . GERD (gastroesophageal reflux disease)   . Hypertension   . 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 ALLERGIC RHINITIS   . OBSTRUCTIVE SLEEP APNEA     noncompliant with CPAP  . Type II or unspecified type diabetes mellitus without mention of complication, not stated as uncontrolled     diet controlled     Home Meds: Prior to Admission medications   Medication Sig Start Date End Date Taking? Authorizing Provider  aspirin EC 81 MG tablet Take 1 tablet (81 mg total) by mouth daily. 04/21/14  Yes Rowe Clack, MD  furosemide (LASIX) 20 MG tablet Take 1 tablet (20 mg total) by mouth daily. 04/21/14  Yes Rowe Clack, MD  multivitamin (ONE-A-DAY MEN'S) TABS tablet Take 1 tablet by mouth daily.  04/21/14  Yes Rowe Clack, MD  ramipril (ALTACE) 5 MG capsule Take 1 capsule (5 mg total) by mouth daily. 08/25/14  Yes Rowe Clack, MD    Allergies:  Allergies  Allergen Reactions  . Clarithromycin Nausea Only    Social History   Social History  . Marital Status: Single    Spouse Name: N/A  . Number of Children: N/A  . Years of Education: N/A   Occupational History  . Not on file.   Social History Main Topics  . Smoking status: Never Smoker   . Smokeless tobacco: Never Used  . Alcohol Use: No  . Drug Use: No  . Sexual Activity: Not on file   Other Topics Concern  . Not on file   Social History Narrative     Review of Systems: Constitutional: negative for chills, fever, night sweats, weight changes, or fatigue  HEENT: negative for vision changes, congestion, rhinorrhea, ST, epistaxis, or sinus pressure Cardiovascular: negative for chest pain or palpitations Respiratory: negative for hemoptysis, wheezing, shortness of breath, or cough Abdominal: negative for abdominal pain, nausea, vomiting, diarrhea, or constipation Dermatological: negative for rash Neurologic: negative for headache, dizziness, or syncope All other systems reviewed and are otherwise negative with  the exception to those above and in the HPI.   Physical Exam: Blood pressure 122/62, pulse 102, temperature 98.7 F (37.1 C), temperature source Oral, resp. rate 20, height 5' 4.25" (1.632 m), weight 235 lb 8 oz (106.822 kg), SpO2 97 %., Body mass index is 40.11 kg/(m^2). General: Well developed, well nourished, in no acute distress. Head: Normocephalic, atraumatic, eyes without discharge, sclera non-icteric, nares are without discharge. Bilateral auditory canals clear, TM's are without perforation, pearly grey and translucent with reflective cone of light bilaterally. Oral cavity moist, posterior pharynx without exudate, erythema, peritonsillar abscess, or post nasal drip.  Neck: Supple. No  thyromegaly. Full ROM. No lymphadenopathy. Lungs: Clear bilaterally to auscultation without wheezes, rales, or rhonchi. Breathing is unlabored. Heart: RRR with S1 S2. No murmurs, rubs, or gallops appreciated. Abdomen: Soft, non-tender, non-distended with normoactive bowel sounds. No hepatomegaly. No rebound/guarding. No obvious abdominal masses. Patient is morbidly obese and has a small reducible umbilical hernia Msk:  Strength and tone normal for age. Extremities/Skin: Warm and dry. No clubbing or cyanosis. No edema. No rashes or suspicious lesions. Neuro: Alert and oriented X 3. Moves all extremities spontaneously. Gait is normal. CNII-XII grossly in tact. Psych:  Responds to questions appropriately with a normal affect.    ASSESSMENT AND PLAN:  59 y.o. year old male with otitis media    ICD-9-CM ICD-10-CM   1. Other acute nonsuppurative otitis media of left ear 381.00 H65.192 amoxicillin (AMOXIL) 875 MG tablet  2. Slow transit constipation 564.01 K59.01 polyethylene glycol powder (GLYCOLAX/MIRALAX) powder    By signing my name below, I, Raven Small, attest that this documentation has been prepared under the direction and in the presence of Robyn Haber, MD.  Electronically Signed: Thea Alken, ED Scribe. 01/09/2015. 4:15 PM.  Signed, Robyn Haber, MD 01/11/2015 4:15 PM

## 2015-01-18 ENCOUNTER — Ambulatory Visit (INDEPENDENT_AMBULATORY_CARE_PROVIDER_SITE_OTHER): Payer: Federal, State, Local not specified - PPO | Admitting: Physician Assistant

## 2015-01-18 VITALS — BP 128/88 | HR 101 | Temp 98.4°F | Resp 18 | Ht 64.5 in | Wt 235.2 lb

## 2015-01-18 DIAGNOSIS — M791 Myalgia: Secondary | ICD-10-CM | POA: Diagnosis not present

## 2015-01-18 DIAGNOSIS — M7918 Myalgia, other site: Secondary | ICD-10-CM

## 2015-01-18 MED ORDER — MELOXICAM 15 MG PO TABS
15.0000 mg | ORAL_TABLET | Freq: Every day | ORAL | Status: DC
Start: 2015-01-18 — End: 2015-06-12

## 2015-01-18 NOTE — Patient Instructions (Signed)
Take the anti-inflammatory daily with food. When you return home from work, apply an ice pack to the area. That can help reduce the inflammation that is likely causing the pain you have at bedtime.

## 2015-01-18 NOTE — Progress Notes (Signed)
Patient ID: Alejandro Brown, male    DOB: 1955-05-09, 59 y.o.   MRN: [id number]  PCP: Gwendolyn Grant, MD  Subjective:   Chief Complaint  Patient presents with  . Back Pain    Unable to sleep at night due to pain. x3-4 days    HPI Presents for evaluation of low back pain x 3-4 days.  Pain only occurs at night when he is lying down to sleep. Locates the pain as being "between the buttocks." Unable to lie on his back due to the pain.  It's resolved when he gets up in the morning, and does not occur during the day with movement, bending/stooping, sitting, sit-to-stand, etc.  No urinary urgency, frequency or burning. No diarrhea or constipation (though he has some constipation when he was here 10/23). No loss of bowel or bladder control. No pain radiating down the legs or into the groin. No weakness in the legs. No numbness or tingling in the legs. No fever, chills. No nausea or vomiting.   Review of Systems As above.    Patient Active Problem List   Diagnosis Date Noted  . HTN (hypertension) 08/31/2010  . Diabetes type 2, controlled ([place]) 08/31/2010  . Obesity 08/31/2010  . Mental retardation 08/31/2010  . Dependent edema 08/31/2010  . Obstructive sleep apnea 05/05/2007  . ALLERGIC RHINITIS 05/04/2007     Prior to Admission medications   Medication Sig Start Date End Date Taking? Authorizing Provider  amoxicillin (AMOXIL) 875 MG tablet Take 1 tablet (875 mg total) by mouth 2 (two) times daily. 01/11/15  Yes Robyn Haber, MD  aspirin EC 81 MG tablet Take 1 tablet (81 mg total) by mouth daily. 04/21/14  Yes Rowe Clack, MD  furosemide (LASIX) 20 MG tablet Take 1 tablet (20 mg total) by mouth daily. 04/21/14  Yes Rowe Clack, MD  multivitamin (ONE-A-DAY MEN'S) TABS tablet Take 1 tablet by mouth daily. 04/21/14  Yes Rowe Clack, MD  polyethylene glycol powder (GLYCOLAX/MIRALAX) powder Take 17 g by mouth 2 (two) times daily as needed. 01/11/15  Yes Robyn Haber, MD  ramipril (ALTACE) 5 MG capsule Take 1 capsule (5 mg total) by mouth daily. 08/25/14  Yes Rowe Clack, MD     Allergies  Allergen Reactions  . Clarithromycin Nausea Only       Objective:  Physical Exam  Constitutional: He is oriented to person, place, and time. He appears well-developed and well-nourished. He is active and cooperative. No distress.  BP 128/88 mmHg  Pulse 101  Temp(Src) 98.4 F (36.9 C) (Oral)  Resp 18  Ht 5' 4.5" (1.638 m)  Wt 235 lb 3.2 oz (106.686 kg)  BMI 39.76 kg/m2  SpO2 96%  HENT:  Head: Normocephalic and atraumatic.  Right Ear: Hearing normal.  Left Ear: Hearing normal.  Eyes: Conjunctivae are normal. No scleral icterus.  Neck: Normal range of motion. Neck supple. No thyromegaly present.  Cardiovascular: Normal rate, regular rhythm and normal heart sounds.   Pulses:      Radial pulses are 2+ on the right side, and 2+ on the left side.  Pulmonary/Chest: Effort normal and breath sounds normal.  Abdominal: Normal appearance.  Morbidly obese  Genitourinary:  No lesions noted on the buttocks or the low back. Initially he indicated the intergluteal cleft, but no erythema or tenderness or swelling noted (as in a pilonidal cyst or perirectal abscess). The then locates the pain as being in the medial LEFT buttock. It is non-tender  on palpation. No induration.   Musculoskeletal:       Lumbar back: He exhibits normal range of motion, no tenderness, no bony tenderness, no swelling and no pain.       Back:  Lymphadenopathy:       Head (right side): No tonsillar, no preauricular, no posterior auricular and no occipital adenopathy present.       Head (left side): No tonsillar, no preauricular, no posterior auricular and no occipital adenopathy present.    He has no cervical adenopathy.       Right: No supraclavicular adenopathy present.       Left: No supraclavicular adenopathy present.  Neurological: He is alert and oriented to person,  place, and time. No sensory deficit.  Skin: Skin is warm, dry and intact. No rash noted. No cyanosis or erythema. Nails show no clubbing.  Psychiatric: He has a normal mood and affect.           Assessment & Plan:   1. Left buttock pain Possible sciatica. Trial of meloxicam and ice compress once daily after work. If symptoms worsen/persist, re-evaluate and include LS-spine films. - meloxicam (MOBIC) 15 MG tablet; Take 1 tablet (15 mg total) by mouth daily.  Dispense: 30 tablet; Refill: 0   Fara Chute, PA-C Physician Assistant-Certified [facility]

## 2015-02-22 ENCOUNTER — Other Ambulatory Visit: Payer: Self-pay | Admitting: Internal Medicine

## 2015-03-06 ENCOUNTER — Institutional Professional Consult (permissible substitution): Payer: Federal, State, Local not specified - PPO | Admitting: Internal Medicine

## 2015-04-27 ENCOUNTER — Ambulatory Visit: Payer: Federal, State, Local not specified - PPO | Admitting: Internal Medicine

## 2015-05-01 ENCOUNTER — Ambulatory Visit: Payer: Federal, State, Local not specified - PPO | Admitting: Internal Medicine

## 2015-05-31 ENCOUNTER — Other Ambulatory Visit: Payer: Self-pay | Admitting: Internal Medicine

## 2015-06-04 ENCOUNTER — Other Ambulatory Visit (HOSPITAL_COMMUNITY)
Admission: RE | Admit: 2015-06-04 | Discharge: 2015-06-04 | Disposition: A | Discharge status: Home / Self Care | Payer: Federal, State, Local not specified - PPO | Source: Ambulatory Visit | Attending: Urgent Care | Admitting: Urgent Care

## 2015-06-04 ENCOUNTER — Ambulatory Visit (INDEPENDENT_AMBULATORY_CARE_PROVIDER_SITE_OTHER): Payer: Federal, State, Local not specified - PPO | Admitting: Urgent Care

## 2015-06-04 ENCOUNTER — Other Ambulatory Visit (HOSPITAL_BASED_OUTPATIENT_CLINIC_OR_DEPARTMENT_OTHER): Payer: Federal, State, Local not specified - PPO

## 2015-06-04 ENCOUNTER — Ambulatory Visit (HOSPITAL_COMMUNITY)
Admission: RE | Admit: 2015-06-04 | Discharge: 2015-06-04 | Disposition: A | Discharge status: Home / Self Care | Payer: Federal, State, Local not specified - PPO | Source: Ambulatory Visit | Attending: Urgent Care | Admitting: Urgent Care

## 2015-06-04 ENCOUNTER — Ambulatory Visit (INDEPENDENT_AMBULATORY_CARE_PROVIDER_SITE_OTHER): Payer: Federal, State, Local not specified - PPO

## 2015-06-04 ENCOUNTER — Telehealth: Payer: Self-pay

## 2015-06-04 VITALS — BP 128/80 | HR 114 | Temp 98.6°F | Resp 16 | Ht 65.0 in | Wt 232.0 lb

## 2015-06-04 DIAGNOSIS — R1030 Lower abdominal pain, unspecified: Secondary | ICD-10-CM

## 2015-06-04 DIAGNOSIS — K460 Unspecified abdominal hernia with obstruction, without gangrene: Secondary | ICD-10-CM | POA: Diagnosis not present

## 2015-06-04 DIAGNOSIS — N400 Enlarged prostate without lower urinary tract symptoms: Secondary | ICD-10-CM | POA: Diagnosis not present

## 2015-06-04 DIAGNOSIS — R195 Other fecal abnormalities: Secondary | ICD-10-CM

## 2015-06-04 DIAGNOSIS — R112 Nausea with vomiting, unspecified: Secondary | ICD-10-CM

## 2015-06-04 DIAGNOSIS — K5900 Constipation, unspecified: Secondary | ICD-10-CM | POA: Diagnosis not present

## 2015-06-04 DIAGNOSIS — R109 Unspecified abdominal pain: Secondary | ICD-10-CM

## 2015-06-04 DIAGNOSIS — K439 Ventral hernia without obstruction or gangrene: Secondary | ICD-10-CM | POA: Insufficient documentation

## 2015-06-04 LAB — POCT CBC
GRANULOCYTE PERCENT: 72.1 % (ref 37–80)
HEMATOCRIT: 44.8 % (ref 43.5–53.7)
Hemoglobin: 16.4 g/dL (ref 14.1–18.1)
Lymph, poc: 2.3 (ref 0.6–3.4)
MCH, POC: 32.4 pg — AB (ref 27–31.2)
MCHC: 36.6 g/dL — AB (ref 31.8–35.4)
MCV: 88.4 fL (ref 80–97)
MID (CBC): 0.4 (ref 0–0.9)
MPV: 8.4 fL (ref 0–99.8)
POC Granulocyte: 7 — AB (ref 2–6.9)
POC LYMPH PERCENT: 23.7 %L (ref 10–50)
POC MID %: 4.2 %M (ref 0–12)
Platelet Count, POC: 237 10*3/uL (ref 142–424)
RBC: 5.06 M/uL (ref 4.69–6.13)
RDW, POC: 12.2 %
WBC: 9.7 10*3/uL (ref 4.6–10.2)

## 2015-06-04 LAB — POCT URINALYSIS DIP (MANUAL ENTRY)
BILIRUBIN UA: NEGATIVE
Glucose, UA: NEGATIVE
Ketones, POC UA: NEGATIVE
Leukocytes, UA: NEGATIVE
Nitrite, UA: NEGATIVE
PH UA: 6
Protein Ur, POC: NEGATIVE
RBC UA: NEGATIVE
Spec Grav, UA: 1.015
UROBILINOGEN UA: 0.2

## 2015-06-04 LAB — COMPREHENSIVE METABOLIC PANEL
ALK PHOS: 53 U/L (ref 40–115)
ALT: 16 U/L (ref 9–46)
AST: 15 U/L (ref 10–35)
Albumin: 4 g/dL (ref 3.6–5.1)
BUN: 14 mg/dL (ref 7–25)
CALCIUM: 9.6 mg/dL (ref 8.6–10.3)
CO2: 27 mmol/L (ref 20–31)
Chloride: 102 mmol/L (ref 98–110)
Creat: 1 mg/dL (ref 0.70–1.25)
GLUCOSE: 108 mg/dL — AB (ref 65–99)
Potassium: 4.7 mmol/L (ref 3.5–5.3)
Sodium: 141 mmol/L (ref 135–146)
Total Bilirubin: 0.6 mg/dL (ref 0.2–1.2)
Total Protein: 6.6 g/dL (ref 6.1–8.1)

## 2015-06-04 LAB — POC MICROSCOPIC URINALYSIS (UMFC): Mucus: ABSENT

## 2015-06-04 LAB — CREATININE, SERUM: Creatinine, Ser: 1.06 mg/dL (ref 0.61–1.24)

## 2015-06-04 LAB — BUN: BUN: 16 mg/dL (ref 6–20)

## 2015-06-04 MED ORDER — IOHEXOL 300 MG/ML  SOLN
50.0000 mL | Freq: Once | INTRAMUSCULAR | Status: AC | PRN
  Administered 2015-06-04: 50 mL via ORAL

## 2015-06-04 MED ORDER — IOHEXOL 300 MG/ML  SOLN
100.0000 mL | Freq: Once | INTRAMUSCULAR | Status: AC | PRN
Start: 2015-06-04 — End: 2015-06-04
  Administered 2015-06-04: 100 mL via INTRAVENOUS

## 2015-06-04 NOTE — Addendum Note (Signed)
Addended by: Jaynee Eagles on: 06/04/2015 07:17 PM   Modules accepted: Orders

## 2015-06-04 NOTE — Patient Instructions (Addendum)
Please call [facility] first thing tomorrow to see if they can work you in to their schedule for a consult on an incarcerated ventral hernia.  Address: [address], [place], [place] [address]  Phone: [phone]    Ventral Hernia A ventral hernia (also called an incisional hernia) is a hernia that occurs at the site of a previous surgical cut (incision) in the abdomen. The abdominal wall spans from your lower chest down to your pelvis. If the abdominal wall is weakened from a surgical incision, a hernia can occur. A hernia is a bulge of bowel or muscle tissue pushing out on the weakened part of the abdominal wall. Ventral hernias can get bigger from straining or lifting. Obese and older people are at higher risk for a ventral hernia. People who develop infections after surgery or require repeat incisions at the same site on the abdomen are also at increased risk. CAUSES  A ventral hernia occurs because of weakness in the abdominal wall at an incision site.  SYMPTOMS  Common symptoms include:  A visible bulge or lump on the abdominal wall.  Pain or tenderness around the lump.  Increased discomfort if you cough or make a sudden movement. If the hernia has blocked part of the intestine, a serious complication can occur (incarcerated or strangulated hernia). This can become a problem that requires emergency surgery because the blood flow to the blocked intestine may be cut off. Symptoms may include:  Feeling sick to your stomach (nauseous).  Throwing up (vomiting).  Stomach swelling (distention) or bloating.  Fever.  Rapid heartbeat. DIAGNOSIS  Your health care provider will take a medical history and perform a physical exam. Various tests may be ordered, such as:  Blood tests.  Urine tests.  Ultrasonography.  X-rays.  Computed tomography (CT). TREATMENT  Watchful waiting may be all that is needed for a smaller hernia that does not cause symptoms. Your  health care provider may recommend the use of a supportive belt (truss) that helps to keep the abdominal wall intact. For larger hernias or those that cause pain, surgery to repair the hernia is usually recommended. If a hernia becomes strangulated, emergency surgery needs to be done right away. HOME CARE INSTRUCTIONS  Avoid putting pressure or strain on the abdominal area.  Avoid heavy lifting.  Use good body positioning for physical tasks. Ask your health care provider about proper body positioning.  Use a supportive belt as directed by your health care provider.  Maintain a healthy weight.  Eat foods that are high in fiber, such as whole grains, fruits, and vegetables. Fiber helps prevent difficult bowel movements (constipation).  Drink enough fluids to keep your urine clear or pale yellow.  Follow up with your health care provider as directed. SEEK MEDICAL CARE IF:   Your hernia seems to be getting larger or more painful. SEEK IMMEDIATE MEDICAL CARE IF:   You have abdominal pain that is sudden and sharp.  Your pain becomes severe.  You have repeated vomiting.  You are sweating a lot.  You notice a rapid heartbeat.  You develop a fever. MAKE SURE YOU:   Understand these instructions.  Will watch your condition.  Will get help right away if you are not doing well or get worse.   This information is not intended to replace advice given to you by your health care provider. Make sure you discuss any questions you have with your health care provider.   Document Released: 02/22/2012  Document Revised: 03/28/2014 Document Reviewed: 02/22/2012 Elsevier Interactive Patient Education 2016 [place] have an appointment for a CT scan at [facility] at 130pm on 06/04/2015.      IF you received an x-ray today, you will receive an invoice from [facility]. Please contact [facility] at [phone] with questions or concerns regarding  your invoice.   IF you received labwork today, you will receive an invoice from [employer]. Please contact [facility] at [phone] with questions or concerns regarding your invoice.   Our billing staff will not be able to assist you with questions regarding bills from these companies.  You will be contacted with the lab results as soon as they are available. The fastest way to get your results is to activate your My Chart account. Instructions are located on the last page of this paperwork. If you have not heard from [place] regarding the results in 2 weeks, please contact this office.     Abdominal Pain, Adult Many things can cause abdominal pain. Usually, abdominal pain is not caused by a disease and will improve without treatment. It can often be observed and treated at home. Your health care provider will do a physical exam and possibly order blood tests and X-rays to help determine the seriousness of your pain. However, in many cases, more time must pass before a clear cause of the pain can be found. Before that point, your health care provider may not know if you need more testing or further treatment. HOME CARE INSTRUCTIONS Monitor your abdominal pain for any changes. The following actions may help to alleviate any discomfort you are experiencing:  Only take over-the-counter or prescription medicines as directed by your health care provider.  Do not take laxatives unless directed to do so by your health care provider.  Try a clear liquid diet (broth, tea, or water) as directed by your health care provider. Slowly move to a bland diet as tolerated. SEEK MEDICAL CARE IF:  You have unexplained abdominal pain.  You have abdominal pain associated with nausea or diarrhea.  You have pain when you urinate or have a bowel movement.  You experience abdominal pain that wakes you in the night.  You have abdominal pain that is worsened or improved by eating  food.  You have abdominal pain that is worsened with eating fatty foods.  You have a fever. SEEK IMMEDIATE MEDICAL CARE IF:  Your pain does not go away within 2 hours.  You keep throwing up (vomiting).  Your pain is felt only in portions of the abdomen, such as the right side or the left lower portion of the abdomen.  You pass bloody or black tarry stools. MAKE SURE YOU:  Understand these instructions.  Will watch your condition.  Will get help right away if you are not doing well or get worse.   This information is not intended to replace advice given to you by your health care provider. Make sure you discuss any questions you have with your health care provider.   Document Released: 12/15/2004 Document Revised: 11/26/2014 Document Reviewed: 11/14/2012 Elsevier Interactive Patient Education [employer].

## 2015-06-04 NOTE — Telephone Encounter (Signed)
Patient seen in clinic. Plan discussed. Refer to notes from office visit.

## 2015-06-04 NOTE — Telephone Encounter (Signed)
Tried to call Pt. For [name], but was unable to get through, he might call back ,please alert mani if he does

## 2015-06-04 NOTE — Progress Notes (Addendum)
MRN: [id number] DOB: [birth year]  Subjective:   Alejandro Brown is a 60 y.o. male presenting for chief complaint of Abdominal Pain  Reports 3 day history of nausea with vomiting x2, loose stools, now having constipation. Has not had a bowel movement since Tuesday. Currently has lower abdominal pain, feels like a knot-pressure type sensation, occurs every 15 minutes and lasts less than 1 minute. Has not tried any medications. Denies fever, bloody stools, dysuria, hematuria, flank pain, chest pain. Reports eating plenty of fiber. Patient is currently taking amoxicillin for cellulitis of his left leg, takes this every day, is refilled by a physician (cannot recall his name), last refill by Dr. Joseph Art 12/2014. However, patient is poor historian regarding medications. States that he is taken care of by a family member.  Alejandro Brown has a current medication list which includes the following prescription(s): amoxicillin, aspirin ec, furosemide, meloxicam, multivitamin, and ramipril. Also is allergic to clarithromycin.  Alejandro Brown  has a past medical history of Obesity; Mental retardation; GERD (gastroesophageal reflux disease); Hypertension; Dependent edema; ALLERGIC RHINITIS; OBSTRUCTIVE SLEEP APNEA; and Type II or unspecified type diabetes mellitus without mention of complication, not stated as uncontrolled. Also  has past surgical history that includes Nasal septum surgery and Rotator cuff repair (Left, 06/2014).  Objective:   Vitals: BP 128/80 mmHg  Pulse 114  Temp(Src) 98.6 F (37 C) (Oral)  Resp 16  Ht 5\' 5"  (1.651 m)  Wt 232 lb (105.235 kg)  BMI 38.61 kg/m2  SpO2 96%  Physical Exam  Constitutional: He is oriented to person, place, and time. He appears well-developed and well-nourished.  HENT:  Mouth/Throat: Oropharynx is clear and moist.  Eyes: Right eye exhibits no discharge. Left eye exhibits no discharge. No scleral icterus.  Neck: Normal range of motion. Neck supple.    Cardiovascular: Normal rate, regular rhythm and intact distal pulses.  Exam reveals no gallop and no friction rub.   No murmur heard. Pulmonary/Chest: No respiratory distress. He has no wheezes. He has no rales.  Abdominal: Soft. Bowel sounds are normal. He exhibits no distension and no mass. There is tenderness (generalized epigastric, worst over left side).  Neurological: He is alert and oriented to person, place, and time.  Skin: Skin is warm and dry.   Results for orders placed or performed in visit on 06/04/15 (from the past 24 hour(s))  POCT urinalysis dipstick     Status: None   Collection Time: 06/04/15 11:51 AM  Result Value Ref Range   Color, UA yellow yellow   Clarity, UA clear clear   Glucose, UA negative negative   Bilirubin, UA negative negative   Ketones, POC UA negative negative   Spec Grav, UA 1.015    Blood, UA negative negative   pH, UA 6.0    Protein Ur, POC negative negative   Urobilinogen, UA 0.2    Nitrite, UA Negative Negative   Leukocytes, UA Negative Negative  POCT Microscopic Urinalysis ([facility])     Status: Abnormal   Collection Time: 06/04/15 11:51 AM  Result Value Ref Range   WBC,UR,HPF,POC Few (A) None WBC/hpf   RBC,UR,HPF,POC None None RBC/hpf   Bacteria None None, Too numerous to count   Mucus Absent Absent   Epithelial Cells, UR Per Microscopy None None, Too numerous to count cells/hpf   Dg Abd 1 View  06/04/2015  CLINICAL DATA:  Lower abdominal pain. EXAM: ABDOMEN - 1 VIEW COMPARISON:  None. FINDINGS: The bowel gas pattern is normal. No radio-opaque calculi  or other significant radiographic abnormality are seen. IMPRESSION: No evidence of bowel obstruction or ileus. Electronically Signed   By: Marijo Conception, M.D.   On: 06/04/2015 11:43   Assessment and Plan :   1. Left sided abdominal pain 2. Lower abdominal pain 3. Loose stools 4. Constipation, unspecified constipation type 5. Nausea and vomiting, vomiting of unspecified type - Will send  for stat CT abdomen, r/o of diverticulitis versus other acute abdominal process. Labs pending.  Jaynee Eagles, PA-C [facility] [phone] 06/04/2015 11:20 AM   UPDATE:  Dg Abd 1 View  06/04/2015  CLINICAL DATA:  Lower abdominal pain. EXAM: ABDOMEN - 1 VIEW COMPARISON:  None. FINDINGS: The bowel gas pattern is normal. No radio-opaque calculi or other significant radiographic abnormality are seen. IMPRESSION: No evidence of bowel obstruction or ileus. Electronically Signed   By: Marijo Conception, M.D.   On: 06/04/2015 11:43   Ct Abdomen Pelvis W Contrast  06/04/2015  CLINICAL DATA:  Lower abdominal pain. Nausea and vomiting for 3 days. Constipation. EXAM: CT ABDOMEN AND PELVIS WITH CONTRAST TECHNIQUE: Multidetector CT imaging of the abdomen and pelvis was performed using the standard protocol following bolus administration of intravenous contrast. CONTRAST:  61mL OMNIPAQUE IOHEXOL 300 MG/ML SOLN, 172mL OMNIPAQUE IOHEXOL 300 MG/ML SOLN COMPARISON:  None. FINDINGS: Small hiatal hernia suspected. Tiny sub cm hypodensity in the anterior right lobe of the liver on image 14. Gallbladder, spleen, pancreas, adrenal glands, and kidneys are within normal limits. Moderate stool burden throughout the length of the colon is noted. No obstructing lesion. Normal appendix. Retro aortic left renal vein anatomy. Bladder is unremarkable. Prostate is enlarged and lobulated with extension into the base of the bladder. Ventral hernia contains adipose tissue. The hernia sac measures 5.6 cm in diameter and is about the emboli kiss. No free-fluid. No abnormal retroperitoneal adenopathy by measurement criteria. No destructive bone lesion.  No vertebral compression deformity. IMPRESSION: Tiny hypodensity in the liver is statistically benign. If the patient has a history of malignancy or is at high risk for malignancy, six-month follow-up is recommended by MRI. Moderate stool burden in  the colon. Ventral hernia contains adipose tissue. Prostate gland is enlarged and lobulated. Correlation with physical exam and PSA level is warranted. Electronically Signed   By: Marybelle Killings M.D.   On: 06/04/2015 16:31   Discussed results with patient, working diagnosis is incarcerated ventral hernia. He is not in distress and does not currently feel abdominal pain. His pulse is 88. I presented patient with 2 options. I advised that he could report to the [facility] ED today for emergent consult with a general surgeon. Or we could place an urgent referral to [facility] for urgent consult. He opted for the latter with the help of his care-giver whom we reached by phone while patient was in the clinic. Referral is pending. Patient and his caregiver will also call tomorrow to advocate for patient's consult.

## 2015-06-04 NOTE — Telephone Encounter (Signed)
I called Alejandro Brown at 6:35 pm because I have not heard anything from her or Mr. Laconte. She stated she has not been able to get in touch with him either. [name] PA-C notified and he will call Alejandro Brown and talk to her. Alejandro Brown [phone]

## 2015-06-04 NOTE — Telephone Encounter (Signed)
CT tech called office with CT report. Report in Epic. Stated patient told her he was supposed to return to office for results and said he should be on his way. Patient did not show up, we have called him a few times, no answer, unable to leave voice mail. Found emergency contact for patient, Lyndee Leo, sister-in-law, and she is going to try and get in touch with him and either she or him will call me back. He called her after his CT and told her he was on his way back to office, never showed. Back line given to [facility] for return call.

## 2015-06-04 NOTE — Telephone Encounter (Signed)
CT tech called with report and to notify us he was on way back to our office at 4:38pm

## 2015-06-05 ENCOUNTER — Encounter (HOSPITAL_COMMUNITY): Payer: Self-pay | Admitting: Nurse Practitioner

## 2015-06-05 ENCOUNTER — Emergency Department (HOSPITAL_COMMUNITY)
Admission: EM | Admit: 2015-06-05 | Discharge: 2015-06-05 | Disposition: A | Discharge status: Home / Self Care | Payer: Federal, State, Local not specified - PPO | Attending: Emergency Medicine | Admitting: Emergency Medicine

## 2015-06-05 DIAGNOSIS — I10 Essential (primary) hypertension: Secondary | ICD-10-CM | POA: Diagnosis not present

## 2015-06-05 DIAGNOSIS — Z7982 Long term (current) use of aspirin: Secondary | ICD-10-CM | POA: Diagnosis not present

## 2015-06-05 DIAGNOSIS — E119 Type 2 diabetes mellitus without complications: Secondary | ICD-10-CM | POA: Diagnosis not present

## 2015-06-05 DIAGNOSIS — K429 Umbilical hernia without obstruction or gangrene: Secondary | ICD-10-CM | POA: Insufficient documentation

## 2015-06-05 DIAGNOSIS — Z8659 Personal history of other mental and behavioral disorders: Secondary | ICD-10-CM | POA: Diagnosis not present

## 2015-06-05 DIAGNOSIS — E669 Obesity, unspecified: Secondary | ICD-10-CM | POA: Insufficient documentation

## 2015-06-05 DIAGNOSIS — Z79899 Other long term (current) drug therapy: Secondary | ICD-10-CM | POA: Insufficient documentation

## 2015-06-05 DIAGNOSIS — Z792 Long term (current) use of antibiotics: Secondary | ICD-10-CM | POA: Insufficient documentation

## 2015-06-05 DIAGNOSIS — Z8669 Personal history of other diseases of the nervous system and sense organs: Secondary | ICD-10-CM | POA: Diagnosis not present

## 2015-06-05 LAB — PSA: PSA: 6.18 ng/mL — ABNORMAL HIGH (ref <=4.00)

## 2015-06-05 LAB — SEDIMENTATION RATE: Sed Rate: 11 mm/hr (ref 0–20)

## 2015-06-05 MED ORDER — POLYETHYLENE GLYCOL 3350 17 G PO PACK: 17.0000 g | PACK | Freq: Every day | ORAL | Status: DC

## 2015-06-05 MED ORDER — DOCUSATE SODIUM 100 MG PO CAPS: 100.0000 mg | ORAL_CAPSULE | Freq: Two times a day (BID) | ORAL | Status: DC

## 2015-06-05 NOTE — ED Notes (Addendum)
Pt vomited on Tuesday and had abd pain seen at [facility] yesterday and sent for CT abd was told he had 2  hernias and to F/u with [facility] they cannot see him so was told to come here

## 2015-06-05 NOTE — Discharge Instructions (Signed)
Hernia, Adult A hernia is the bulging of an organ or tissue through a weak spot in the muscles of the abdomen (abdominal wall). Hernias develop most often near the navel or groin. There are many kinds of hernias. Common kinds include:  Femoral hernia. This kind of hernia develops under the groin in the upper thigh area.  Inguinal hernia. This kind of hernia develops in the groin or scrotum.  Umbilical hernia. This kind of hernia develops near the navel.  Hiatal hernia. This kind of hernia causes part of the stomach to be pushed up into the chest.  Incisional hernia. This kind of hernia bulges through a scar from an abdominal surgery. CAUSES This condition may be caused by:  Heavy lifting.  Coughing over a long period of time.  Straining to have a bowel movement.  An incision made during an abdominal surgery.  A birth defect (congenital defect).  Excess weight or obesity.  Smoking.  Poor nutrition.  Cystic fibrosis.  Excess fluid in the abdomen.  Undescended testicles. SYMPTOMS Symptoms of a hernia include:  A lump on the abdomen. This is the first sign of a hernia. The lump may become more obvious with standing, straining, or coughing. It may get bigger over time if it is not treated or if the condition causing it is not treated.  Pain. A hernia is usually painless, but it may become painful over time if treatment is delayed. The pain is usually dull and may get worse with standing or lifting heavy objects. Sometimes a hernia gets tightly squeezed in the weak spot (strangulated) or stuck there (incarcerated) and causes additional symptoms. These symptoms may include:  Vomiting.  Nausea.  Constipation.  Irritability. DIAGNOSIS A hernia may be diagnosed with:  A physical exam. During the exam your health care provider may ask you to cough or to make a specific movement, because a hernia is usually more visible when you move.  Imaging tests. These can  include:  X-rays.  Ultrasound.  CT scan. TREATMENT A hernia that is small and painless may not need to be treated. A hernia that is large or painful may be treated with surgery. Inguinal hernias may be treated with surgery to prevent incarceration or strangulation. Strangulated hernias are always treated with surgery, because lack of blood to the trapped organ or tissue can cause it to die. Surgery to treat a hernia involves pushing the bulge back into place and repairing the weak part of the abdomen. HOME CARE INSTRUCTIONS  Avoid straining.  Do not lift anything heavier than 10 lb (4.5 kg).  Lift with your leg muscles, not your back muscles. This helps avoid strain.  When coughing, try to cough gently.  Prevent constipation. Constipation leads to straining with bowel movements, which can make a hernia worse or cause a hernia repair to break down. You can prevent constipation by:  Eating a high-fiber diet that includes plenty of fruits and vegetables.  Drinking enough fluids to keep your urine clear or pale yellow. Aim to drink 6-8 glasses of water per day.  Using a stool softener as directed by your health care provider.  Lose weight, if you are overweight.  Do not use any tobacco products, including cigarettes, chewing tobacco, or electronic cigarettes. If you need help quitting, ask your health care provider.  Keep all follow-up visits as directed by your health care provider. This is important. Your health care provider may need to monitor your condition. SEEK MEDICAL CARE IF:  You have   swelling, redness, and pain in the affected area.  Your bowel habits change. SEEK IMMEDIATE MEDICAL CARE IF:  You have a fever.  You have abdominal pain that is getting worse.  You feel nauseous or you vomit.  You cannot push the hernia back in place by gently pressing on it while you are lying down.  The hernia:  Changes in shape or size.  Is stuck outside the  abdomen.  Becomes discolored.  Feels hard or tender.   This information is not intended to replace advice given to you by your health care provider. Make sure you discuss any questions you have with your health care provider.   Document Released: 03/07/2005 Document Revised: 03/28/2014 Document Reviewed: 01/15/2014 Elsevier Interactive Patient Education 2016 [employer]  

## 2015-06-05 NOTE — ED Provider Notes (Signed)
CSN: [id number]     Arrival date & time 06/05/15  1209 History  By signing my name below, I, Rayna Sexton, attest that this documentation has been prepared under the direction and in the presence of Caryl Ada, [place]. Electronically Signed: Rayna Sexton, ED Scribe. 06/05/2015. 3:29 PM.    Chief Complaint  Patient presents with  . Hernia   The history is provided by the patient. No language interpreter was used.   HPI Comments: Alejandro Brown is a 60 y.o. male with a PMHx of DM who presents to the Emergency Department complaining of worsening, moderate, constipation onset 3 days ago. He initially began experiencing n/v/d 4 days ago which alleviated and resulted in his constipation. He denies taking any medications for his symptoms. Pt had a CT abdomen performed yesterday at [facility] showing a hernia and was instructed to follow up with [facility] who cannot see him for 8 days. He has a follow up appointment with Dr. Quay Burow, his listed PCP, in 1 week. He denies any other associated symptoms at this time.   Past Medical History  Diagnosis Date  . Obesity   . Mental retardation     mild  . GERD (gastroesophageal reflux disease)   . Hypertension   . 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 ALLERGIC RHINITIS   . OBSTRUCTIVE SLEEP APNEA     noncompliant with CPAP  . Type II or unspecified type diabetes mellitus without mention of complication, not stated as uncontrolled     diet controlled   Past Surgical History  Procedure Laterality Date  . Nasal septum surgery    . Rotator cuff repair Left 06/2014    [name]   Family History  Problem Relation Age of Onset  . Breast cancer Sister   . Breast cancer Other   . Colon cancer Neg Hx    Social History  Substance Use Topics  . Smoking status: Never Smoker   . Smokeless tobacco: Never Used  . Alcohol Use: No    Review of Systems A complete 10 system review of systems was obtained and all systems are negative except as noted  in the HPI and PMH.   Allergies  Clarithromycin  Home Medications   Prior to Admission medications   Medication Sig Start Date End Date Taking? Authorizing Provider  amoxicillin (AMOXIL) 875 MG tablet Take 1 tablet (875 mg total) by mouth 2 (two) times daily. 01/11/15   Robyn Haber, MD  aspirin EC 81 MG tablet Take 1 tablet (81 mg total) by mouth daily. 04/21/14   Rowe Clack, MD  furosemide (LASIX) 20 MG tablet take 1 tablet by mouth once daily 06/01/15   Binnie Rail, MD  meloxicam (MOBIC) 15 MG tablet Take 1 tablet (15 mg total) by mouth daily. 01/18/15   Chelle Jeffery, PA-C  multivitamin (ONE-A-DAY MEN'S) TABS tablet Take 1 tablet by mouth daily. 04/21/14   Rowe Clack, MD  ramipril (ALTACE) 5 MG capsule Take 1 capsule (5 mg total) by mouth daily. 08/25/14   Rowe Clack, MD   BP 144/80 mmHg  Pulse 100  Temp(Src) 98.6 F (37 C) (Oral)  Resp 20  SpO2 99%    Physical Exam  Constitutional: He is oriented to person, place, and time. He appears well-developed and well-nourished.  HENT:  Head: Normocephalic and atraumatic.  Eyes: EOM are normal.  Neck: Normal range of motion.  Cardiovascular: Normal rate.   Pulmonary/Chest: Effort normal. No respiratory  distress.  Abdominal: Soft. There is no tenderness.  2 cm soft easily reducible umbilicus; otherwise nml abd exam  Musculoskeletal: Normal range of motion.  Neurological: He is alert and oriented to person, place, and time.  Skin: Skin is warm and dry.  Psychiatric: He has a normal mood and affect.  Nursing note and vitals reviewed.   ED Course  Procedures  DIAGNOSTIC STUDIES: Oxygen Saturation is 99% on RA, normal by my interpretation.    COORDINATION OF CARE: 3:27 PM Discussed next steps with pt. He verbalized understanding and is agreeable with the plan.   Labs Review Labs Reviewed - No data to display  Imaging Review Dg Abd 1 View  06/04/2015  CLINICAL DATA:  Lower abdominal pain. EXAM:  ABDOMEN - 1 VIEW COMPARISON:  None. FINDINGS: The bowel gas pattern is normal. No radio-opaque calculi or other significant radiographic abnormality are seen. IMPRESSION: No evidence of bowel obstruction or ileus. Electronically Signed   By: Marijo Conception, M.D.   On: 06/04/2015 11:43   Ct Abdomen Pelvis W Contrast  06/04/2015  CLINICAL DATA:  Lower abdominal pain. Nausea and vomiting for 3 days. Constipation. EXAM: CT ABDOMEN AND PELVIS WITH CONTRAST TECHNIQUE: Multidetector CT imaging of the abdomen and pelvis was performed using the standard protocol following bolus administration of intravenous contrast. CONTRAST:  76mL OMNIPAQUE IOHEXOL 300 MG/ML SOLN, 128mL OMNIPAQUE IOHEXOL 300 MG/ML SOLN COMPARISON:  None. FINDINGS: Small hiatal hernia suspected. Tiny sub cm hypodensity in the anterior right lobe of the liver on image 14. Gallbladder, spleen, pancreas, adrenal glands, and kidneys are within normal limits. Moderate stool burden throughout the length of the colon is noted. No obstructing lesion. Normal appendix. Retro aortic left renal vein anatomy. Bladder is unremarkable. Prostate is enlarged and lobulated with extension into the base of the bladder. Ventral hernia contains adipose tissue. The hernia sac measures 5.6 cm in diameter and is about the emboli kiss. No free-fluid. No abnormal retroperitoneal adenopathy by measurement criteria. No destructive bone lesion.  No vertebral compression deformity. IMPRESSION: Tiny hypodensity in the liver is statistically benign. If the patient has a history of malignancy or is at high risk for malignancy, six-month follow-up is recommended by MRI. Moderate stool burden in the colon. Ventral hernia contains adipose tissue. Prostate gland is enlarged and lobulated. Correlation with physical exam and PSA level is warranted. Electronically Signed   By: Marybelle Killings M.D.   On: 06/04/2015 16:31   I have personally reviewed and evaluated these images as part of my  medical decision-making.   EKG Interpretation None      MDM   Final diagnoses:  Umbilical hernia without obstruction and without gangrene    Reviewed pt's CT scan. Pt has significant constipation consistent with his hx. Will treat with Miralax and Colace and recommended he follow up with his PCP.  An After Visit Summary was printed and given to the patient. Meds ordered this encounter  Medications  . docusate sodium (COLACE) 100 MG capsule    Sig: Take 1 capsule (100 mg total) by mouth every 12 (twelve) hours.    Dispense:  60 capsule    Refill:  0    Order Specific Question:  Supervising Provider    Answer:  Ezequiel Essex [[id number]]  . polyethylene glycol (MIRALAX) packet    Sig: Take 17 g by mouth daily.    Dispense:  14 each    Refill:  0    Order Specific Question:  Supervising Provider  AnswerEzequiel Essex Henefer, PA-C 06/05/15 1831  Julianne Rice, MD 06/10/15 [phone]

## 2015-06-05 NOTE — ED Notes (Addendum)
Pt had ct scan yesterday that showed a hernia. He was instructed to f/u with [facility] [place] [facility] but they can not see him for 8 days and he does not want to wait that long. He denies any pain today. He is A&Ox4, resp e/u

## 2015-06-11 ENCOUNTER — Other Ambulatory Visit: Payer: Self-pay | Admitting: Physician Assistant

## 2015-06-11 DIAGNOSIS — R972 Elevated prostate specific antigen [PSA]: Secondary | ICD-10-CM

## 2015-06-12 ENCOUNTER — Encounter: Payer: Self-pay | Admitting: Internal Medicine

## 2015-06-12 ENCOUNTER — Ambulatory Visit (INDEPENDENT_AMBULATORY_CARE_PROVIDER_SITE_OTHER): Payer: Federal, State, Local not specified - PPO | Admitting: Internal Medicine

## 2015-06-12 ENCOUNTER — Other Ambulatory Visit (INDEPENDENT_AMBULATORY_CARE_PROVIDER_SITE_OTHER): Payer: Federal, State, Local not specified - PPO

## 2015-06-12 VITALS — BP 142/84 | HR 67 | Temp 98.2°F | Resp 16 | Wt 234.0 lb

## 2015-06-12 DIAGNOSIS — K439 Ventral hernia without obstruction or gangrene: Secondary | ICD-10-CM

## 2015-06-12 DIAGNOSIS — N400 Enlarged prostate without lower urinary tract symptoms: Secondary | ICD-10-CM | POA: Diagnosis not present

## 2015-06-12 DIAGNOSIS — I10 Essential (primary) hypertension: Secondary | ICD-10-CM

## 2015-06-12 DIAGNOSIS — R609 Edema, unspecified: Secondary | ICD-10-CM

## 2015-06-12 DIAGNOSIS — E119 Type 2 diabetes mellitus without complications: Secondary | ICD-10-CM

## 2015-06-12 DIAGNOSIS — E669 Obesity, unspecified: Secondary | ICD-10-CM

## 2015-06-12 DIAGNOSIS — R972 Elevated prostate specific antigen [PSA]: Secondary | ICD-10-CM

## 2015-06-12 LAB — HEMOGLOBIN A1C: HEMOGLOBIN A1C: 6.3 % (ref 4.6–6.5)

## 2015-06-12 LAB — COMPREHENSIVE METABOLIC PANEL
ALBUMIN: 4 g/dL (ref 3.5–5.2)
ALT: 17 U/L (ref 0–53)
AST: 14 U/L (ref 0–37)
Alkaline Phosphatase: 52 U/L (ref 39–117)
BILIRUBIN TOTAL: 0.6 mg/dL (ref 0.2–1.2)
BUN: 15 mg/dL (ref 6–23)
CALCIUM: 9.2 mg/dL (ref 8.4–10.5)
CO2: 28 mEq/L (ref 19–32)
CREATININE: 1.04 mg/dL (ref 0.40–1.50)
Chloride: 103 mEq/L (ref 96–112)
GFR: 77.07 mL/min (ref >60.00)
Glucose, Bld: 111 mg/dL — ABNORMAL HIGH (ref 70–99)
Potassium: 4.5 mEq/L (ref 3.5–5.1)
Sodium: 138 mEq/L (ref 135–145)
Total Protein: 7 g/dL (ref 6.0–8.3)

## 2015-06-12 LAB — LIPID PANEL
CHOLESTEROL: 139 mg/dL (ref 0–200)
HDL: 43.7 mg/dL (ref >39.00)
LDL Cholesterol: 84 mg/dL (ref 0–99)
NonHDL: 95.49
Total CHOL/HDL Ratio: 3
Triglycerides: 56 mg/dL (ref 0.0–149.0)
VLDL: 11.2 mg/dL (ref 0.0–40.0)

## 2015-06-12 NOTE — Assessment & Plan Note (Signed)
Will see urology 

## 2015-06-12 NOTE — Patient Instructions (Signed)
Follow up with urology   Test(s) ordered today. Your results will be released to [place] (or called to you) after review, usually within 72hours after test completion. If any changes need to be made, you will be notified at that same time.  All other Health Maintenance issues reviewed.   All recommended immunizations and age-appropriate screenings are up-to-date or discussed.  No immunizations administered today.   Medications reviewed and updated.  No changes recommended at this time.    Please followup in 46months

## 2015-06-12 NOTE — Assessment & Plan Note (Signed)
Check a1c Not on any medication now Stressed weight loss to help avoid medication

## 2015-06-12 NOTE — Assessment & Plan Note (Signed)
One recent episode of abdominal pain - likely from constipation Hernia has not caused any pain Monitor for now - no surgery needed

## 2015-06-12 NOTE — Progress Notes (Signed)
Pre visit review using our clinic review tool, if applicable. No additional management support is needed unless otherwise documented below in the visit note. 

## 2015-06-12 NOTE — Assessment & Plan Note (Signed)
Decrease portions Weight loss stressed

## 2015-06-12 NOTE — Progress Notes (Signed)
Subjective:    Patient ID: Alejandro Brown, male    DOB: [birth year], 60 y.o.   MRN: [id number]  HPI He is here to establish with a new pcp.   He is here for follow up.   Hypertension: He is taking his medication daily. He is not compliant with a low sodium diet.  He denies chest pain, palpitations, and regular headaches. He is very active at work.  He does not monitor his blood pressure at home.    Diabetes: He is taking his medication daily as prescribed. He is compliant with a diabetic diet. He is very active at work.  He is up-to-date with an ophthalmology examination.   Edema:  He takes lasix daily.  He wears a compression stocking on the left side.  He had cellulitis twice in that leg.  He gets minimal swelling in the right leg.  His swelling is controlled    Ventral hernia:  He had pain Monday night and went to urgent care. The pain was determined to likely be from constipation. He feels his bowel movements are normal and does not feel constipated.  He denies pain in the past from the hernia.   His psa was elevated and prostate looked enlarged on his recent ct scan.  He was advised to follow up with urology and his sister-in-law will help arrange that.    He still works at [employer] and does a lot of lifting and walking.    Medications and allergies reviewed with patient and updated if appropriate.  Patient Active Problem List   Diagnosis Date Noted  . HTN (hypertension) 08/31/2010  . Diabetes type 2, controlled ([place]) 08/31/2010  . Obesity 08/31/2010  . Mental retardation 08/31/2010  . Dependent edema 08/31/2010  . Obstructive sleep apnea 05/05/2007  . ALLERGIC RHINITIS 05/04/2007    Current Outpatient Prescriptions on File Prior to Visit  Medication Sig Dispense Refill  . aspirin EC 81 MG tablet Take 1 tablet (81 mg total) by mouth daily. 150 tablet 2  . furosemide (LASIX) 20 MG tablet take 1 tablet by mouth once daily 90 tablet 1  . multivitamin (ONE-A-DAY  MEN'S) TABS tablet Take 1 tablet by mouth daily.  0  . ramipril (ALTACE) 5 MG capsule Take 1 capsule (5 mg total) by mouth daily. 90 capsule 3  . docusate sodium (COLACE) 100 MG capsule Take 1 capsule (100 mg total) by mouth every 12 (twelve) hours. (Patient not taking: Reported on 06/12/2015) 60 capsule 0  . polyethylene glycol (MIRALAX) packet Take 17 g by mouth daily. (Patient not taking: Reported on 06/12/2015) 14 each 0   No current facility-administered medications on file prior to visit.    Past Medical History  Diagnosis Date  . Obesity   . Mental retardation     mild  . GERD (gastroesophageal reflux disease)   . Hypertension   . 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 ALLERGIC RHINITIS   . OBSTRUCTIVE SLEEP APNEA     noncompliant with CPAP  . Type II or unspecified type diabetes mellitus without mention of complication, not stated as uncontrolled     diet controlled    Past Surgical History  Procedure Laterality Date  . Nasal septum surgery    . Rotator cuff repair Left 06/2014    [name]    Social History   Social History  . Marital Status: Single    Spouse Name: n/a  . Number of Children: 0  .  Years of Education: 12th grade   Occupational History  . [employer]     Custodian/maintenance   Social History Main Topics  . Smoking status: Never Smoker   . Smokeless tobacco: Never Used  . Alcohol Use: No  . Drug Use: No  . Sexual Activity: Not Asked   Other Topics Concern  . 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    Family History  Problem Relation Age of Onset  . Breast cancer Sister   . Breast cancer Other   . Colon cancer Neg Hx     Review of Systems  Constitutional: Negative for fever and appetite change.  Respiratory: Positive for shortness of breath (mild). Negative for cough and wheezing.   Cardiovascular: Positive for leg swelling. Negative for chest pain and palpitations.   Gastrointestinal: Positive for abdominal pain (just the one episode). Negative for constipation.  Neurological: Negative for dizziness, light-headedness, numbness and headaches.       Objective:   Filed Vitals:   06/12/15 1003  BP: 142/84  Pulse: 67  Temp: 98.2 F (36.8 C)  Resp: 16   Filed Weights   06/12/15 1003  Weight: 234 lb (106.142 kg)   Body mass index is 38.94 kg/(m^2).   Physical Exam Constitutional: Appears well-developed and well-nourished. No distress.  Neck: Neck supple. No tracheal deviation present. No thyromegaly present.  No carotid bruit. No cervical adenopathy.   Cardiovascular: Normal rate, regular rhythm and normal heart sounds.   2/6 systolic murmur.  Trace edema on right, compression on left Pulmonary/Chest: Effort normal and breath sounds normal. No respiratory distress. No wheezes.        Assessment & Plan:    See Problem List for Assessment and Plan of chronic medical problems.  Follow up in 6 months

## 2015-06-12 NOTE — Assessment & Plan Note (Signed)
High her today, but typically better controlled Continue current medication Work on lifestyle - low sodium, weight loss Check cmp

## 2015-06-14 ENCOUNTER — Encounter: Payer: Self-pay | Admitting: Internal Medicine

## 2015-08-14 DIAGNOSIS — H2513 Age-related nuclear cataract, bilateral: Secondary | ICD-10-CM | POA: Diagnosis not present

## 2015-08-14 DIAGNOSIS — Z0000 Encounter for general adult medical examination without abnormal findings: Secondary | ICD-10-CM | POA: Diagnosis not present

## 2015-08-14 DIAGNOSIS — N138 Other obstructive and reflux uropathy: Secondary | ICD-10-CM | POA: Diagnosis not present

## 2015-08-14 DIAGNOSIS — E119 Type 2 diabetes mellitus without complications: Secondary | ICD-10-CM | POA: Diagnosis not present

## 2015-08-14 DIAGNOSIS — R972 Elevated prostate specific antigen [PSA]: Secondary | ICD-10-CM | POA: Diagnosis not present

## 2015-08-14 DIAGNOSIS — N401 Enlarged prostate with lower urinary tract symptoms: Secondary | ICD-10-CM | POA: Diagnosis not present

## 2015-08-14 DIAGNOSIS — H5203 Hypermetropia, bilateral: Secondary | ICD-10-CM | POA: Diagnosis not present

## 2015-08-14 LAB — HM DIABETES EYE EXAM

## 2015-08-20 ENCOUNTER — Encounter: Payer: Self-pay | Admitting: Internal Medicine

## 2015-09-01 ENCOUNTER — Other Ambulatory Visit: Payer: Self-pay | Admitting: Internal Medicine

## 2015-09-30 ENCOUNTER — Ambulatory Visit (INDEPENDENT_AMBULATORY_CARE_PROVIDER_SITE_OTHER): Payer: Federal, State, Local not specified - PPO | Admitting: Urgent Care

## 2015-09-30 VITALS — BP 112/76 | HR 82 | Temp 98.2°F | Resp 16 | Ht 64.0 in | Wt 229.0 lb

## 2015-09-30 DIAGNOSIS — R05 Cough: Secondary | ICD-10-CM

## 2015-09-30 DIAGNOSIS — J302 Other seasonal allergic rhinitis: Secondary | ICD-10-CM

## 2015-09-30 DIAGNOSIS — R0981 Nasal congestion: Secondary | ICD-10-CM | POA: Diagnosis not present

## 2015-09-30 DIAGNOSIS — J029 Acute pharyngitis, unspecified: Secondary | ICD-10-CM | POA: Diagnosis not present

## 2015-09-30 DIAGNOSIS — J069 Acute upper respiratory infection, unspecified: Secondary | ICD-10-CM | POA: Diagnosis not present

## 2015-09-30 DIAGNOSIS — R059 Cough, unspecified: Secondary | ICD-10-CM

## 2015-09-30 MED ORDER — CETIRIZINE HCL 10 MG PO TABS: 10.0000 mg | ORAL_TABLET | Freq: Every day | ORAL | Status: DC

## 2015-09-30 MED ORDER — BENZONATATE 100 MG PO CAPS: 100.0000 mg | ORAL_CAPSULE | Freq: Three times a day (TID) | ORAL | Status: DC | PRN

## 2015-09-30 MED ORDER — HYDROCODONE-HOMATROPINE 5-1.5 MG/5ML PO SYRP: 5.0000 mL | ORAL_SOLUTION | Freq: Every evening | ORAL | Status: DC | PRN

## 2015-09-30 MED ORDER — PSEUDOEPHEDRINE HCL 60 MG PO TABS: 60.0000 mg | ORAL_TABLET | Freq: Three times a day (TID) | ORAL | Status: DC | PRN

## 2015-09-30 NOTE — Progress Notes (Signed)
    MRN: [id number] DOB: 1955/11/24  Subjective:   Alejandro Brown is a 60 y.o. male presenting for chief complaint of Sinus Problem and Sore Throat  Reports 4 day history of sinus congestion, sore throat, mild intermittent dry cough. Has not tried any medications to help with symptoms. Denies fever, sinus pain, itchy eyes, ear pain, ear drainage, chest pain, shob, n/v, abdominal pain, rashes. Has seasonal allergies, does not take any medications consistently for this. Denies smoking cigarettes.  Cjay has a current medication list which includes the following prescription(s): aspirin ec, docusate sodium, furosemide, multivitamin, polyethylene glycol, and ramipril. Also is allergic to clarithromycin.  Jonty  has a past medical history of Obesity; Mental retardation; GERD (gastroesophageal reflux disease); Hypertension; Dependent edema; ALLERGIC RHINITIS; OBSTRUCTIVE SLEEP APNEA; and Type II or unspecified type diabetes mellitus without mention of complication, not stated as uncontrolled. Also  has past surgical history that includes Nasal septum surgery and Rotator cuff repair (Left, 06/2014).  Objective:   Vitals: BP 112/76 mmHg  Pulse 82  Temp(Src) 98.2 F (36.8 C) (Oral)  Resp 16  Ht 5\' 4"  (1.626 m)  Wt 229 lb (103.874 kg)  BMI 39.29 kg/m2  SpO2 94%  Physical Exam  Constitutional: He is oriented to person, place, and time. He appears well-developed and well-nourished.  HENT:  TM's flat bilaterally, no effusions or erythema. Nasal turbinates boggy and edematous, nasal passages patent. No sinus tenderness. Oropharynx with post-nasal drainage and slight erythema but no exudates, tonsillar edema, mucous membranes moist, dentition in good repair.  Eyes: Right eye exhibits no discharge. Left eye exhibits no discharge. No scleral icterus.  Neck: Normal range of motion. Neck supple.  Cardiovascular: Normal rate, regular rhythm and intact distal pulses.  Exam reveals no gallop and no  friction rub.   No murmur heard. Pulmonary/Chest: No respiratory distress. He has no wheezes. He has no rales.  Lymphadenopathy:    He has no cervical adenopathy.  Neurological: He is alert and oriented to person, place, and time.  Skin: Skin is warm and dry.   Assessment and Plan :   1. Viral upper respiratory infection 2. Sore throat 3. Cough - Advised supportive care. RTC or call 10/05/2015 for recheck if no improvement.   4. Seasonal allergies 5. Nasal congestion - Patient to start Zyrtec and take this consistently. Use 60mg  Sudafed for congestion. Counseled on allergy management. He agreed to do this.  Jaynee Eagles, PA-C [facility] [phone] 09/30/2015 9:11 AM

## 2015-09-30 NOTE — Patient Instructions (Addendum)
Upper Respiratory Infection, Adult Most upper respiratory infections (URIs) are a viral infection of the air passages leading to the lungs. A URI affects the nose, throat, and upper air passages. The most common type of URI is nasopharyngitis and is typically referred to as "the common cold." URIs run their course and usually go away on their own. Most of the time, a URI does not require medical attention, but sometimes a bacterial infection in the upper airways can follow a viral infection. This is called a secondary infection. Sinus and middle ear infections are common types of secondary upper respiratory infections. Bacterial pneumonia can also complicate a URI. A URI can worsen asthma and chronic obstructive pulmonary disease (COPD). Sometimes, these complications can require emergency medical care and may be life threatening.  CAUSES Almost all URIs are caused by viruses. A virus is a type of germ and can spread from one person to another.  RISKS FACTORS You may be at risk for a URI if:   You smoke.   You have chronic heart or lung disease.  You have a weakened defense (immune) system.   You are very young or very old.   You have nasal allergies or asthma.  You work in crowded or poorly ventilated areas.  You work in health care facilities or schools. SIGNS AND SYMPTOMS  Symptoms typically develop 2-3 days after you come in contact with a cold virus. Most viral URIs last 7-10 days. However, viral URIs from the influenza virus (flu virus) can last 14-18 days and are typically more severe. Symptoms may include:   Runny or stuffy (congested) nose.   Sneezing.   Cough.   Sore throat.   Headache.   Fatigue.   Fever.   Loss of appetite.   Pain in your forehead, behind your eyes, and over your cheekbones (sinus pain).  Muscle aches.  DIAGNOSIS  Your health care provider may diagnose a URI by:  Physical exam.  Tests to check that your symptoms are not due to  another condition such as:  Strep throat.  Sinusitis.  Pneumonia.  Asthma. TREATMENT  A URI goes away on its own with time. It cannot be cured with medicines, but medicines may be prescribed or recommended to relieve symptoms. Medicines may help:  Reduce your fever.  Reduce your cough.  Relieve nasal congestion. HOME CARE INSTRUCTIONS   Take medicines only as directed by your health care provider.   Gargle warm saltwater or take cough drops to comfort your throat as directed by your health care provider.  Use a warm mist humidifier or inhale steam from a shower to increase air moisture. This may make it easier to breathe.  Drink enough fluid to keep your urine clear or pale yellow.   Eat soups and other clear broths and maintain good nutrition.   Rest as needed.   Return to work when your temperature has returned to normal or as your health care provider advises. You may need to stay home longer to avoid infecting others. You can also use a face mask and careful hand washing to prevent spread of the virus.  Increase the usage of your inhaler if you have asthma.   Do not use any tobacco products, including cigarettes, chewing tobacco, or electronic cigarettes. If you need help quitting, ask your health care provider. PREVENTION  The best way to protect yourself from getting a cold is to practice good hygiene.   Avoid oral or hand contact with people with cold   symptoms.   Wash your hands often if contact occurs.  There is no clear evidence that vitamin C, vitamin E, echinacea, or exercise reduces the chance of developing a cold. However, it is always recommended to get plenty of rest, exercise, and practice good nutrition.  SEEK MEDICAL CARE IF:   You are getting worse rather than better.   Your symptoms are not controlled by medicine.   You have chills.  You have worsening shortness of breath.  You have brown or red mucus.  You have yellow or brown nasal  discharge.  You have pain in your face, especially when you bend forward.  You have a fever.  You have swollen neck glands.  You have pain while swallowing.  You have white areas in the back of your throat. SEEK IMMEDIATE MEDICAL CARE IF:   You have severe or persistent:  Headache.  Ear pain.  Sinus pain.  Chest pain.  You have chronic lung disease and any of the following:  Wheezing.  Prolonged cough.  Coughing up blood.  A change in your usual mucus.  You have a stiff neck.  You have changes in your:  Vision.  Hearing.  Thinking.  Mood. MAKE SURE YOU:   Understand these instructions.  Will watch your condition.  Will get help right away if you are not doing well or get worse.   This information is not intended to replace advice given to you by your health care provider. Make sure you discuss any questions you have with your health care provider.   Document Released: 08/31/2000 Document Revised: 07/22/2014 Document Reviewed: 06/12/2013 Elsevier Interactive Patient Education 2016 [place] An allergy is an abnormal reaction to a substance by the body's defense system (immune system). Allergies can develop at any age. WHAT CAUSES ALLERGIES? An allergic reaction happens when the immune system mistakenly reacts to a normally harmless substance, called an allergen, as if it were harmful. The immune system releases antibodies to fight the substance. Antibodies eventually release a chemical called histamine into the bloodstream. The release of histamine is meant to protect the body from infection, but it also causes discomfort. An allergic reaction can be triggered by:  Eating an allergen.  Inhaling an allergen.  Touching an allergen. WHAT TYPES OF ALLERGIES ARE THERE? There are many types of allergies. Common types include:  Seasonal allergies. People with this type of allergy are usually allergic to substances that are only  present during certain seasons, such as molds and pollens.  Food allergies.  Drug allergies.  Insect allergies.  Animal dander allergies. WHAT ARE SYMPTOMS OF ALLERGIES? Possible allergy symptoms include:  Swelling of the lips, face, tongue, mouth, or throat.  Sneezing, coughing, or wheezing.  Nasal congestion.  Tingling in the mouth.  Rash.  Itching.  Itchy, red, swollen areas of skin (hives).  Watery eyes.  Vomiting.  Diarrhea.  Dizziness.  Lightheadedness.  Fainting.  Trouble breathing or swallowing.  Chest tightness.  Rapid heartbeat. HOW ARE ALLERGIES DIAGNOSED? Allergies are diagnosed with a medical and family history and one or more of the following:  Skin tests.  Blood tests.  A food diary. A food diary is a record of all the foods and drinks you have in a day and of all the symptoms you experience.  The results of an elimination diet. An elimination diet involves eliminating foods from your diet and then adding them back in one by one to find out if a certain  food causes an allergic reaction. HOW ARE ALLERGIES TREATED? There is no cure for allergies, but allergic reactions can be treated with medicine. Severe reactions usually need to be treated at a hospital. HOW CAN REACTIONS BE PREVENTED? The best way to prevent an allergic reaction is by avoiding the substance you are allergic to. Allergy shots and medicines can also help prevent reactions in some cases. People with severe allergic reactions may be able to prevent a life-threatening reaction called anaphylaxis with a medicine given right after exposure to the allergen.   This information is not intended to replace advice given to you by your health care provider. Make sure you discuss any questions you have with your health care provider.   Document Released: 05/31/2002 Document Revised: 03/28/2014 Document Reviewed: 12/17/2013 Elsevier Interactive Patient Education 2016 [employer].     IF you received an x-ray today, you will receive an invoice from [facility]. Please contact [facility] at [phone] with questions or concerns regarding your invoice.   IF you received labwork today, you will receive an invoice from [employer]. Please contact [facility] at [phone] with questions or concerns regarding your invoice.   Our billing staff will not be able to assist you with questions regarding bills from these companies.  You will be contacted with the lab results as soon as they are available. The fastest way to get your results is to activate your My Chart account. Instructions are located on the last page of this paperwork. If you have not heard from [place] regarding the results in 2 weeks, please contact this office.

## 2015-11-01 ENCOUNTER — Other Ambulatory Visit: Payer: Self-pay | Admitting: Urgent Care

## 2015-11-01 DIAGNOSIS — R059 Cough, unspecified: Secondary | ICD-10-CM

## 2015-11-01 DIAGNOSIS — J029 Acute pharyngitis, unspecified: Secondary | ICD-10-CM

## 2015-11-01 DIAGNOSIS — R05 Cough: Secondary | ICD-10-CM

## 2015-11-06 DIAGNOSIS — R972 Elevated prostate specific antigen [PSA]: Secondary | ICD-10-CM | POA: Diagnosis not present

## 2015-11-13 DIAGNOSIS — R972 Elevated prostate specific antigen [PSA]: Secondary | ICD-10-CM | POA: Diagnosis not present

## 2015-12-03 ENCOUNTER — Other Ambulatory Visit: Payer: Self-pay | Admitting: Internal Medicine

## 2015-12-11 ENCOUNTER — Encounter: Payer: Self-pay | Admitting: Internal Medicine

## 2015-12-11 ENCOUNTER — Ambulatory Visit (INDEPENDENT_AMBULATORY_CARE_PROVIDER_SITE_OTHER): Payer: Federal, State, Local not specified - PPO | Admitting: Internal Medicine

## 2015-12-11 ENCOUNTER — Other Ambulatory Visit (INDEPENDENT_AMBULATORY_CARE_PROVIDER_SITE_OTHER): Payer: Federal, State, Local not specified - PPO

## 2015-12-11 VITALS — BP 106/62 | HR 96 | Temp 98.2°F | Resp 16 | Ht 64.0 in | Wt 231.0 lb

## 2015-12-11 DIAGNOSIS — I10 Essential (primary) hypertension: Secondary | ICD-10-CM | POA: Diagnosis not present

## 2015-12-11 DIAGNOSIS — Z23 Encounter for immunization: Secondary | ICD-10-CM | POA: Diagnosis not present

## 2015-12-11 DIAGNOSIS — E119 Type 2 diabetes mellitus without complications: Secondary | ICD-10-CM

## 2015-12-11 DIAGNOSIS — R609 Edema, unspecified: Secondary | ICD-10-CM

## 2015-12-11 DIAGNOSIS — R972 Elevated prostate specific antigen [PSA]: Secondary | ICD-10-CM

## 2015-12-11 DIAGNOSIS — E669 Obesity, unspecified: Secondary | ICD-10-CM

## 2015-12-11 LAB — COMPREHENSIVE METABOLIC PANEL
ALK PHOS: 47 U/L (ref 39–117)
ALT: 14 U/L (ref 0–53)
AST: 13 U/L (ref 0–37)
Albumin: 3.8 g/dL (ref 3.5–5.2)
BUN: 16 mg/dL (ref 6–23)
CO2: 31 mEq/L (ref 19–32)
Calcium: 9 mg/dL (ref 8.4–10.5)
Chloride: 104 mEq/L (ref 96–112)
Creatinine, Ser: 1.08 mg/dL (ref 0.40–1.50)
GFR: 73.66 mL/min (ref >60.00)
GLUCOSE: 129 mg/dL — AB (ref 70–99)
POTASSIUM: 4.3 meq/L (ref 3.5–5.1)
Sodium: 141 mEq/L (ref 135–145)
TOTAL PROTEIN: 6.7 g/dL (ref 6.0–8.3)
Total Bilirubin: 0.8 mg/dL (ref 0.2–1.2)

## 2015-12-11 LAB — HEMOGLOBIN A1C: HEMOGLOBIN A1C: 6.1 % (ref 4.6–6.5)

## 2015-12-11 LAB — MICROALBUMIN / CREATININE URINE RATIO
CREATININE, U: 135.4 mg/dL
MICROALB/CREAT RATIO: 0.5 mg/g (ref 0.0–30.0)
Microalb, Ur: <0.7 mg/dL (ref 0.0–1.9)

## 2015-12-11 MED ORDER — FUROSEMIDE 20 MG PO TABS: 20.0000 mg | ORAL_TABLET | Freq: Every day | ORAL | 1 refills | Status: DC

## 2015-12-11 NOTE — Assessment & Plan Note (Signed)
BP well controlled Current regimen effective and well tolerated Continue current medications at current doses cmp  

## 2015-12-11 NOTE — Progress Notes (Signed)
Subjective:    Patient ID: Alejandro Brown, male    DOB: July 07, 1955, 60 y.o.   MRN: [id number]  HPI The patient is here for follow up.  Diabetes: He is controlling the sugars with diet. He is compliant with a diabetic diet. He is active, but not exercising regularly. He checks his feet daily and denies foot lesions. He did have a blister on his left last toe, but it has healed.  It is from his work shoes.  He is up-to-date with an ophthalmology examination.   Hypertension: He is taking his medication daily. He is compliant with a low sodium diet.  He denies chest pain, palpitations, shortness of breath and regular headaches. He is active at work, but not exercising regularly.      Leg edema, chronic in nature:  He is taking the lasix daly.  He feels his edema is well controlled.  He wears a compression sock on his left leg.    Medications and allergies reviewed with patient and updated if appropriate.  Patient Active Problem List   Diagnosis Date Noted  . Enlarged prostate 06/12/2015  . Elevated PSA 06/12/2015  . Ventral hernia 06/12/2015  . HTN (hypertension) 08/31/2010  . Diabetes type 2, controlled ([place]) 08/31/2010  . Obesity 08/31/2010  . Mental retardation 08/31/2010  . Dependent edema 08/31/2010  . Obstructive sleep apnea 05/05/2007  . ALLERGIC RHINITIS 05/04/2007    Current Outpatient Prescriptions on File Prior to Visit  Medication Sig Dispense Refill  . aspirin EC 81 MG tablet Take 1 tablet (81 mg total) by mouth daily. 150 tablet 2  . furosemide (LASIX) 20 MG tablet take 1 tablet by mouth once daily 90 tablet 1  . multivitamin (ONE-A-DAY MEN'S) TABS tablet Take 1 tablet by mouth daily.  0  . ramipril (ALTACE) 5 MG capsule take 1 capsule by mouth once daily 90 capsule 1   No current facility-administered medications on file prior to visit.     Past Medical History:  Diagnosis Date  . ALLERGIC RHINITIS   . Dependent edema    chronic L>R  . GERD  (gastroesophageal reflux disease)   . Hypertension   . Mental retardation    mild  . Obesity   . OBSTRUCTIVE SLEEP APNEA    noncompliant with CPAP  . Type II or unspecified type diabetes mellitus without mention of complication, not stated as uncontrolled    diet controlled    Past Surgical History:  Procedure Laterality Date  . NASAL SEPTUM SURGERY    . ROTATOR CUFF REPAIR Left 06/2014   [name]    Social History   Social History  . Marital status: Single    Spouse name: n/a  . Number of children: 0  . Years of education: 12th grade   Occupational History  . [employer]     Custodian/maintenance   Social History Main Topics  . Smoking status: Never Smoker  . Smokeless tobacco: Never Used  . Alcohol use No  . Drug use: No  . Sexual activity: Not on file   Other Topics Concern  . Not on file   Social History Narrative   Theme park manager.   Lives with a relative who cooks and keeps up the home.    Family History  Problem Relation Age of Onset  . Breast cancer Sister   . Breast cancer Other   . Colon cancer Neg Hx     Review of Systems  Constitutional: Negative for fever.  Respiratory: Negative for cough, shortness of breath and wheezing.   Cardiovascular: Positive for leg swelling. Negative for chest pain.  Neurological: Negative for dizziness, light-headedness and headaches.       Objective:   Vitals:   12/11/15 0943  BP: 106/62  Pulse: 96  Resp: 16  Temp: 98.2 F (36.8 C)   Filed Weights   12/11/15 0943  Weight: 231 lb (104.8 kg)   Body mass index is 39.65 kg/m.   Physical Exam    Constitutional: Appears well-developed and well-nourished. No distress.  HENT:  Head: Normocephalic and atraumatic.  Neck: Neck supple. No tracheal deviation present. No thyromegaly present.  Cardiovascular: Normal rate, regular rhythm and normal heart sounds.   No murmur heard. No carotid bruit  Pulmonary/Chest: Effort normal and breath sounds normal.  No respiratory distress. No has no wheezes. No rales.  Musculoskeletal: No edema.  Lymphadenopathy: No cervical adenopathy.  Skin: Skin is warm and dry. Not diaphoretic.  Psychiatric: Normal mood and affect. Behavior is normal.     Assessment & Plan:   Flu vaccine   See Problem List for Assessment and Plan of chronic medical problems.   F/u in 6 months

## 2015-12-11 NOTE — Assessment & Plan Note (Signed)
Controlled with lasix daily and left leg compression sock Continue lasix 20 mg daily cmp

## 2015-12-11 NOTE — Progress Notes (Signed)
Pre visit review using our clinic review tool, if applicable. No additional management support is needed unless otherwise documented below in the visit note. 

## 2015-12-11 NOTE — Assessment & Plan Note (Signed)
Saw urology  - had round of antibiotics psa returned to normal Will urology in one year

## 2015-12-11 NOTE — Patient Instructions (Addendum)
  Test(s) ordered today. Your results will be released to [facility] (or called to you) after review, usually within 72hours after test completion. If any changes need to be made, you will be notified at that same time.  All other Health Maintenance issues reviewed.   All recommended immunizations and age-appropriate screenings are up-to-date or discussed.  Flu vaccine administered today.   Medications reviewed and updated.  No changes recommended at this time.  Your prescription(s) have been submitted to your pharmacy. Please take as directed and contact our office if you believe you are having problem(s) with the medication(s).   Please followup in 6 months for a physical

## 2015-12-11 NOTE — Assessment & Plan Note (Signed)
Controlled Check a1c, micro today Encouraged weight loss - dec portions and increase activity outside of work

## 2015-12-11 NOTE — Assessment & Plan Note (Signed)
Advised decreased portions and weight loss encouraged exercise outside of work

## 2015-12-12 ENCOUNTER — Encounter: Payer: Self-pay | Admitting: Internal Medicine

## 2016-01-21 ENCOUNTER — Telehealth: Payer: Self-pay | Admitting: Internal Medicine

## 2016-01-21 DIAGNOSIS — R609 Edema, unspecified: Secondary | ICD-10-CM

## 2016-01-21 NOTE — Telephone Encounter (Signed)
Patient is requesting script for compression stockings to be sent to [facility].

## 2016-01-25 NOTE — Telephone Encounter (Signed)
Please advise 

## 2016-01-26 MED ORDER — MEDICAL COMPRESSION SOCKS MISC: 0 refills | Status: DC

## 2016-01-26 NOTE — Telephone Encounter (Signed)
printed

## 2016-01-26 NOTE — Telephone Encounter (Signed)
Faxed RX to [facility]

## 2016-03-18 DIAGNOSIS — F319 Bipolar disorder, unspecified: Secondary | ICD-10-CM | POA: Diagnosis not present

## 2016-03-25 DIAGNOSIS — F319 Bipolar disorder, unspecified: Secondary | ICD-10-CM | POA: Diagnosis not present

## 2016-03-31 DIAGNOSIS — F319 Bipolar disorder, unspecified: Secondary | ICD-10-CM | POA: Diagnosis not present

## 2016-04-08 DIAGNOSIS — F319 Bipolar disorder, unspecified: Secondary | ICD-10-CM | POA: Diagnosis not present

## 2016-04-13 DIAGNOSIS — F319 Bipolar disorder, unspecified: Secondary | ICD-10-CM | POA: Diagnosis not present

## 2016-04-20 DIAGNOSIS — M1990 Unspecified osteoarthritis, unspecified site: Secondary | ICD-10-CM | POA: Diagnosis not present

## 2016-04-21 DIAGNOSIS — F319 Bipolar disorder, unspecified: Secondary | ICD-10-CM | POA: Diagnosis not present

## 2016-04-26 DIAGNOSIS — F319 Bipolar disorder, unspecified: Secondary | ICD-10-CM | POA: Diagnosis not present

## 2016-04-29 DIAGNOSIS — J029 Acute pharyngitis, unspecified: Secondary | ICD-10-CM | POA: Diagnosis not present

## 2016-04-29 DIAGNOSIS — R591 Generalized enlarged lymph nodes: Secondary | ICD-10-CM | POA: Diagnosis not present

## 2016-05-05 ENCOUNTER — Ambulatory Visit (INDEPENDENT_AMBULATORY_CARE_PROVIDER_SITE_OTHER): Payer: Federal, State, Local not specified - PPO

## 2016-05-05 ENCOUNTER — Ambulatory Visit (INDEPENDENT_AMBULATORY_CARE_PROVIDER_SITE_OTHER): Payer: Federal, State, Local not specified - PPO | Admitting: Family Medicine

## 2016-05-05 VITALS — BP 128/88 | HR 100 | Temp 98.4°F | Resp 20 | Ht 64.0 in | Wt 232.2 lb

## 2016-05-05 DIAGNOSIS — J209 Acute bronchitis, unspecified: Secondary | ICD-10-CM

## 2016-05-05 DIAGNOSIS — R05 Cough: Secondary | ICD-10-CM

## 2016-05-05 DIAGNOSIS — R062 Wheezing: Secondary | ICD-10-CM

## 2016-05-05 DIAGNOSIS — R058 Other specified cough: Secondary | ICD-10-CM

## 2016-05-05 MED ORDER — ALBUTEROL SULFATE (2.5 MG/3ML) 0.083% IN NEBU
2.5000 mg | INHALATION_SOLUTION | Freq: Once | RESPIRATORY_TRACT | Status: AC
  Administered 2016-05-05: 2.5 mg via RESPIRATORY_TRACT

## 2016-05-05 MED ORDER — ALBUTEROL SULFATE HFA 108 (90 BASE) MCG/ACT IN AERS: 2.0000 | INHALATION_SPRAY | Freq: Four times a day (QID) | RESPIRATORY_TRACT | 0 refills | Status: DC | PRN

## 2016-05-05 MED ORDER — IPRATROPIUM BROMIDE 0.02 % IN SOLN
0.5000 mg | Freq: Once | RESPIRATORY_TRACT | Status: AC
  Administered 2016-05-05: 0.5 mg via RESPIRATORY_TRACT

## 2016-05-05 MED ORDER — PREDNISONE 50 MG PO TABS: ORAL_TABLET | ORAL | 0 refills | Status: DC

## 2016-05-05 MED ORDER — GUAIFENESIN-CODEINE 100-10 MG/5ML PO SOLN: 5.0000 mL | Freq: Three times a day (TID) | ORAL | 0 refills | Status: DC | PRN

## 2016-05-05 NOTE — Patient Instructions (Addendum)
  You do have bronchitis which was triggered by the recent upper respiratory infection that you had.  To treat this we are going to do accommodation of things. Continue taking the Augmentin that you're taking.  Take the prednisone one pill a day for the next 5 days.  Use the albuterol inhaler every 4-6 hours and at nighttime before bed through the weekend. After that you only have to use it if you need it for wheezing or cough.  I have also prescribed to a codeine cough syrup to help.  Take this at night.   IF you received an x-ray today, you will receive an invoice from [facility]. Please contact [facility] at [phone] with questions or concerns regarding your invoice.   IF you received labwork today, you will receive an invoice from [place]. Please contact [facility] at [phone] with questions or concerns regarding your invoice.   Our billing staff will not be able to assist you with questions regarding bills from these companies.  You will be contacted with the lab results as soon as they are available. The fastest way to get your results is to activate your My Chart account. Instructions are located on the last page of this paperwork. If you have not heard from [place] regarding the results in 2 weeks, please contact this office.

## 2016-05-05 NOTE — Progress Notes (Signed)
Alejandro Brown is a 61 y.o. male who presents to [facility] today for persistent cough:  1.  Cough:  Patient with prolonged cough for past 2 weeks.  Sick contact is coworker diagnosed with flu roughly 2 weeks ago.  States that cough is daily, worse at night.  Cough alternates between dry hacking and productive of thick sputum. He has never been diagnosed with asthma or COPD. He is a never smoker.  He presented to an outside urgent care February 9 last week. He was diagnosed with strep pharyngitis despite a negative rapid strep and prescribed Augmentin. He brings the paperwork from this appointment here today. He states that he has had no improvement in his symptoms despite taking the Augmentin.  He denies any chest pain. Palpitations. No dyspnea. No fevers or chills.  ROS as above.    PMH reviewed. Patient is a nonsmoker.   Past Medical History:  Diagnosis Date  . ALLERGIC RHINITIS   . Dependent edema    chronic L>R  . GERD (gastroesophageal reflux disease)   . Hypertension   . Mental retardation    mild  . Obesity   . OBSTRUCTIVE SLEEP APNEA    noncompliant with CPAP  . Type II or unspecified type diabetes mellitus without mention of complication, not stated as uncontrolled    diet controlled   Past Surgical History:  Procedure Laterality Date  . NASAL SEPTUM SURGERY    . ROTATOR CUFF REPAIR Left 06/2014   [name]    Medications reviewed. Current Outpatient Prescriptions  Medication Sig Dispense Refill  . aspirin EC 81 MG tablet Take 1 tablet (81 mg total) by mouth daily. 150 tablet 2  . Elastic Bandages & Supports (MEDICAL COMPRESSION SOCKS) MISC Medium compression 20-30 mm Hg for chronic leg edema 4 each 0  . furosemide (LASIX) 20 MG tablet Take 1 tablet (20 mg total) by mouth daily. 90 tablet 1  . multivitamin (ONE-A-DAY MEN'S) TABS tablet Take 1 tablet by mouth daily.  0  . ramipril (ALTACE) 5 MG capsule take 1 capsule by mouth once daily 90 capsule 1  .  tamsulosin (FLOMAX) 0.4 MG CAPS capsule Take 0.4 mg by mouth daily.  0   No current facility-administered medications for this visit.      Physical Exam:  BP 128/88   Pulse 100   Temp 98.4 F (36.9 C) (Oral)   Resp 20   Ht 5\' 4"  (1.626 m)   Wt 232 lb 3.2 oz (105.3 kg)   SpO2 96%   BMI 39.86 kg/m  Gen:  Patient sitting on exam table, appears stated age in no acute distress Head: Normocephalic atraumatic Eyes: EOMI, PERRL, sclera and conjunctiva non-erythematous Ears:  Canals clear bilaterally.  TMs pearly gray bilaterally without erythema or bulging.   Nose:  Nasal turbinates non-enlarged  Mouth: Mucosa membranes moist. Difficulty visualizing posterior oropharynx secondary to body habitus Neck: No cervical lymphadenopathy noted Heart:  RRR, no murmurs auscultated. Pulm:  Poor air movement BL lung bases.  Wheezing mid-lungs.     Assessment and Plan:  1.  Post-viral bronchitis: - treated here with Duoneb.  Pronounced coughing fits during nebulizer administration.  - CXR negative for any PNA - Triggered by flu, or other viral illness.  This has resolved.  - Lung exam s/p Duoneb -- MUCH improved.  Still with some wheezing at bases.  However, much better air movement.  - plan to treat with combination of prednisone and albuterol inhaler. See  instructions. Also prescription for guaifenesin codeine cough syrup for symptomatic relief.  - FU if no relief in next week or so.  Sooner if worsening.

## 2016-05-06 DIAGNOSIS — F319 Bipolar disorder, unspecified: Secondary | ICD-10-CM | POA: Diagnosis not present

## 2016-05-13 DIAGNOSIS — F319 Bipolar disorder, unspecified: Secondary | ICD-10-CM | POA: Diagnosis not present

## 2016-05-19 DIAGNOSIS — F319 Bipolar disorder, unspecified: Secondary | ICD-10-CM | POA: Diagnosis not present

## 2016-05-25 DIAGNOSIS — F319 Bipolar disorder, unspecified: Secondary | ICD-10-CM | POA: Diagnosis not present

## 2016-05-27 ENCOUNTER — Other Ambulatory Visit: Payer: Self-pay | Admitting: Internal Medicine

## 2016-06-03 DIAGNOSIS — F319 Bipolar disorder, unspecified: Secondary | ICD-10-CM | POA: Diagnosis not present

## 2016-06-09 DIAGNOSIS — F319 Bipolar disorder, unspecified: Secondary | ICD-10-CM | POA: Diagnosis not present

## 2016-06-09 NOTE — Progress Notes (Addendum)
Subjective:    Patient ID: Alejandro Brown, male    DOB: 03-Mar-1956, 61 y.o.   MRN: [id number]  HPI He is here for a physical exam.   He is not exercising regularly.  He is not always compliant with a low fat/chol.   He has tried his friends viagra and would like to have a prescription of it.    He has not other concerns.  He went to the urologist for elevated psa and was started on flomax.  His psa has lowered.   Medications and allergies reviewed with patient and updated if appropriate.  Patient Active Problem List   Diagnosis Date Noted  . Enlarged prostate 06/12/2015  . Elevated PSA 06/12/2015  . Ventral hernia 06/12/2015  . HTN (hypertension) 08/31/2010  . Diabetes type 2, controlled ([place]) 08/31/2010  . Obesity 08/31/2010  . Mental retardation 08/31/2010  . Dependent edema 08/31/2010  . Obstructive sleep apnea 05/05/2007  . ALLERGIC RHINITIS 05/04/2007    Current Outpatient Prescriptions on File Prior to Visit  Medication Sig Dispense Refill  . aspirin EC 81 MG tablet Take 1 tablet (81 mg total) by mouth daily. 150 tablet 2  . Elastic Bandages & Supports (MEDICAL COMPRESSION SOCKS) MISC Medium compression 20-30 mm Hg for chronic leg edema 4 each 0  . furosemide (LASIX) 20 MG tablet Take 1 tablet (20 mg total) by mouth daily. 90 tablet 1  . multivitamin (ONE-A-DAY MEN'S) TABS tablet Take 1 tablet by mouth daily.  0  . ramipril (ALTACE) 5 MG capsule take 1 capsule by mouth once daily 90 capsule 0  . tamsulosin (FLOMAX) 0.4 MG CAPS capsule Take 0.4 mg by mouth daily.  0   No current facility-administered medications on file prior to visit.     Past Medical History:  Diagnosis Date  . ALLERGIC RHINITIS   . Dependent edema    chronic L>R  . GERD (gastroesophageal reflux disease)   . Hypertension   . Mental retardation    mild  . Obesity   . OBSTRUCTIVE SLEEP APNEA    noncompliant with CPAP  . Type II or unspecified type diabetes mellitus without mention of  complication, not stated as uncontrolled    diet controlled    Past Surgical History:  Procedure Laterality Date  . NASAL SEPTUM SURGERY    . ROTATOR CUFF REPAIR Left 06/2014   [name]    Social History   Social History  . Marital status: Single    Spouse name: n/a  . Number of children: 0  . Years of education: 12th grade   Occupational History  . [employer]     Custodian/maintenance   Social History Main Topics  . Smoking status: Never Smoker  . Smokeless tobacco: Never Used  . Alcohol use No  . Drug use: No  . Sexual activity: Not Asked   Other Topics Concern  . 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    Family History  Problem Relation Age of Onset  . Breast cancer Sister   . Breast cancer Other   . Colon cancer Neg Hx     Review of Systems  Constitutional: Negative for appetite change, chills and fever.  Eyes: Negative for visual disturbance.  Respiratory: Negative for cough, shortness of breath and wheezing.   Cardiovascular: Positive for leg swelling. Negative for chest pain and palpitations.  Gastrointestinal: Negative for abdominal  pain, blood in stool, constipation, diarrhea and nausea.  Genitourinary: Negative for difficulty urinating, dysuria and hematuria.  Musculoskeletal: Negative for arthralgias and back pain.  Skin: Negative for color change and rash.  Neurological: Negative for light-headedness and headaches.  Psychiatric/Behavioral: Negative for dysphoric mood. The patient is not nervous/anxious.         Objective:   Vitals:   06/10/16 1024  BP: 116/72  Pulse: 95  Resp: 16  Temp: 98.2 F (36.8 C)   Filed Weights   06/10/16 1024  Weight: 241 lb (109.3 kg)   Body mass index is 41.37 kg/m.  Wt Readings from Last 3 Encounters:  06/10/16 241 lb (109.3 kg)  05/05/16 232 lb 3.2 oz (105.3 kg)  12/11/15 231 lb (104.8 kg)     Physical  Exam Constitutional: He appears well-developed and well-nourished. No distress.  HENT:  Head: Normocephalic and atraumatic.  Right Ear: External ear normal.  Left Ear: External ear normal.  Mouth/Throat: Oropharynx is clear and moist.  Normal ear canals and TM b/l  Eyes: Conjunctivae and EOM are normal.  Neck: Neck supple. No tracheal deviation present. No thyromegaly present.  No carotid bruit  Cardiovascular: Normal rate, regular rhythm, normal heart sounds and intact distal pulses.  1+ b/l edema No murmur heard. Pulmonary/Chest: Effort normal and breath sounds normal. No respiratory distress. He has no wheezes. He has no rales.  Abdominal: Soft. Umbilical and ventral hernias . He exhibits no distension. There is no tenderness.  Genitourinary: deferred to urology Lymphadenopathy:   He has no cervical adenopathy.  Skin: Skin is warm and dry. He is not diaphoretic.  Psychiatric: He has a normal mood and affect. His behavior is normal.         Assessment & Plan:   Physical exam: Screening blood work  ordered Immunizations  Not due for a pneumonia vaccine - Up to date  Colonoscopy   Up to date  Eye exams  Up to date  EKG  Last 2011 - will do one today Exercise - none, stressed regular exercise Weight - morbidly obese - discussed weight loss at length Skin  - no concerns Substance abuse  - none  See Problem List for Assessment and Plan of chronic medical problems.   FU in 6 months

## 2016-06-10 ENCOUNTER — Ambulatory Visit (INDEPENDENT_AMBULATORY_CARE_PROVIDER_SITE_OTHER): Payer: Federal, State, Local not specified - PPO | Admitting: Internal Medicine

## 2016-06-10 ENCOUNTER — Encounter: Payer: Self-pay | Admitting: Internal Medicine

## 2016-06-10 ENCOUNTER — Other Ambulatory Visit (INDEPENDENT_AMBULATORY_CARE_PROVIDER_SITE_OTHER): Payer: Federal, State, Local not specified - PPO

## 2016-06-10 VITALS — BP 116/72 | HR 95 | Temp 98.2°F | Resp 16 | Wt 241.0 lb

## 2016-06-10 DIAGNOSIS — E119 Type 2 diabetes mellitus without complications: Secondary | ICD-10-CM

## 2016-06-10 DIAGNOSIS — K429 Umbilical hernia without obstruction or gangrene: Secondary | ICD-10-CM | POA: Insufficient documentation

## 2016-06-10 DIAGNOSIS — Z0000 Encounter for general adult medical examination without abnormal findings: Secondary | ICD-10-CM | POA: Diagnosis not present

## 2016-06-10 DIAGNOSIS — I10 Essential (primary) hypertension: Secondary | ICD-10-CM

## 2016-06-10 DIAGNOSIS — Z6841 Body Mass Index (BMI) 40.0 and over, adult: Secondary | ICD-10-CM

## 2016-06-10 DIAGNOSIS — R972 Elevated prostate specific antigen [PSA]: Secondary | ICD-10-CM | POA: Diagnosis not present

## 2016-06-10 DIAGNOSIS — E6609 Other obesity due to excess calories: Secondary | ICD-10-CM

## 2016-06-10 DIAGNOSIS — N400 Enlarged prostate without lower urinary tract symptoms: Secondary | ICD-10-CM | POA: Diagnosis not present

## 2016-06-10 DIAGNOSIS — IMO0001 Reserved for inherently not codable concepts without codable children: Secondary | ICD-10-CM

## 2016-06-10 DIAGNOSIS — G4733 Obstructive sleep apnea (adult) (pediatric): Secondary | ICD-10-CM

## 2016-06-10 LAB — LIPID PANEL
CHOL/HDL RATIO: 4
CHOLESTEROL: 148 mg/dL (ref 0–200)
HDL: 39.5 mg/dL (ref >39.00)
LDL CALC: 86 mg/dL (ref 0–99)
NonHDL: 108.86
Triglycerides: 114 mg/dL (ref 0.0–149.0)
VLDL: 22.8 mg/dL (ref 0.0–40.0)

## 2016-06-10 LAB — TSH: TSH: 1.24 u[IU]/mL (ref 0.35–4.50)

## 2016-06-10 LAB — COMPREHENSIVE METABOLIC PANEL
ALT: 17 U/L (ref 0–53)
AST: 16 U/L (ref 0–37)
Albumin: 3.7 g/dL (ref 3.5–5.2)
Alkaline Phosphatase: 48 U/L (ref 39–117)
BILIRUBIN TOTAL: 0.5 mg/dL (ref 0.2–1.2)
BUN: 13 mg/dL (ref 6–23)
CALCIUM: 9.2 mg/dL (ref 8.4–10.5)
CHLORIDE: 106 meq/L (ref 96–112)
CO2: 29 meq/L (ref 19–32)
CREATININE: 0.98 mg/dL (ref 0.40–1.50)
GFR: 82.27 mL/min (ref >60.00)
GLUCOSE: 110 mg/dL — AB (ref 70–99)
Potassium: 4.5 mEq/L (ref 3.5–5.1)
SODIUM: 141 meq/L (ref 135–145)
Total Protein: 6.3 g/dL (ref 6.0–8.3)

## 2016-06-10 LAB — CBC WITH DIFFERENTIAL/PLATELET
BASOS ABS: 0.1 10*3/uL (ref 0.0–0.1)
Basophils Relative: 0.7 % (ref 0.0–3.0)
EOS ABS: 0.3 10*3/uL (ref 0.0–0.7)
Eosinophils Relative: 3.5 % (ref 0.0–5.0)
HEMATOCRIT: 42.9 % (ref 39.0–52.0)
Hemoglobin: 14.3 g/dL (ref 13.0–17.0)
LYMPHS PCT: 22.6 % (ref 12.0–46.0)
Lymphs Abs: 1.9 10*3/uL (ref 0.7–4.0)
MCHC: 33.3 g/dL (ref 30.0–36.0)
MCV: 90.1 fl (ref 78.0–100.0)
Monocytes Absolute: 0.8 10*3/uL (ref 0.1–1.0)
Monocytes Relative: 9.6 % (ref 3.0–12.0)
NEUTROS ABS: 5.3 10*3/uL (ref 1.4–7.7)
NEUTROS PCT: 63.6 % (ref 43.0–77.0)
PLATELETS: 263 10*3/uL (ref 150.0–400.0)
RBC: 4.76 Mil/uL (ref 4.22–5.81)
RDW: 12.5 % (ref 11.5–15.5)
WBC: 8.3 10*3/uL (ref 4.0–10.5)

## 2016-06-10 LAB — HEMOGLOBIN A1C: Hgb A1c MFr Bld: 6.4 % (ref 4.6–6.5)

## 2016-06-10 MED ORDER — SILDENAFIL CITRATE 20 MG PO TABS: ORAL_TABLET | ORAL | 5 refills | Status: DC

## 2016-06-10 MED ORDER — FIBER 625 MG PO TABS: ORAL_TABLET | ORAL | 0 refills | Status: DC

## 2016-06-10 NOTE — Assessment & Plan Note (Signed)
Check a1c Low sugar / carb diet Stressed regular exercise, weight loss  

## 2016-06-10 NOTE — Progress Notes (Signed)
Pre visit review using our clinic review tool, if applicable. No additional management support is needed unless otherwise documented below in the visit note. 

## 2016-06-10 NOTE — Assessment & Plan Note (Signed)
symtpoms controlled on flomax Following with Dr Louis Meckel

## 2016-06-10 NOTE — Assessment & Plan Note (Signed)
Has been diagnosed by Dr Annamaria Boots in the past - did not tolerate cpap Willing to be retested Refer to pulmonary Discussed concerned of untreated OSA

## 2016-06-10 NOTE — Assessment & Plan Note (Signed)
Stressed decreased portions - no junk food, dec carbs Increase exercise

## 2016-06-10 NOTE — Assessment & Plan Note (Signed)
BP well controlled Current regimen effective and well tolerated Continue current medications at current doses  

## 2016-06-10 NOTE — Progress Notes (Unsigned)
Alejandro Brown,  Your blood counts, thyroid function, kidney function and liver tests are normal.  Your cholesterol is very good.  Your sugars are controlled, but have been high on average than last year.  Your a1c is 6.4%.    Let me know if you have any questions or concerns.   Dr. Billey Gosling.

## 2016-06-10 NOTE — Patient Instructions (Addendum)
Work on [employer] loss  Test(s) ordered today. Your results will be released to [facility] (or called to you) after review, usually within 72hours after test completion. If any changes need to be made, you will be notified at that same time.  All other Health Maintenance issues reviewed.   All recommended immunizations and age-appropriate screenings are up-to-date or discussed.  No immunizations administered today.   Medications reviewed and updated.  Changes include trial of generic Viagra.  Your prescription(s) have been submitted to your pharmacy. Please take as directed and contact our office if you believe you are having problem(s) with the medication(s).  A referral was ordered for pulmonary for sleep study evaluation  Please followup in 6 months    Health Maintenance, Male A healthy lifestyle and preventive care is important for your health and wellness. Ask your health care provider about what schedule of regular examinations is right for you. What should I know about weight and diet?  Eat a Healthy Diet  Eat plenty of vegetables, fruits, whole grains, low-fat dairy products, and lean protein.  Do not eat a lot of foods high in solid fats, added sugars, or salt. Maintain a Healthy Weight  Regular exercise can help you achieve or maintain a healthy weight. You should:  Do at least 150 minutes of exercise each week. The exercise should increase your heart rate and make you sweat (moderate-intensity exercise).  Do strength-training exercises at least twice a week. Watch Your Levels of Cholesterol and Blood Lipids  Have your blood tested for lipids and cholesterol every 5 years starting at 63 years of age. If you are at high risk for heart disease, you should start having your blood tested when you are 63 years old. You may need to have your cholesterol levels checked more often if:  Your lipid or cholesterol levels are high.  You are older than 63 years of age.  You are at high  risk for heart disease. What should I know about cancer screening? Many types of cancers can be detected early and may often be prevented. Lung Cancer  You should be screened every year for lung cancer if:  You are a current smoker who has smoked for at least 30 years.  You are a former smoker who has quit within the past 15 years.  Talk to your health care provider about your screening options, when you should start screening, and how often you should be screened. Colorectal Cancer  Routine colorectal cancer screening usually begins at 63 years of age and should be repeated every 5-10 years until you are 63 years old. You may need to be screened more often if early forms of precancerous polyps or small growths are found. Your health care provider may recommend screening at an earlier age if you have risk factors for colon cancer.  Your health care provider may recommend using home test kits to check for hidden blood in the stool.  A small camera at the end of a tube can be used to examine your colon (sigmoidoscopy or colonoscopy). This checks for the earliest forms of colorectal cancer. Prostate and Testicular Cancer  Depending on your age and overall health, your health care provider may do certain tests to screen for prostate and testicular cancer.  Talk to your health care provider about any symptoms or concerns you have about testicular or prostate cancer. Skin Cancer  Check your skin from head to toe regularly.  Tell your health care provider about any new  moles or changes in moles, especially if:  There is a change in a mole's size, shape, or color.  You have a mole that is larger than a pencil eraser.  Always use sunscreen. Apply sunscreen liberally and repeat throughout the day.  Protect yourself by wearing long sleeves, pants, a wide-brimmed hat, and sunglasses when outside. What should I know about heart disease, diabetes, and high blood pressure?  If you are 16-68  years of age, have your blood pressure checked every 3-5 years. If you are 21 years of age or older, have your blood pressure checked every year. You should have your blood pressure measured twice-once when you are at a hospital or clinic, and once when you are not at a hospital or clinic. Record the average of the two measurements. To check your blood pressure when you are not at a hospital or clinic, you can use:  An automated blood pressure machine at a pharmacy.  A home blood pressure monitor.  Talk to your health care provider about your target blood pressure.  If you are between 81-25 years old, ask your health care provider if you should take aspirin to prevent heart disease.  Have regular diabetes screenings by checking your fasting blood sugar level.  If you are at a normal weight and have a low risk for diabetes, have this test once every three years after the age of 78.  If you are overweight and have a high risk for diabetes, consider being tested at a younger age or more often.  A one-time screening for abdominal aortic aneurysm (AAA) by ultrasound is recommended for men aged 37-75 years who are current or former smokers. What should I know about preventing infection? Hepatitis B  If you have a higher risk for hepatitis B, you should be screened for this virus. Talk with your health care provider to find out if you are at risk for hepatitis B infection. Hepatitis C  Blood testing is recommended for:  Everyone born from [birth year] through 1967.  Anyone with known risk factors for hepatitis C. Sexually Transmitted Diseases (STDs)  You should be screened each year for STDs including gonorrhea and chlamydia if:  You are sexually active and are younger than 63 years of age.  You are older than 63 years of age and your health care provider tells you that you are at risk for this type of infection.  Your sexual activity has changed since you were last screened and you are at an  increased risk for chlamydia or gonorrhea. Ask your health care provider if you are at risk.  Talk with your health care provider about whether you are at high risk of being infected with HIV. Your health care provider may recommend a prescription medicine to help prevent HIV infection. What else can I do?  Schedule regular health, dental, and eye exams.  Stay current with your vaccines (immunizations).  Do not use any tobacco products, such as cigarettes, chewing tobacco, and e-cigarettes. If you need help quitting, ask your health care provider.  Limit alcohol intake to no more than 2 drinks per day. One drink equals 12 ounces of beer, 5 ounces of wine, or 1 ounces of hard liquor.  Do not use street drugs.  Do not share needles.  Ask your health care provider for help if you need support or information about quitting drugs.  Tell your health care provider if you often feel depressed.  Tell your health care provider if you have  ever been abused or do not feel safe at home. This information is not intended to replace advice given to you by your health care provider. Make sure you discuss any questions you have with your health care provider. Document Released: 09/03/2007 Document Revised: 11/04/2015 Document Reviewed: 12/09/2014 Elsevier Interactive Patient Education  2017 [employer].

## 2016-06-10 NOTE — Assessment & Plan Note (Signed)
psa improved Sees urology annually

## 2016-06-11 ENCOUNTER — Telehealth: Payer: Self-pay | Admitting: Emergency Medicine

## 2016-06-11 NOTE — Telephone Encounter (Signed)
Pa has been started. Key [id number]

## 2016-06-14 NOTE — Telephone Encounter (Signed)
PA was denied for sildenafil. Insurance will only cover from DX of elevated PSA, Enlarged prostate. Please advise if pt is taking or if appeal should be done.

## 2016-06-14 NOTE — Telephone Encounter (Signed)
We can appeal - he has elevated psa and enlarged prostate - has seen urology for that.

## 2016-06-16 DIAGNOSIS — F319 Bipolar disorder, unspecified: Secondary | ICD-10-CM | POA: Diagnosis not present

## 2016-06-22 ENCOUNTER — Encounter (HOSPITAL_COMMUNITY): Payer: Self-pay | Admitting: Cardiology

## 2016-06-22 ENCOUNTER — Ambulatory Visit (INDEPENDENT_AMBULATORY_CARE_PROVIDER_SITE_OTHER): Payer: Federal, State, Local not specified - PPO | Admitting: Emergency Medicine

## 2016-06-22 ENCOUNTER — Ambulatory Visit (INDEPENDENT_AMBULATORY_CARE_PROVIDER_SITE_OTHER): Payer: Federal, State, Local not specified - PPO

## 2016-06-22 ENCOUNTER — Ambulatory Visit (HOSPITAL_COMMUNITY)
Admission: RE | Admit: 2016-06-22 | Discharge: 2016-06-22 | Disposition: A | Discharge status: Home / Self Care | Payer: Federal, State, Local not specified - PPO | Source: Ambulatory Visit | Attending: Cardiology | Admitting: Cardiology

## 2016-06-22 VITALS — BP 134/80 | HR 96 | Temp 99.0°F | Resp 16 | Ht 64.0 in

## 2016-06-22 DIAGNOSIS — S76212A Strain of adductor muscle, fascia and tendon of left thigh, initial encounter: Secondary | ICD-10-CM

## 2016-06-22 DIAGNOSIS — R938 Abnormal findings on diagnostic imaging of other specified body structures: Secondary | ICD-10-CM | POA: Diagnosis not present

## 2016-06-22 DIAGNOSIS — M79605 Pain in left leg: Secondary | ICD-10-CM

## 2016-06-22 DIAGNOSIS — M25552 Pain in left hip: Secondary | ICD-10-CM | POA: Diagnosis not present

## 2016-06-22 LAB — POCT CBC
GRANULOCYTE PERCENT: 68.6 % (ref 37–80)
HEMATOCRIT: 47 % (ref 43.5–53.7)
Hemoglobin: 16.3 g/dL (ref 14.1–18.1)
Lymph, poc: 2.2 (ref 0.6–3.4)
MCH, POC: 31.2 pg (ref 27–31.2)
MCHC: 34.7 g/dL (ref 31.8–35.4)
MCV: 89.7 fL (ref 80–97)
MID (cbc): 0.4 (ref 0–0.9)
MPV: 8.7 fL (ref 0–99.8)
PLATELET COUNT, POC: 254 10*3/uL (ref 142–424)
POC GRANULOCYTE: 5.8 (ref 2–6.9)
POC LYMPH PERCENT: 26.7 %L (ref 10–50)
POC MID %: 4.7 %M (ref 0–12)
RBC: 5.23 M/uL (ref 4.69–6.13)
RDW, POC: 12.3 %
WBC: 8.4 10*3/uL (ref 4.6–10.2)

## 2016-06-22 MED ORDER — KETOROLAC TROMETHAMINE 60 MG/2ML IM SOLN
60.0000 mg | Freq: Once | INTRAMUSCULAR | Status: AC
  Administered 2016-06-22: 60 mg via INTRAMUSCULAR

## 2016-06-22 NOTE — Telephone Encounter (Signed)
Contacted [employer], pt will have to be the one to do an appeal for the denial. Informed the rep that pt has a hx of mental retardation and would be unable to do this. She stated that we can write a letter as to why the pt would need this medication and have the DR and PT sign. Tried contacting pt and unable to LVM

## 2016-06-22 NOTE — Progress Notes (Signed)
Alejandro Brown 61 y.o.   Chief Complaint  Patient presents with  . Leg Pain    hurting since Sunday night     HISTORY OF PRESENT ILLNESS: This is a 61 y.o. male complaining of left leg pain, mostly left hip/groin area, since last Sunday, 3 days ago. Thinks he may have pulled a muscle. No fever, n/v, trouble urinating or defecating. Hurts to stand on left leg or to walk.  HPI   Prior to Admission medications   Medication Sig Start Date End Date Taking? Authorizing Provider  aspirin EC 81 MG tablet Take 1 tablet (81 mg total) by mouth daily. 04/21/14  Yes Rowe Clack, MD  Calcium Polycarbophil (FIBER) 625 MG TABS Taking one daily 06/10/16  Yes Binnie Rail, MD  Elastic Bandages & Supports (MEDICAL COMPRESSION SOCKS) MISC Medium compression 20-30 mm Hg for chronic leg edema 01/26/16  Yes Binnie Rail, MD  furosemide (LASIX) 20 MG tablet Take 1 tablet (20 mg total) by mouth daily. 12/11/15  Yes Binnie Rail, MD  multivitamin (ONE-A-DAY MEN'S) TABS tablet Take 1 tablet by mouth daily. 04/21/14  Yes Rowe Clack, MD  ramipril (ALTACE) 5 MG capsule take 1 capsule by mouth once daily 05/27/16  Yes Binnie Rail, MD  sildenafil (REVATIO) 20 MG tablet Take 3-5 pills as needed 06/10/16  Yes Binnie Rail, MD  tamsulosin (FLOMAX) 0.4 MG CAPS capsule Take 0.4 mg by mouth daily. 11/13/15  Yes Historical Provider, MD    Allergies  Allergen Reactions  . Clarithromycin Nausea Only    Patient Active Problem List   Diagnosis Date Noted  . Umbilical hernia 08/67/6195  . Enlarged prostate 06/12/2015  . Elevated PSA 06/12/2015  . Ventral hernia 06/12/2015  . HTN (hypertension) 08/31/2010  . Diabetes type 2, controlled ([place]) 08/31/2010  . Obesity 08/31/2010  . Mental retardation 08/31/2010  . Dependent edema 08/31/2010  . Obstructive sleep apnea 05/05/2007  . ALLERGIC RHINITIS 05/04/2007    Past Medical History:  Diagnosis Date  . ALLERGIC RHINITIS   . Dependent edema    chronic  L>R  . GERD (gastroesophageal reflux disease)   . Hypertension   . Mental retardation    mild  . Obesity   . OBSTRUCTIVE SLEEP APNEA    noncompliant with CPAP  . Type II or unspecified type diabetes mellitus without mention of complication, not stated as uncontrolled    diet controlled    Past Surgical History:  Procedure Laterality Date  . NASAL SEPTUM SURGERY    . ROTATOR CUFF REPAIR Left 06/2014   [name]    Social History   Social History  . Marital status: Single    Spouse name: n/a  . Number of children: 0  . Years of education: 12th grade   Occupational History  . [employer]     Custodian/maintenance   Social History Main Topics  . Smoking status: Never Smoker  . Smokeless tobacco: Never Used  . Alcohol use No  . Drug use: No  . Sexual activity: Not on file   Other Topics Concern  . Not on file   Social History Narrative   Theme park manager.   Lives with a relative who cooks and keeps up the home.    Family History  Problem Relation Age of Onset  . Breast cancer Sister   . Breast cancer Other   . Colon cancer Neg Hx      Review of Systems  Constitutional: Negative for  chills and fever.  HENT: Negative.   Eyes: Negative.   Respiratory: Negative for cough and shortness of breath.   Cardiovascular: Positive for leg swelling (chronic and bilateral). Negative for chest pain.  Gastrointestinal: Negative for abdominal pain, diarrhea, nausea and vomiting.  Genitourinary: Negative for dysuria, flank pain and hematuria.  Skin: Negative for rash.  Neurological: Negative for dizziness and headaches.  Endo/Heme/Allergies: Negative.   All other systems reviewed and are negative.  Vitals:   06/22/16 1058  BP: 134/80  Pulse: 96  Resp: 16  Temp: 99 F (37.2 C)     Physical Exam  Constitutional: He is oriented to person, place, and time. He appears well-developed and well-nourished.  HENT:  Head: Normocephalic and atraumatic.  Eyes: Conjunctivae  and EOM are normal. Pupils are equal, round, and reactive to light.  Neck: Normal range of motion. Neck supple.  Cardiovascular: Normal rate, regular rhythm and normal heart sounds.   LE: +bilateral edema; +1bilateral distal pulses; warm to touch; +chronic venous stasis skin changes; cap refill <2 secs. Non-tender to touch.  Pulmonary/Chest: Effort normal and breath sounds normal.  Abdominal: Soft. Bowel sounds are normal. He exhibits no distension. There is no tenderness. There is no guarding.  Musculoskeletal:  +tenderness to left groin and hip area.  Lymphadenopathy:    He has no cervical adenopathy.  Neurological: He is alert and oriented to person, place, and time. No sensory deficit. He exhibits normal muscle tone.  Skin: Skin is warm and dry. Capillary refill takes less than 2 seconds.  Psychiatric: He has a normal mood and affect. His behavior is normal.  Vitals reviewed.   Results for orders placed or performed in visit on 06/22/16 (from the past 24 hour(s))  POCT CBC     Status: None   Collection Time: 06/22/16 11:32 AM  Result Value Ref Range   WBC 8.4 4.6 - 10.2 K/uL   Lymph, poc 2.2 0.6 - 3.4   POC LYMPH PERCENT 26.7 10 - 50 %L   MID (cbc) 0.4 0 - 0.9   POC MID % 4.7 0 - 12 %M   POC Granulocyte 5.8 2 - 6.9   Granulocyte percent 68.6 37 - 80 %G   RBC 5.23 4.69 - 6.13 M/uL   Hemoglobin 16.3 14.1 - 18.1 g/dL   HCT, POC 47.0 43.5 - 53.7 %   MCV 89.7 80 - 97 fL   MCH, POC 31.2 27 - 31.2 pg   MCHC 34.7 31.8 - 35.4 g/dL   RDW, POC 12.3 %   Platelet Count, POC 254 142 - 424 K/uL   MPV 8.7 0 - 99.8 fL    ASSESSMENT & PLAN: [place] was seen today for leg pain.  Diagnoses and all orders for this visit:  Left leg pain -     DG HIP UNILAT W OR W/O PELVIS 2-3 VIEWS LEFT; Future -     POCT CBC -     VAS [place] LOWER EXTREMITY ARTERIAL DUPLEX; Future -     VAS [place] LOWER EXTREMITY VENOUS (DVT); Future -     ketorolac (TORADOL) injection 60 mg; Inject 2 mLs (60 mg total) into  the muscle once.  Inguinal strain, left, initial encounter   Patient Instructions    Needs stat ultrasound of left leg. You have an appointment today at 2:30 pm for a Venous Ultrasound, location is [facility] [place]. [address]. [phone]  Please arrive 15 minutes earlier.   IF you  received an x-ray today, you will receive an invoice from [facility]. Please contact [facility] at [phone] with questions or concerns regarding your invoice.   IF you received labwork today, you will receive an invoice from [facility]. Please contact [facility] at [phone] with questions or concerns regarding your invoice.   Our billing staff will not be able to assist you with questions regarding bills from these companies.  You will be contacted with the lab results as soon as they are available. The fastest way to get your results is to activate your My Chart account. Instructions are located on the last page of this paperwork. If you have not heard from [place] regarding the results in 2 weeks, please contact this office.        307pm: Leg U/S negative for DVT.    Agustina Caroli, MD [facility]

## 2016-06-22 NOTE — Patient Instructions (Addendum)
  Needs stat ultrasound of left leg. You have an appointment today at 2:30 pm for a Venous Ultrasound, location is [facility] [place]. [address]. [phone]  Please arrive 15 minutes earlier.   IF you received an x-ray today, you will receive an invoice from [facility]. Please contact [facility] at [phone] with questions or concerns regarding your invoice.   IF you received labwork today, you will receive an invoice from [facility]. Please contact [facility] at [phone] with questions or concerns regarding your invoice.   Our billing staff will not be able to assist you with questions regarding bills from these companies.  You will be contacted with the lab results as soon as they are available. The fastest way to get your results is to activate your My Chart account. Instructions are located on the last page of this paperwork. If you have not heard from [place] regarding the results in 2 weeks, please contact this office.

## 2016-06-22 NOTE — Progress Notes (Signed)
Venous duplex negative for DVT. 

## 2016-06-22 NOTE — Progress Notes (Signed)
j

## 2016-06-23 ENCOUNTER — Telehealth: Payer: Self-pay

## 2016-06-23 NOTE — Telephone Encounter (Signed)
Cardiology l/m for pt to call before 430pm today for an appt. Tomorrow (they had a cx) Otherwise next appt. Is 07/12/16

## 2016-06-24 ENCOUNTER — Ambulatory Visit (HOSPITAL_COMMUNITY)
Admission: RE | Admit: 2016-06-24 | Discharge: 2016-06-24 | Disposition: A | Discharge status: Home / Self Care | Payer: Federal, State, Local not specified - PPO | Source: Ambulatory Visit | Attending: Cardiology | Admitting: Cardiology

## 2016-06-24 DIAGNOSIS — I739 Peripheral vascular disease, unspecified: Secondary | ICD-10-CM | POA: Diagnosis not present

## 2016-06-24 DIAGNOSIS — M79605 Pain in left leg: Secondary | ICD-10-CM

## 2016-06-24 DIAGNOSIS — R938 Abnormal findings on diagnostic imaging of other specified body structures: Secondary | ICD-10-CM | POA: Insufficient documentation

## 2016-06-24 DIAGNOSIS — F319 Bipolar disorder, unspecified: Secondary | ICD-10-CM | POA: Diagnosis not present

## 2016-07-01 DIAGNOSIS — F319 Bipolar disorder, unspecified: Secondary | ICD-10-CM | POA: Diagnosis not present

## 2016-07-02 DIAGNOSIS — S93401A Sprain of unspecified ligament of right ankle, initial encounter: Secondary | ICD-10-CM | POA: Diagnosis not present

## 2016-07-05 ENCOUNTER — Ambulatory Visit (INDEPENDENT_AMBULATORY_CARE_PROVIDER_SITE_OTHER): Payer: Federal, State, Local not specified - PPO

## 2016-07-05 ENCOUNTER — Ambulatory Visit (INDEPENDENT_AMBULATORY_CARE_PROVIDER_SITE_OTHER): Payer: Federal, State, Local not specified - PPO | Admitting: Family Medicine

## 2016-07-05 VITALS — BP 131/83 | HR 95 | Temp 99.2°F | Resp 18 | Ht 64.0 in

## 2016-07-05 DIAGNOSIS — S96819A Strain of other specified muscles and tendons at ankle and foot level, unspecified foot, initial encounter: Secondary | ICD-10-CM | POA: Diagnosis not present

## 2016-07-05 DIAGNOSIS — S8990XA Unspecified injury of unspecified lower leg, initial encounter: Secondary | ICD-10-CM

## 2016-07-05 DIAGNOSIS — S99911D Unspecified injury of right ankle, subsequent encounter: Secondary | ICD-10-CM

## 2016-07-05 DIAGNOSIS — S93491D Sprain of other ligament of right ankle, subsequent encounter: Secondary | ICD-10-CM | POA: Diagnosis not present

## 2016-07-05 DIAGNOSIS — S99929A Unspecified injury of unspecified foot, initial encounter: Secondary | ICD-10-CM

## 2016-07-05 DIAGNOSIS — S93499A Sprain of other ligament of unspecified ankle, initial encounter: Secondary | ICD-10-CM | POA: Diagnosis not present

## 2016-07-05 DIAGNOSIS — S99911A Unspecified injury of right ankle, initial encounter: Secondary | ICD-10-CM | POA: Diagnosis not present

## 2016-07-05 DIAGNOSIS — S99919A Unspecified injury of unspecified ankle, initial encounter: Secondary | ICD-10-CM | POA: Diagnosis not present

## 2016-07-05 DIAGNOSIS — Z5189 Encounter for other specified aftercare: Secondary | ICD-10-CM | POA: Diagnosis not present

## 2016-07-05 DIAGNOSIS — M7989 Other specified soft tissue disorders: Secondary | ICD-10-CM | POA: Diagnosis not present

## 2016-07-05 MED ORDER — IBUPROFEN 600 MG PO TABS: 600.0000 mg | ORAL_TABLET | Freq: Three times a day (TID) | ORAL | 0 refills | Status: DC | PRN

## 2016-07-05 NOTE — Progress Notes (Signed)
Chief Complaint  Patient presents with  . Ankle Pain    twisted right ankle  went to [facility] saturday and didn't get any medication and only 2 days off     HPI   Pt reports that he twisted his right ankle on Saturday, 3 days ago, he went to [facility]  He reports that he had an xray done that was told it was it was a sprain. He was discharged home with crutches.  He is taking ibuprofen every 8 hours. He reports that was wrapped in an ace bandage and the wrap was removed last night.  He reports that he is icing it daily and proping it up His pain is 3/10 He reports that it hurts to stand on it and walk on it   Past Medical History:  Diagnosis Date  . ALLERGIC RHINITIS   . Dependent edema    chronic L>R  . GERD (gastroesophageal reflux disease)   . Hypertension   . Mental retardation    mild  . Obesity   . OBSTRUCTIVE SLEEP APNEA    noncompliant with CPAP  . Type II or unspecified type diabetes mellitus without mention of complication, not stated as uncontrolled    diet controlled    Current Outpatient Prescriptions  Medication Sig Dispense Refill  . aspirin EC 81 MG tablet Take 1 tablet (81 mg total) by mouth daily. 150 tablet 2  . Calcium Polycarbophil (FIBER) 625 MG TABS Taking one daily 14 each 0  . Elastic Bandages & Supports (MEDICAL COMPRESSION SOCKS) MISC Medium compression 20-30 mm Hg for chronic leg edema 4 each 0  . furosemide (LASIX) 20 MG tablet Take 1 tablet (20 mg total) by mouth daily. 90 tablet 1  . multivitamin (ONE-A-DAY MEN'S) TABS tablet Take 1 tablet by mouth daily.  0  . ramipril (ALTACE) 5 MG capsule take 1 capsule by mouth once daily 90 capsule 0  . sildenafil (REVATIO) 20 MG tablet Take 3-5 pills as needed 50 tablet 5  . tamsulosin (FLOMAX) 0.4 MG CAPS capsule Take 0.4 mg by mouth daily.  0  . ibuprofen (ADVIL,MOTRIN) 600 MG tablet Take 1 tablet (600 mg total) by mouth every 8 (eight) hours as needed. 30 tablet 0   No current facility-administered  medications for this visit.     Allergies:  Allergies  Allergen Reactions  . Clarithromycin Nausea Only    Past Surgical History:  Procedure Laterality Date  . NASAL SEPTUM SURGERY    . ROTATOR CUFF REPAIR Left 06/2014   [name]    Social History   Social History  . Marital status: Single    Spouse name: n/a  . Number of children: 0  . Years of education: 12th grade   Occupational History  . [employer]     Custodian/maintenance   Social History Main Topics  . Smoking status: Never Smoker  . Smokeless tobacco: Never Used  . Alcohol use No  . Drug use: No  . Sexual activity: Not Asked   Other Topics Concern  . 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    ROS  Objective: Vitals:   07/05/16 1049 07/05/16 1204  BP: (!) 141/71 131/83  Pulse: (!) 104 95  Resp: 18   Temp: 99.2 F (37.3 C)   TempSrc: Oral   SpO2: 96%   Height: 5\' 4"  (1.626 m)     Physical Exam  Constitutional: He appears well-developed and well-nourished.  HENT:  Head: Normocephalic and atraumatic.  Cardiovascular: Normal rate, regular rhythm and normal heart sounds.   Pulmonary/Chest: Effort normal and breath sounds normal. No respiratory distress.    Left foot nontender with normal range of motion  Right foot exam Tenderness to palpation at the lateral malleolus  Soft tissue edema noted at the lateral malleolus Cap refill <2s   Assessment and Plan Alejandro Brown was seen today for ankle pain.  Diagnoses and all orders for this visit:  Injury of right ankle, subsequent encounter -     DG Ankle Complete Right -     Apply ace wrap  Sprain of other ligament of right ankle, subsequent encounter Gave work note for light duty and to sit at work Advised rest, ice and elevation as well as the appropriate dose of nsaids -     ibuprofen (ADVIL,MOTRIN) 600 MG tablet; Take 1 tablet (600 mg total) by mouth every 8 (eight)  hours as needed.     [name]

## 2016-07-05 NOTE — Addendum Note (Signed)
Addended by: Delia Chimes A on: 07/05/2016 12:34 PM   Modules accepted: Level of Service

## 2016-07-05 NOTE — Patient Instructions (Addendum)
     IF you received an x-ray today, you will receive an invoice from [facility]. Please contact [facility] at [phone] with questions or concerns regarding your invoice.   IF you received labwork today, you will receive an invoice from [facility]. Please contact [facility] at [phone] with questions or concerns regarding your invoice.   Our billing staff will not be able to assist you with questions regarding bills from these companies.  You will be contacted with the lab results as soon as they are available. The fastest way to get your results is to activate your My Chart account. Instructions are located on the last page of this paperwork. If you have not heard from us regarding the results in 2 weeks, please contact this office.      Ankle Sprain An ankle sprain is a stretch or tear in one of the tough tissues (ligaments) in your ankle. Follow these instructions at home:  Rest your ankle.  Take over-the-counter and prescription medicines only as told by your doctor.  For 2-3 days, keep your ankle higher than the level of your heart (elevated) as much as possible.  If directed, put ice on the area:  Put ice in a plastic bag.  Place a towel between your skin and the bag.  Leave the ice on for 20 minutes, 2-3 times a day.  If you were given a brace:  Wear it as told.  Take it off to shower or bathe.  Try not to move your ankle much, but wiggle your toes from time to time. This helps to prevent swelling.  If you were given an elastic bandage (dressing):  Take it off when you shower or bathe.  Try not to move your ankle much, but wiggle your toes from time to time. This helps to prevent swelling.  Adjust the bandage to make it more comfortable if it feels too tight.  Loosen the bandage if you lose feeling in your foot, your foot tingles, or your foot gets cold and blue.  If you have crutches, use them as told by your doctor. Continue to use  them until you can walk without feeling pain in your ankle. Contact a doctor if:  Your bruises or swelling are quickly getting worse.  Your pain does not get better after you take medicine. Get help right away if:  You cannot feel your toes or foot.  Your toes or your foot looks blue.  You have very bad pain that gets worse. This information is not intended to replace advice given to you by your health care provider. Make sure you discuss any questions you have with your health care provider. Document Released: 08/24/2007 Document Revised: 08/13/2015 Document Reviewed: 10/07/2014 Elsevier Interactive Patient Education  2017 Elsevier Inc.  

## 2016-07-06 DIAGNOSIS — F319 Bipolar disorder, unspecified: Secondary | ICD-10-CM | POA: Diagnosis not present

## 2016-07-13 DIAGNOSIS — F319 Bipolar disorder, unspecified: Secondary | ICD-10-CM | POA: Diagnosis not present

## 2016-07-19 DIAGNOSIS — F319 Bipolar disorder, unspecified: Secondary | ICD-10-CM | POA: Diagnosis not present

## 2016-07-22 ENCOUNTER — Other Ambulatory Visit: Payer: Self-pay | Admitting: Internal Medicine

## 2016-07-29 DIAGNOSIS — F319 Bipolar disorder, unspecified: Secondary | ICD-10-CM | POA: Diagnosis not present

## 2016-08-04 DIAGNOSIS — F319 Bipolar disorder, unspecified: Secondary | ICD-10-CM | POA: Diagnosis not present

## 2016-08-08 DIAGNOSIS — F319 Bipolar disorder, unspecified: Secondary | ICD-10-CM | POA: Diagnosis not present

## 2016-08-17 DIAGNOSIS — F319 Bipolar disorder, unspecified: Secondary | ICD-10-CM | POA: Diagnosis not present

## 2016-08-26 DIAGNOSIS — F319 Bipolar disorder, unspecified: Secondary | ICD-10-CM | POA: Diagnosis not present

## 2016-09-05 ENCOUNTER — Other Ambulatory Visit: Payer: Self-pay | Admitting: Internal Medicine

## 2016-09-08 DIAGNOSIS — F319 Bipolar disorder, unspecified: Secondary | ICD-10-CM | POA: Diagnosis not present

## 2016-09-15 DIAGNOSIS — F319 Bipolar disorder, unspecified: Secondary | ICD-10-CM | POA: Diagnosis not present

## 2016-09-20 ENCOUNTER — Encounter: Payer: Self-pay | Admitting: Internal Medicine

## 2016-09-20 ENCOUNTER — Other Ambulatory Visit: Payer: Self-pay | Admitting: Internal Medicine

## 2016-09-20 DIAGNOSIS — R609 Edema, unspecified: Secondary | ICD-10-CM

## 2016-09-20 MED ORDER — MEDICAL COMPRESSION SOCKS MISC: 0 refills | Status: AC

## 2016-09-23 DIAGNOSIS — F319 Bipolar disorder, unspecified: Secondary | ICD-10-CM | POA: Diagnosis not present

## 2016-09-27 DIAGNOSIS — F319 Bipolar disorder, unspecified: Secondary | ICD-10-CM | POA: Diagnosis not present

## 2016-10-03 DIAGNOSIS — F319 Bipolar disorder, unspecified: Secondary | ICD-10-CM | POA: Diagnosis not present

## 2016-10-13 DIAGNOSIS — F319 Bipolar disorder, unspecified: Secondary | ICD-10-CM | POA: Diagnosis not present

## 2016-10-17 DIAGNOSIS — F319 Bipolar disorder, unspecified: Secondary | ICD-10-CM | POA: Diagnosis not present

## 2016-10-27 DIAGNOSIS — F319 Bipolar disorder, unspecified: Secondary | ICD-10-CM | POA: Diagnosis not present

## 2016-11-01 DIAGNOSIS — F319 Bipolar disorder, unspecified: Secondary | ICD-10-CM | POA: Diagnosis not present

## 2016-11-10 DIAGNOSIS — F319 Bipolar disorder, unspecified: Secondary | ICD-10-CM | POA: Diagnosis not present

## 2016-11-17 DIAGNOSIS — F319 Bipolar disorder, unspecified: Secondary | ICD-10-CM | POA: Diagnosis not present

## 2016-11-24 DIAGNOSIS — F319 Bipolar disorder, unspecified: Secondary | ICD-10-CM | POA: Diagnosis not present

## 2016-11-29 DIAGNOSIS — F319 Bipolar disorder, unspecified: Secondary | ICD-10-CM | POA: Diagnosis not present

## 2016-12-09 ENCOUNTER — Other Ambulatory Visit: Payer: Self-pay | Admitting: Internal Medicine

## 2016-12-09 DIAGNOSIS — F319 Bipolar disorder, unspecified: Secondary | ICD-10-CM | POA: Diagnosis not present

## 2016-12-14 DIAGNOSIS — F319 Bipolar disorder, unspecified: Secondary | ICD-10-CM | POA: Diagnosis not present

## 2016-12-15 DIAGNOSIS — F319 Bipolar disorder, unspecified: Secondary | ICD-10-CM | POA: Diagnosis not present

## 2016-12-15 NOTE — Assessment & Plan Note (Signed)
wearing compression sock  Takes lasix 20 mg daily Stable cmp

## 2016-12-15 NOTE — Progress Notes (Signed)
Subjective:    Patient ID: Alejandro Brown, male    DOB: 15-Mar-1956, 61 y.o.   MRN: [id number]  HPI The patient is here for follow up.  Hypertension: He is taking his medication daily. He is compliant with a low sodium diet.  He denies chest pain, palpitations, edema, shortness of breath and regular headaches. He is active, but he is not exercising regularly.  He does not monitor his blood pressure at home.    Diabetes: He is taking his medication daily as prescribed. He is compliant with a diabetic diet. He is active at work, but not exercising regularly. He checks his feet daily and denies foot lesions. He is up-to-date with an ophthalmology examination.   Leg edema, chronic:  He is taking lasix daily.  His edema is well controlled.  He wears compression socks on his left leg.    Medications and allergies reviewed with patient and updated if appropriate.  Patient Active Problem List   Diagnosis Date Noted  . Inguinal strain, left, initial encounter 06/22/2016  . Umbilical hernia 03/23/7251  . Enlarged prostate 06/12/2015  . Elevated PSA 06/12/2015  . Ventral hernia 06/12/2015  . HTN (hypertension) 08/31/2010  . Diabetes type 2, controlled ([place]) 08/31/2010  . Obesity 08/31/2010  . Mental retardation 08/31/2010  . Dependent edema 08/31/2010  . Obstructive sleep apnea 05/05/2007  . ALLERGIC RHINITIS 05/04/2007    Current Outpatient Prescriptions on File Prior to Visit  Medication Sig Dispense Refill  . aspirin EC 81 MG tablet Take 1 tablet (81 mg total) by mouth daily. 150 tablet 2  . Calcium Polycarbophil (FIBER) 625 MG TABS Taking one daily 14 each 0  . Elastic Bandages & Supports (MEDICAL COMPRESSION SOCKS) MISC Medium compression 20-30 mm Hg for chronic leg edema 4 each 0  . furosemide (LASIX) 20 MG tablet take 1 tablet by mouth once daily 90 tablet 1  . ibuprofen (ADVIL,MOTRIN) 600 MG tablet Take 1 tablet (600 mg total) by mouth every 8 (eight) hours as needed. 30 tablet  0  . multivitamin (ONE-A-DAY MEN'S) TABS tablet Take 1 tablet by mouth daily.  0  . ramipril (ALTACE) 5 MG capsule take 1 capsule by mouth once daily 90 capsule 2  . tamsulosin (FLOMAX) 0.4 MG CAPS capsule Take 0.4 mg by mouth daily.  0  . sildenafil (REVATIO) 20 MG tablet Take 3-5 pills as needed (Patient not taking: Reported on 12/16/2016) 50 tablet 5   No current facility-administered medications on file prior to visit.     Past Medical History:  Diagnosis Date  . ALLERGIC RHINITIS   . Dependent edema    chronic L>R  . GERD (gastroesophageal reflux disease)   . Hypertension   . Mental retardation    mild  . Obesity   . OBSTRUCTIVE SLEEP APNEA    noncompliant with CPAP  . Type II or unspecified type diabetes mellitus without mention of complication, not stated as uncontrolled    diet controlled    Past Surgical History:  Procedure Laterality Date  . NASAL SEPTUM SURGERY    . ROTATOR CUFF REPAIR Left 06/2014   [name]    Social History   Social History  . Marital status: Single    Spouse name: n/a  . Number of children: 0  . Years of education: 12th grade   Occupational History  . [employer]     Custodian/maintenance   Social History Main Topics  . Smoking status: Never Smoker  . Smokeless tobacco:  Never Used  . Alcohol use No  . Drug use: No  . Sexual activity: Not Asked   Other Topics Concern  . 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    Family History  Problem Relation Age of Onset  . Breast cancer Sister   . Breast cancer Other   . Colon cancer Neg Hx     Review of Systems  Constitutional: Negative for chills and fever.  Respiratory: Positive for shortness of breath (stairs only, little bit with flat surfaces). Negative for cough and wheezing.   Cardiovascular: Positive for leg swelling. Negative for chest pain and palpitations.  Neurological: Negative for dizziness,  light-headedness and headaches.       Objective:   Vitals:   12/16/16 1049  BP: 136/78  Pulse: 89  Resp: 16  Temp: 98.8 F (37.1 C)  SpO2: 96%   Wt Readings from Last 3 Encounters:  12/16/16 247 lb (112 kg)  06/10/16 241 lb (109.3 kg)  05/05/16 232 lb 3.2 oz (105.3 kg)   Body mass index is 42.4 kg/m.   Physical Exam    Constitutional: Appears well-developed and well-nourished. No distress.  HENT:  Head: Normocephalic and atraumatic.  Neck: Neck supple. No tracheal deviation present. No thyromegaly present.  No cervical lymphadenopathy Cardiovascular: Normal rate, regular rhythm and normal heart sounds.   No murmur heard. No carotid bruit .   Bilateral lower extremity non pitting edema -left leg worse than right, wearing compression sock on eft leg  Pulmonary/Chest: Effort normal and breath sounds normal. No respiratory distress. No has no wheezes. No rales.  Skin: Skin is warm and dry. Not diaphoretic.  Psychiatric: Normal mood and affect. Behavior is normal.      Assessment & Plan:    See Problem List for Assessment and Plan of chronic medical problems.

## 2016-12-15 NOTE — Assessment & Plan Note (Addendum)
Chronic, stable Wearing compression sock - continue Continue lasix 20 mg daily cmp

## 2016-12-15 NOTE — Patient Instructions (Addendum)

## 2016-12-16 ENCOUNTER — Encounter: Payer: Self-pay | Admitting: Internal Medicine

## 2016-12-16 ENCOUNTER — Ambulatory Visit (INDEPENDENT_AMBULATORY_CARE_PROVIDER_SITE_OTHER): Payer: Federal, State, Local not specified - PPO | Admitting: Internal Medicine

## 2016-12-16 ENCOUNTER — Other Ambulatory Visit (INDEPENDENT_AMBULATORY_CARE_PROVIDER_SITE_OTHER): Payer: Federal, State, Local not specified - PPO

## 2016-12-16 VITALS — BP 136/78 | HR 89 | Temp 98.8°F | Resp 16 | Wt 247.0 lb

## 2016-12-16 DIAGNOSIS — Z23 Encounter for immunization: Secondary | ICD-10-CM

## 2016-12-16 DIAGNOSIS — I10 Essential (primary) hypertension: Secondary | ICD-10-CM

## 2016-12-16 DIAGNOSIS — E119 Type 2 diabetes mellitus without complications: Secondary | ICD-10-CM

## 2016-12-16 DIAGNOSIS — R609 Edema, unspecified: Secondary | ICD-10-CM

## 2016-12-16 DIAGNOSIS — Z6841 Body Mass Index (BMI) 40.0 and over, adult: Secondary | ICD-10-CM | POA: Diagnosis not present

## 2016-12-16 LAB — COMPREHENSIVE METABOLIC PANEL
ALBUMIN: 3.8 g/dL (ref 3.5–5.2)
ALT: 15 U/L (ref 0–53)
AST: 12 U/L (ref 0–37)
Alkaline Phosphatase: 46 U/L (ref 39–117)
BUN: 15 mg/dL (ref 6–23)
CALCIUM: 9.4 mg/dL (ref 8.4–10.5)
CHLORIDE: 104 meq/L (ref 96–112)
CO2: 30 meq/L (ref 19–32)
Creatinine, Ser: 1.03 mg/dL (ref 0.40–1.50)
GFR: 77.55 mL/min (ref >60.00)
Glucose, Bld: 135 mg/dL — ABNORMAL HIGH (ref 70–99)
Potassium: 4.4 mEq/L (ref 3.5–5.1)
Sodium: 139 mEq/L (ref 135–145)
Total Bilirubin: 0.6 mg/dL (ref 0.2–1.2)
Total Protein: 6.3 g/dL (ref 6.0–8.3)

## 2016-12-16 LAB — HEMOGLOBIN A1C: Hgb A1c MFr Bld: 6.1 % (ref 4.6–6.5)

## 2016-12-16 NOTE — Assessment & Plan Note (Signed)
Diet controlled Check a1c Low sugar / carb diet Stressed regular exercise, advised weight loss Consider starting medication since it may aid in weight loss

## 2016-12-16 NOTE — Assessment & Plan Note (Signed)
Stressed the importance of weight lss Advised decreasing potions and eating a healthy diet Cut back on snacks   decrease soda intake  he deferred nutrition  referral

## 2016-12-16 NOTE — Assessment & Plan Note (Signed)
BP well controlled Current regimen effective and well tolerated Continue current medications at current doses cmp  

## 2016-12-17 ENCOUNTER — Encounter: Payer: Self-pay | Admitting: Internal Medicine

## 2016-12-22 DIAGNOSIS — F319 Bipolar disorder, unspecified: Secondary | ICD-10-CM | POA: Diagnosis not present

## 2016-12-28 DIAGNOSIS — F319 Bipolar disorder, unspecified: Secondary | ICD-10-CM | POA: Diagnosis not present

## 2016-12-30 DIAGNOSIS — R972 Elevated prostate specific antigen [PSA]: Secondary | ICD-10-CM | POA: Diagnosis not present

## 2017-01-02 DIAGNOSIS — F319 Bipolar disorder, unspecified: Secondary | ICD-10-CM | POA: Diagnosis not present

## 2017-01-06 DIAGNOSIS — N138 Other obstructive and reflux uropathy: Secondary | ICD-10-CM | POA: Diagnosis not present

## 2017-01-09 DIAGNOSIS — F319 Bipolar disorder, unspecified: Secondary | ICD-10-CM | POA: Diagnosis not present

## 2017-01-12 DIAGNOSIS — F319 Bipolar disorder, unspecified: Secondary | ICD-10-CM | POA: Diagnosis not present

## 2017-01-16 DIAGNOSIS — F319 Bipolar disorder, unspecified: Secondary | ICD-10-CM | POA: Diagnosis not present

## 2017-01-18 DIAGNOSIS — F319 Bipolar disorder, unspecified: Secondary | ICD-10-CM | POA: Diagnosis not present

## 2017-01-23 DIAGNOSIS — F319 Bipolar disorder, unspecified: Secondary | ICD-10-CM | POA: Diagnosis not present

## 2017-01-26 DIAGNOSIS — F319 Bipolar disorder, unspecified: Secondary | ICD-10-CM | POA: Diagnosis not present

## 2017-01-31 DIAGNOSIS — F319 Bipolar disorder, unspecified: Secondary | ICD-10-CM | POA: Diagnosis not present

## 2017-02-02 ENCOUNTER — Other Ambulatory Visit: Payer: Self-pay | Admitting: Internal Medicine

## 2017-02-02 DIAGNOSIS — F319 Bipolar disorder, unspecified: Secondary | ICD-10-CM | POA: Diagnosis not present

## 2017-02-06 DIAGNOSIS — F319 Bipolar disorder, unspecified: Secondary | ICD-10-CM | POA: Diagnosis not present

## 2017-02-13 DIAGNOSIS — F319 Bipolar disorder, unspecified: Secondary | ICD-10-CM | POA: Diagnosis not present

## 2017-02-16 DIAGNOSIS — F319 Bipolar disorder, unspecified: Secondary | ICD-10-CM | POA: Diagnosis not present

## 2017-02-21 DIAGNOSIS — F319 Bipolar disorder, unspecified: Secondary | ICD-10-CM | POA: Diagnosis not present

## 2017-02-22 DIAGNOSIS — F319 Bipolar disorder, unspecified: Secondary | ICD-10-CM | POA: Diagnosis not present

## 2017-02-24 DIAGNOSIS — F319 Bipolar disorder, unspecified: Secondary | ICD-10-CM | POA: Diagnosis not present

## 2017-03-02 DIAGNOSIS — F319 Bipolar disorder, unspecified: Secondary | ICD-10-CM | POA: Diagnosis not present

## 2017-03-03 DIAGNOSIS — F319 Bipolar disorder, unspecified: Secondary | ICD-10-CM | POA: Diagnosis not present

## 2017-03-07 DIAGNOSIS — F319 Bipolar disorder, unspecified: Secondary | ICD-10-CM | POA: Diagnosis not present

## 2017-03-10 DIAGNOSIS — F319 Bipolar disorder, unspecified: Secondary | ICD-10-CM | POA: Diagnosis not present

## 2017-03-17 DIAGNOSIS — E119 Type 2 diabetes mellitus without complications: Secondary | ICD-10-CM | POA: Diagnosis not present

## 2017-03-17 DIAGNOSIS — H2513 Age-related nuclear cataract, bilateral: Secondary | ICD-10-CM | POA: Diagnosis not present

## 2017-03-17 DIAGNOSIS — F319 Bipolar disorder, unspecified: Secondary | ICD-10-CM | POA: Diagnosis not present

## 2017-03-17 DIAGNOSIS — H5203 Hypermetropia, bilateral: Secondary | ICD-10-CM | POA: Diagnosis not present

## 2017-03-17 LAB — HM DIABETES EYE EXAM

## 2017-03-20 DIAGNOSIS — F319 Bipolar disorder, unspecified: Secondary | ICD-10-CM | POA: Diagnosis not present

## 2017-03-23 ENCOUNTER — Encounter: Payer: Self-pay | Admitting: Internal Medicine

## 2017-03-24 DIAGNOSIS — F319 Bipolar disorder, unspecified: Secondary | ICD-10-CM | POA: Diagnosis not present

## 2017-03-27 DIAGNOSIS — F319 Bipolar disorder, unspecified: Secondary | ICD-10-CM | POA: Diagnosis not present

## 2017-03-31 DIAGNOSIS — F319 Bipolar disorder, unspecified: Secondary | ICD-10-CM | POA: Diagnosis not present

## 2017-04-06 DIAGNOSIS — F319 Bipolar disorder, unspecified: Secondary | ICD-10-CM | POA: Diagnosis not present

## 2017-04-11 DIAGNOSIS — F319 Bipolar disorder, unspecified: Secondary | ICD-10-CM | POA: Diagnosis not present

## 2017-04-12 DIAGNOSIS — F319 Bipolar disorder, unspecified: Secondary | ICD-10-CM | POA: Diagnosis not present

## 2017-04-17 DIAGNOSIS — F319 Bipolar disorder, unspecified: Secondary | ICD-10-CM | POA: Diagnosis not present

## 2017-04-18 DIAGNOSIS — F319 Bipolar disorder, unspecified: Secondary | ICD-10-CM | POA: Diagnosis not present

## 2017-04-19 DIAGNOSIS — F319 Bipolar disorder, unspecified: Secondary | ICD-10-CM | POA: Diagnosis not present

## 2017-04-25 DIAGNOSIS — F319 Bipolar disorder, unspecified: Secondary | ICD-10-CM | POA: Diagnosis not present

## 2017-04-27 DIAGNOSIS — F319 Bipolar disorder, unspecified: Secondary | ICD-10-CM | POA: Diagnosis not present

## 2017-04-28 DIAGNOSIS — F319 Bipolar disorder, unspecified: Secondary | ICD-10-CM | POA: Diagnosis not present

## 2017-05-01 DIAGNOSIS — F319 Bipolar disorder, unspecified: Secondary | ICD-10-CM | POA: Diagnosis not present

## 2017-05-04 DIAGNOSIS — F319 Bipolar disorder, unspecified: Secondary | ICD-10-CM | POA: Diagnosis not present

## 2017-05-08 DIAGNOSIS — F319 Bipolar disorder, unspecified: Secondary | ICD-10-CM | POA: Diagnosis not present

## 2017-05-11 DIAGNOSIS — F319 Bipolar disorder, unspecified: Secondary | ICD-10-CM | POA: Diagnosis not present

## 2017-05-15 DIAGNOSIS — F319 Bipolar disorder, unspecified: Secondary | ICD-10-CM | POA: Diagnosis not present

## 2017-05-17 DIAGNOSIS — F319 Bipolar disorder, unspecified: Secondary | ICD-10-CM | POA: Diagnosis not present

## 2017-05-22 DIAGNOSIS — F319 Bipolar disorder, unspecified: Secondary | ICD-10-CM | POA: Diagnosis not present

## 2017-05-25 DIAGNOSIS — F319 Bipolar disorder, unspecified: Secondary | ICD-10-CM | POA: Diagnosis not present

## 2017-05-29 DIAGNOSIS — F319 Bipolar disorder, unspecified: Secondary | ICD-10-CM | POA: Diagnosis not present

## 2017-06-01 DIAGNOSIS — F319 Bipolar disorder, unspecified: Secondary | ICD-10-CM | POA: Diagnosis not present

## 2017-06-06 DIAGNOSIS — F319 Bipolar disorder, unspecified: Secondary | ICD-10-CM | POA: Diagnosis not present

## 2017-06-12 DIAGNOSIS — F319 Bipolar disorder, unspecified: Secondary | ICD-10-CM | POA: Diagnosis not present

## 2017-06-15 ENCOUNTER — Other Ambulatory Visit: Payer: Self-pay | Admitting: *Deleted

## 2017-06-15 ENCOUNTER — Telehealth: Payer: Self-pay | Admitting: Internal Medicine

## 2017-06-15 MED ORDER — RAMIPRIL 5 MG PO CAPS: 5.0000 mg | ORAL_CAPSULE | Freq: Every day | ORAL | 0 refills | Status: DC

## 2017-06-15 NOTE — Telephone Encounter (Unsigned)
Copied from [place] [id number]. Topic: Quick Communication - Rx Refill/Question >> Jun 15, 2017 11:04 AM Carolyn Stare wrote: Medication  ramipril (ALTACE) 5 MG capsule  Has the patient contacted their pharmacy yes    Preferred Pharmacy  [facility]   Agent: Please be advised that RX refills may take up to 3 business days. We ask that you follow-up with your pharmacy.

## 2017-06-15 NOTE — Telephone Encounter (Signed)
Rx refill provided- LOV: 12/16/16 and patient is due and has appointment scheduled 06/30/17. No further refills granted. Attempted to call patient to verify pharmacy- unable to reach patient or leave message- Rx sent to [facility].

## 2017-06-16 ENCOUNTER — Ambulatory Visit: Payer: Federal, State, Local not specified - PPO | Admitting: Internal Medicine

## 2017-06-16 DIAGNOSIS — F319 Bipolar disorder, unspecified: Secondary | ICD-10-CM | POA: Diagnosis not present

## 2017-06-19 DIAGNOSIS — F319 Bipolar disorder, unspecified: Secondary | ICD-10-CM | POA: Diagnosis not present

## 2017-06-22 DIAGNOSIS — F319 Bipolar disorder, unspecified: Secondary | ICD-10-CM | POA: Diagnosis not present

## 2017-06-26 DIAGNOSIS — F319 Bipolar disorder, unspecified: Secondary | ICD-10-CM | POA: Diagnosis not present

## 2017-06-29 ENCOUNTER — Other Ambulatory Visit: Payer: Self-pay | Admitting: Internal Medicine

## 2017-06-29 NOTE — Progress Notes (Signed)
Subjective:    Patient ID: Alejandro Brown, male    DOB: 04-05-1955, 62 y.o.   MRN: [id number]  HPI The patient is here for follow up.  When he came here he thought he may have gained weight, but has actually lost some weight.  He states he is walking his dog every day.  He thinks he is eating pretty well.  Diabetes: He is taking his medication daily as prescribed. He is compliant with a diabetic diet. He is exercising regularly - walking dog 30-40 minutes a day.  He checks his feet daily and denies foot lesions. He is up-to-date with an ophthalmology examination.   Hypertension: He is taking his medication daily. He is compliant with a low sodium diet.  He denies chest pain, palpitations, shortness of breath and regular headaches.  He has chronic leg edema in the left leg and wears a compression sock daily.  He denies any changes in the swelling.  He is exercising regularly.       Edema, left lower extremity more than right: He uses the compression sock daily in the left leg only.  He is taking the Lasix daily.  Overall his swelling is controlled and he denies any changes.  Medications and allergies reviewed with patient and updated if appropriate.  Patient Active Problem List   Diagnosis Date Noted  . Inguinal strain, left, initial encounter 06/22/2016  . Umbilical hernia 50/35/4656  . Enlarged prostate 06/12/2015  . Elevated PSA 06/12/2015  . Ventral hernia 06/12/2015  . HTN (hypertension) 08/31/2010  . Diabetes type 2, controlled ([place]) 08/31/2010  . Obesity 08/31/2010  . Mental retardation 08/31/2010  . Dependent edema 08/31/2010  . Obstructive sleep apnea 05/05/2007  . ALLERGIC RHINITIS 05/04/2007    Current Outpatient Medications on File Prior to Visit  Medication Sig Dispense Refill  . aspirin EC 81 MG tablet Take 1 tablet (81 mg total) by mouth daily. 150 tablet 2  . Calcium Polycarbophil (FIBER) 625 MG TABS Taking one daily 14 each 0  . Elastic Bandages & Supports  (MEDICAL COMPRESSION SOCKS) MISC Medium compression 20-30 mm Hg for chronic leg edema 4 each 0  . furosemide (LASIX) 20 MG tablet TAKE 1 TABLET BY MOUTH ONCE DAILY 90 tablet 0  . multivitamin (ONE-A-DAY MEN'S) TABS tablet Take 1 tablet by mouth daily.  0  . ramipril (ALTACE) 5 MG capsule Take 1 capsule (5 mg total) by mouth daily. 90 capsule 0  . tamsulosin (FLOMAX) 0.4 MG CAPS capsule Take 0.4 mg by mouth daily.  0   No current facility-administered medications on file prior to visit.     Past Medical History:  Diagnosis Date  . ALLERGIC RHINITIS   . Dependent edema    chronic L>R  . GERD (gastroesophageal reflux disease)   . Hypertension   . Mental retardation    mild  . Obesity   . OBSTRUCTIVE SLEEP APNEA    noncompliant with CPAP  . Type II or unspecified type diabetes mellitus without mention of complication, not stated as uncontrolled    diet controlled    Past Surgical History:  Procedure Laterality Date  . NASAL SEPTUM SURGERY    . ROTATOR CUFF REPAIR Left 06/2014   [name]    Social History   Socioeconomic History  . Marital status: Single    Spouse name: n/a  . Number of children: 0  . Years of education: 12th grade  . Highest education level: Not on file  Occupational  History  . 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 Financial resource strain: Not on file  . Food insecurity:    Worry: Not on file    Inability: Not on file  . Transportation needs:    Medical: Not on file    Non-medical: Not on file  Tobacco Use  . Smoking status: Never Smoker  . Smokeless tobacco: Never Used  Substance and Sexual Activity  . Alcohol use: No    Alcohol/week: 0.0 oz  . Drug use: No  . Sexual activity: Not on file  Lifestyle  . Physical activity:    Days per week: Not on file    Minutes per session: Not on file  . Stress: Not on file  Relationships  . Social connections:    Talks on phone: Not on file    Gets together: Not on file      Attends religious service: Not on file    Active member of club or organization: Not on file    Attends meetings of clubs or organizations: Not on file    Relationship status: Not on file  Other Topics Concern  . 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    Family History  Problem Relation Age of Onset  . Breast cancer Sister   . Breast cancer Other   . Colon cancer Neg Hx     Review of Systems  Constitutional: Negative for chills and fever.  Respiratory: Negative for cough, shortness of breath and wheezing.   Cardiovascular: Positive for leg swelling (chronic leg edema - no change). Negative for chest pain and palpitations.  Neurological: Negative for light-headedness and headaches.       Objective:   Vitals:   06/30/17 1054  BP: 132/78  Pulse: 74  Resp: 18  Temp: 98.4 F (36.9 C)  SpO2: 95%   BP Readings from Last 3 Encounters:  06/30/17 132/78  12/16/16 136/78  07/05/16 131/83   Wt Readings from Last 3 Encounters:  06/30/17 240 lb (108.9 kg)  12/16/16 247 lb (112 kg)  06/10/16 241 lb (109.3 kg)   Body mass index is 41.2 kg/m.   Physical Exam    Constitutional: Appears well-developed and well-nourished. No distress.  HENT:  Head: Normocephalic and atraumatic.  Neck: Neck supple. No tracheal deviation present. No thyromegaly present.  No cervical lymphadenopathy Cardiovascular: Normal rate, regular rhythm and normal heart sounds.   No murmur heard. No carotid bruit .  Trace nonpitting edema bilateral lower extremities; bilateral varicose veins lower extremities Pulmonary/Chest: Effort normal and breath sounds normal. No respiratory distress. No has no wheezes. No rales.  Skin: Skin is warm and dry. Not diaphoretic.  Psychiatric: Normal mood and affect. Behavior is normal.   Diabetic Foot Exam - Simple   Simple Foot Form Diabetic Foot exam was performed with the  following findings:  Yes   Visual Inspection No deformities, no ulcerations, no other skin breakdown bilaterally:  Yes Sensation Testing Intact to touch and monofilament testing bilaterally:  Yes Pulse Check Posterior Tibialis and Dorsalis pulse intact bilaterally:  Yes Comments       Assessment & Plan:    See Problem List for Assessment and Plan of chronic medical problems.

## 2017-06-29 NOTE — Patient Instructions (Addendum)

## 2017-06-30 ENCOUNTER — Ambulatory Visit (INDEPENDENT_AMBULATORY_CARE_PROVIDER_SITE_OTHER): Payer: Federal, State, Local not specified - PPO | Admitting: Internal Medicine

## 2017-06-30 ENCOUNTER — Encounter: Payer: Self-pay | Admitting: Internal Medicine

## 2017-06-30 ENCOUNTER — Other Ambulatory Visit (INDEPENDENT_AMBULATORY_CARE_PROVIDER_SITE_OTHER): Payer: Federal, State, Local not specified - PPO

## 2017-06-30 VITALS — BP 132/78 | HR 74 | Temp 98.4°F | Resp 18 | Ht 64.0 in | Wt 240.0 lb

## 2017-06-30 DIAGNOSIS — R609 Edema, unspecified: Secondary | ICD-10-CM

## 2017-06-30 DIAGNOSIS — E119 Type 2 diabetes mellitus without complications: Secondary | ICD-10-CM | POA: Diagnosis not present

## 2017-06-30 DIAGNOSIS — I10 Essential (primary) hypertension: Secondary | ICD-10-CM | POA: Diagnosis not present

## 2017-06-30 LAB — COMPREHENSIVE METABOLIC PANEL
ALBUMIN: 4 g/dL (ref 3.5–5.2)
ALK PHOS: 49 U/L (ref 39–117)
ALT: 15 U/L (ref 0–53)
AST: 12 U/L (ref 0–37)
BILIRUBIN TOTAL: 0.7 mg/dL (ref 0.2–1.2)
BUN: 11 mg/dL (ref 6–23)
CALCIUM: 9.1 mg/dL (ref 8.4–10.5)
CO2: 29 mEq/L (ref 19–32)
CREATININE: 1.03 mg/dL (ref 0.40–1.50)
Chloride: 104 mEq/L (ref 96–112)
GFR: 77.42 mL/min (ref >60.00)
Glucose, Bld: 97 mg/dL (ref 70–99)
Potassium: 4.7 mEq/L (ref 3.5–5.1)
SODIUM: 138 meq/L (ref 135–145)
TOTAL PROTEIN: 6.8 g/dL (ref 6.0–8.3)

## 2017-06-30 LAB — LIPID PANEL
CHOLESTEROL: 147 mg/dL (ref 0–200)
HDL: 46.4 mg/dL (ref >39.00)
LDL CALC: 82 mg/dL (ref 0–99)
NonHDL: 100.22
TRIGLYCERIDES: 92 mg/dL (ref 0.0–149.0)
Total CHOL/HDL Ratio: 3
VLDL: 18.4 mg/dL (ref 0.0–40.0)

## 2017-06-30 LAB — HEMOGLOBIN A1C: Hgb A1c MFr Bld: 6.1 % (ref 4.6–6.5)

## 2017-06-30 NOTE — Assessment & Plan Note (Signed)
BP well controlled Current regimen effective and well tolerated Continue current medications at current doses cmp  

## 2017-06-30 NOTE — Assessment & Plan Note (Signed)
Diet controlled Has lost weight and is walking daily Check A1c, lipid panel Encouraged him to continue regular exercise and ideally weight loss efforts Work on improving diet

## 2017-06-30 NOTE — Assessment & Plan Note (Signed)
Trace edema bilateral lower extremities Advised that he could wear compression socks on both legs Continue daily compression socks Continue Lasix at current dose CMP Encouraged him to continue walking daily and working on weight loss

## 2017-07-01 ENCOUNTER — Encounter: Payer: Self-pay | Admitting: Internal Medicine

## 2017-07-04 DIAGNOSIS — F319 Bipolar disorder, unspecified: Secondary | ICD-10-CM | POA: Diagnosis not present

## 2017-07-05 DIAGNOSIS — F319 Bipolar disorder, unspecified: Secondary | ICD-10-CM | POA: Diagnosis not present

## 2017-07-11 DIAGNOSIS — F319 Bipolar disorder, unspecified: Secondary | ICD-10-CM | POA: Diagnosis not present

## 2017-07-12 DIAGNOSIS — F319 Bipolar disorder, unspecified: Secondary | ICD-10-CM | POA: Diagnosis not present

## 2017-07-17 DIAGNOSIS — F319 Bipolar disorder, unspecified: Secondary | ICD-10-CM | POA: Diagnosis not present

## 2017-07-19 DIAGNOSIS — F319 Bipolar disorder, unspecified: Secondary | ICD-10-CM | POA: Diagnosis not present

## 2017-07-24 DIAGNOSIS — F319 Bipolar disorder, unspecified: Secondary | ICD-10-CM | POA: Diagnosis not present

## 2017-07-26 DIAGNOSIS — F319 Bipolar disorder, unspecified: Secondary | ICD-10-CM | POA: Diagnosis not present

## 2017-07-31 DIAGNOSIS — F319 Bipolar disorder, unspecified: Secondary | ICD-10-CM | POA: Diagnosis not present

## 2017-08-02 DIAGNOSIS — F319 Bipolar disorder, unspecified: Secondary | ICD-10-CM | POA: Diagnosis not present

## 2017-08-06 DIAGNOSIS — F319 Bipolar disorder, unspecified: Secondary | ICD-10-CM | POA: Diagnosis not present

## 2017-08-07 DIAGNOSIS — F319 Bipolar disorder, unspecified: Secondary | ICD-10-CM | POA: Diagnosis not present

## 2017-08-09 DIAGNOSIS — F319 Bipolar disorder, unspecified: Secondary | ICD-10-CM | POA: Diagnosis not present

## 2017-08-14 DIAGNOSIS — F319 Bipolar disorder, unspecified: Secondary | ICD-10-CM | POA: Diagnosis not present

## 2017-08-16 DIAGNOSIS — F319 Bipolar disorder, unspecified: Secondary | ICD-10-CM | POA: Diagnosis not present

## 2017-08-21 DIAGNOSIS — F319 Bipolar disorder, unspecified: Secondary | ICD-10-CM | POA: Diagnosis not present

## 2017-09-12 DIAGNOSIS — F319 Bipolar disorder, unspecified: Secondary | ICD-10-CM | POA: Diagnosis not present

## 2017-09-14 ENCOUNTER — Other Ambulatory Visit: Payer: Self-pay | Admitting: Internal Medicine

## 2017-09-20 DIAGNOSIS — F319 Bipolar disorder, unspecified: Secondary | ICD-10-CM | POA: Diagnosis not present

## 2017-09-22 DIAGNOSIS — F319 Bipolar disorder, unspecified: Secondary | ICD-10-CM | POA: Diagnosis not present

## 2017-09-29 DIAGNOSIS — F319 Bipolar disorder, unspecified: Secondary | ICD-10-CM | POA: Diagnosis not present

## 2017-10-04 DIAGNOSIS — F319 Bipolar disorder, unspecified: Secondary | ICD-10-CM | POA: Diagnosis not present

## 2017-10-18 ENCOUNTER — Telehealth: Payer: Federal, State, Local not specified - PPO | Admitting: Nurse Practitioner

## 2017-10-18 DIAGNOSIS — J0100 Acute maxillary sinusitis, unspecified: Secondary | ICD-10-CM

## 2017-10-18 MED ORDER — AMOXICILLIN-POT CLAVULANATE 875-125 MG PO TABS: 1.0000 | ORAL_TABLET | Freq: Two times a day (BID) | ORAL | 0 refills | Status: DC

## 2017-10-18 NOTE — Progress Notes (Signed)

## 2017-10-19 DIAGNOSIS — F319 Bipolar disorder, unspecified: Secondary | ICD-10-CM | POA: Diagnosis not present

## 2017-10-25 DIAGNOSIS — F319 Bipolar disorder, unspecified: Secondary | ICD-10-CM | POA: Diagnosis not present

## 2017-10-27 ENCOUNTER — Encounter: Payer: Self-pay | Admitting: Internal Medicine

## 2017-11-07 DIAGNOSIS — F319 Bipolar disorder, unspecified: Secondary | ICD-10-CM | POA: Diagnosis not present

## 2017-11-14 DIAGNOSIS — F319 Bipolar disorder, unspecified: Secondary | ICD-10-CM | POA: Diagnosis not present

## 2017-11-16 ENCOUNTER — Other Ambulatory Visit: Payer: Self-pay | Admitting: Internal Medicine

## 2017-11-21 DIAGNOSIS — F319 Bipolar disorder, unspecified: Secondary | ICD-10-CM | POA: Diagnosis not present

## 2017-11-23 ENCOUNTER — Encounter: Payer: Self-pay | Admitting: Internal Medicine

## 2017-11-30 DIAGNOSIS — F319 Bipolar disorder, unspecified: Secondary | ICD-10-CM | POA: Diagnosis not present

## 2017-12-06 DIAGNOSIS — F319 Bipolar disorder, unspecified: Secondary | ICD-10-CM | POA: Diagnosis not present

## 2017-12-11 ENCOUNTER — Telehealth: Payer: Self-pay | Admitting: Internal Medicine

## 2017-12-11 NOTE — Telephone Encounter (Signed)
I received a form for patient for a Athlete Medical Form - Physical Exam. After I looked at the form. The patient was last seen 06/2017. He is due for a CPE. A appointment has been made for 12/13/17 to have the form completed.

## 2017-12-12 DIAGNOSIS — F413 Other mixed anxiety disorders: Secondary | ICD-10-CM | POA: Diagnosis not present

## 2017-12-12 NOTE — Progress Notes (Signed)
Subjective:    Patient ID: Alejandro Brown, male    DOB: 1955/08/06, 63 y.o.   MRN: [id number]  HPI He is here for a physical exam.   He walks his dog two times a day.  He lives at [facility] and they provide the food, which is very good.    He has no concerns.  He wants to participate in [employer] - he golf's.  He has a form that needs to be filled out for this.   He has no concerns.  Medications and allergies reviewed with patient and updated if appropriate.  Patient Active Problem List   Diagnosis Date Noted  . Umbilical hernia 67/67/2094  . Enlarged prostate 06/12/2015  . Elevated PSA 06/12/2015  . Ventral hernia 06/12/2015  . HTN (hypertension) 08/31/2010  . Diabetes type 2, controlled ([place]) 08/31/2010  . Obesity 08/31/2010  . Mental retardation 08/31/2010  . Dependent edema 08/31/2010  . Obstructive sleep apnea 05/05/2007  . ALLERGIC RHINITIS 05/04/2007    Current Outpatient Medications on File Prior to Visit  Medication Sig Dispense Refill  . aspirin EC 81 MG tablet Take 1 tablet (81 mg total) by mouth daily. 150 tablet 2  . Calcium Polycarbophil (FIBER) 625 MG TABS Taking one daily 14 each 0  . Elastic Bandages & Supports (MEDICAL COMPRESSION SOCKS) MISC Medium compression 20-30 mm Hg for chronic leg edema 4 each 0  . furosemide (LASIX) 20 MG tablet TAKE 1 TABLET BY MOUTH EVERY DAY 90 tablet 0  . multivitamin (ONE-A-DAY MEN'S) TABS tablet Take 1 tablet by mouth daily.  0  . ramipril (ALTACE) 5 MG capsule TAKE ONE CAPSULE BY MOUTH EVERY DAY 90 capsule 1  . tamsulosin (FLOMAX) 0.4 MG CAPS capsule Take 0.4 mg by mouth daily.  0   No current facility-administered medications on file prior to visit.     Past Medical History:  Diagnosis Date  . ALLERGIC RHINITIS   . Dependent edema    chronic L>R  . GERD (gastroesophageal reflux disease)   . Hypertension   . Mental retardation    mild  . Obesity   . OBSTRUCTIVE SLEEP APNEA    noncompliant with CPAP   . Type II or unspecified type diabetes mellitus without mention of complication, not stated as uncontrolled    diet controlled    Past Surgical History:  Procedure Laterality Date  . NASAL SEPTUM SURGERY    . ROTATOR CUFF REPAIR Left 06/2014   [name]    Social History   Socioeconomic History  . Marital status: Single    Spouse name: n/a  . Number of children: 0  . Years of education: 12th grade  . Highest education level: Not on file  Occupational History  . 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 Financial resource strain: Not on file  . Food insecurity:    Worry: Not on file    Inability: Not on file  . Transportation needs:    Medical: Not on file    Non-medical: Not on file  Tobacco Use  . Smoking status: Never Smoker  . Smokeless tobacco: Never Used  Substance and Sexual Activity  . Alcohol use: No    Alcohol/week: 0.0 standard drinks  . Drug use: No  . Sexual activity: Not on file  Lifestyle  . Physical activity:    Days per week: Not on file    Minutes per session: Not on file  . Stress: Not  on file  Relationships  . Social connections:    Talks on phone: Not on file    Gets together: Not on file    Attends religious service: Not on file    Active member of club or organization: Not on file    Attends meetings of clubs or organizations: Not on file    Relationship status: Not on file  Other Topics Concern  . 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    Family History  Problem Relation Age of Onset  . Breast cancer Sister   . Breast cancer Other   . Colon cancer Neg Hx     Review of Systems  Constitutional: Negative for appetite change, chills and fever.  Eyes: Negative for visual disturbance.  Respiratory: Negative for cough, shortness of breath and wheezing.   Cardiovascular: Positive for leg swelling. Negative for chest pain and  palpitations.  Gastrointestinal: Negative for abdominal pain, blood in stool, constipation, diarrhea and nausea.       No gerd  Genitourinary: Negative for dysuria and hematuria.  Musculoskeletal: Negative for arthralgias and back pain.  Skin: Negative for color change and rash.  Neurological: Negative for weakness, light-headedness, numbness and headaches.  Psychiatric/Behavioral: Negative for dysphoric mood. The patient is not nervous/anxious.        Objective:   Vitals:   12/13/17 1045  BP: 128/84  Pulse: 85  Resp: 18  Temp: 98.5 F (36.9 C)  SpO2: 96%   Filed Weights   12/13/17 1045  Weight: 245 lb 12.8 oz (111.5 kg)   Body mass index is 42.19 kg/m.  Wt Readings from Last 3 Encounters:  12/13/17 245 lb 12.8 oz (111.5 kg)  06/30/17 240 lb (108.9 kg)  12/16/16 247 lb (112 kg)     Physical Exam Constitutional: He appears well-developed and well-nourished. No distress.  HENT:  Head: Normocephalic and atraumatic.  Right Ear: External ear normal.  Left Ear: External ear normal.  Mouth/Throat: Oropharynx is clear and moist.  Normal ear canals and TM b/l  Eyes: Conjunctivae and EOM are normal.  Neck: Neck supple. No tracheal deviation present. No thyromegaly present.  No carotid bruit  Cardiovascular: Normal rate, regular rhythm, normal heart sounds and intact distal pulses.  No murmur heard.  No edema right lower extremity but is nonpitting.  Right ankle with small varicosities.  Left lower leg with compression sock-degree of edema not able to be evaluated Pulmonary/Chest: Effort normal and breath sounds normal. No respiratory distress. He has no wheezes. He has no rales.  Abdominal: Soft. He exhibits no distension. There is no tenderness.  Umbilical and ventral hernia-both nontender and reducible Genitourinary: deferred  Lymphadenopathy:   He has no cervical adenopathy.  Skin: Skin is warm and dry. He is not diaphoretic.  Psychiatric: He has a normal mood and affect.  His behavior is normal.         Assessment & Plan:   Physical exam: Screening blood work  ordered Immunizations   Flu vaccine today, shingrix discussed Colonoscopy   Up to date  Eye exams   Up to date  EKG    Done 05/2016 Exercise he walks his dog twice a day-encouraged increasing this if possible Weight   stressed importance of weight loss for multiple reasons-discussed increasing his exercise and decreasing portions Skin   no concerns Substance abuse    none  See Problem  List for Assessment and Plan of chronic medical problems.

## 2017-12-12 NOTE — Patient Instructions (Addendum)
Tests ordered today. Your results will be released to [place] (or called to you) after review, usually within 72hours after test completion. If any changes need to be made, you will be notified at that same time.  All other Health Maintenance issues reviewed.   All recommended immunizations and age-appropriate screenings are up-to-date or discussed.  Flu immunization administered today.    Medications reviewed and updated.  Changes include :   none   Please followup in 6 months   Health Maintenance, Male A healthy lifestyle and preventive care is important for your health and wellness. Ask your health care provider about what schedule of regular examinations is right for you. What should I know about weight and diet? Eat a Healthy Diet  Eat plenty of vegetables, fruits, whole grains, low-fat dairy products, and lean protein.  Do not eat a lot of foods high in solid fats, added sugars, or salt.  Maintain a Healthy Weight Regular exercise can help you achieve or maintain a healthy weight. You should:  Do at least 150 minutes of exercise each week. The exercise should increase your heart rate and make you sweat (moderate-intensity exercise).  Do strength-training exercises at least twice a week.  Watch Your Levels of Cholesterol and Blood Lipids  Have your blood tested for lipids and cholesterol every 5 years starting at 64 years of age. If you are at high risk for heart disease, you should start having your blood tested when you are 64 years old. You may need to have your cholesterol levels checked more often if: ? Your lipid or cholesterol levels are high. ? You are older than 64 years of age. ? You are at high risk for heart disease.  What should I know about cancer screening? Many types of cancers can be detected early and may often be prevented. Lung Cancer  You should be screened every year for lung cancer if: ? You are a current smoker who has smoked for at least 30  years. ? You are a former smoker who has quit within the past 15 years.  Talk to your health care provider about your screening options, when you should start screening, and how often you should be screened.  Colorectal Cancer  Routine colorectal cancer screening usually begins at 64 years of age and should be repeated every 5-10 years until you are 64 years old. You may need to be screened more often if early forms of precancerous polyps or small growths are found. Your health care provider may recommend screening at an earlier age if you have risk factors for colon cancer.  Your health care provider may recommend using home test kits to check for hidden blood in the stool.  A small camera at the end of a tube can be used to examine your colon (sigmoidoscopy or colonoscopy). This checks for the earliest forms of colorectal cancer.  Prostate and Testicular Cancer  Depending on your age and overall health, your health care provider may do certain tests to screen for prostate and testicular cancer.  Talk to your health care provider about any symptoms or concerns you have about testicular or prostate cancer.  Skin Cancer  Check your skin from head to toe regularly.  Tell your health care provider about any new moles or changes in moles, especially if: ? There is a change in a mole's size, shape, or color. ? You have a mole that is larger than a pencil eraser.  Always use sunscreen. Apply sunscreen liberally  liberally and repeat throughout the day.  Protect yourself by wearing long sleeves, pants, a wide-brimmed hat, and sunglasses when outside.  What should I know about heart disease, diabetes, and high blood pressure?  If you are 18-39 years of age, have your blood pressure checked every 3-5 years. If you are 40 years of age or older, have your blood pressure checked every year. You should have your blood pressure measured twice-once when you are at a hospital or clinic, and once when you are  not at a hospital or clinic. Record the average of the two measurements. To check your blood pressure when you are not at a hospital or clinic, you can use: ? An automated blood pressure machine at a pharmacy. ? A home blood pressure monitor.  Talk to your health care provider about your target blood pressure.  If you are between 45-79 years old, ask your health care provider if you should take aspirin to prevent heart disease.  Have regular diabetes screenings by checking your fasting blood sugar level. ? If you are at a normal weight and have a low risk for diabetes, have this test once every three years after the age of 45. ? If you are overweight and have a high risk for diabetes, consider being tested at a younger age or more often.  A one-time screening for abdominal aortic aneurysm (AAA) by ultrasound is recommended for men aged 65-75 years who are current or former smokers. What should I know about preventing infection? Hepatitis B If you have a higher risk for hepatitis B, you should be screened for this virus. Talk with your health care provider to find out if you are at risk for hepatitis B infection. Hepatitis C Blood testing is recommended for:  Everyone born from 1945 through 1965.  Anyone with known risk factors for hepatitis C.  Sexually Transmitted Diseases (STDs)  You should be screened each year for STDs including gonorrhea and chlamydia if: ? You are sexually active and are younger than 64 years of age. ? You are older than 64 years of age and your health care provider tells you that you are at risk for this type of infection. ? Your sexual activity has changed since you were last screened and you are at an increased risk for chlamydia or gonorrhea. Ask your health care provider if you are at risk.  Talk with your health care provider about whether you are at high risk of being infected with HIV. Your health care provider may recommend a prescription medicine to help  prevent HIV infection.  What else can I do?  Schedule regular health, dental, and eye exams.  Stay current with your vaccines (immunizations).  Do not use any tobacco products, such as cigarettes, chewing tobacco, and e-cigarettes. If you need help quitting, ask your health care provider.  Limit alcohol intake to no more than 2 drinks per day. One drink equals 12 ounces of beer, 5 ounces of wine, or 1 ounces of hard liquor.  Do not use street drugs.  Do not share needles.  Ask your health care provider for help if you need support or information about quitting drugs.  Tell your health care provider if you often feel depressed.  Tell your health care provider if you have ever been abused or do not feel safe at home. This information is not intended to replace advice given to you by your health care provider. Make sure you discuss any questions you have with your   care provider. Document Released: 09/03/2007 Document Revised: 11/04/2015 Document Reviewed: 12/09/2014 Elsevier Interactive Patient Education  2018 Elsevier Inc.  

## 2017-12-13 ENCOUNTER — Other Ambulatory Visit (INDEPENDENT_AMBULATORY_CARE_PROVIDER_SITE_OTHER): Payer: Federal, State, Local not specified - PPO

## 2017-12-13 ENCOUNTER — Encounter: Payer: Self-pay | Admitting: Internal Medicine

## 2017-12-13 ENCOUNTER — Ambulatory Visit (INDEPENDENT_AMBULATORY_CARE_PROVIDER_SITE_OTHER): Payer: Federal, State, Local not specified - PPO | Admitting: Internal Medicine

## 2017-12-13 VITALS — BP 128/84 | HR 85 | Temp 98.5°F | Resp 18 | Ht 64.0 in | Wt 245.8 lb

## 2017-12-13 DIAGNOSIS — Z23 Encounter for immunization: Secondary | ICD-10-CM | POA: Diagnosis not present

## 2017-12-13 DIAGNOSIS — I10 Essential (primary) hypertension: Secondary | ICD-10-CM | POA: Diagnosis not present

## 2017-12-13 DIAGNOSIS — Z6841 Body Mass Index (BMI) 40.0 and over, adult: Secondary | ICD-10-CM

## 2017-12-13 DIAGNOSIS — E119 Type 2 diabetes mellitus without complications: Secondary | ICD-10-CM | POA: Diagnosis not present

## 2017-12-13 DIAGNOSIS — R609 Edema, unspecified: Secondary | ICD-10-CM

## 2017-12-13 DIAGNOSIS — K439 Ventral hernia without obstruction or gangrene: Secondary | ICD-10-CM

## 2017-12-13 DIAGNOSIS — Z0000 Encounter for general adult medical examination without abnormal findings: Secondary | ICD-10-CM | POA: Diagnosis not present

## 2017-12-13 DIAGNOSIS — N400 Enlarged prostate without lower urinary tract symptoms: Secondary | ICD-10-CM

## 2017-12-13 DIAGNOSIS — K429 Umbilical hernia without obstruction or gangrene: Secondary | ICD-10-CM

## 2017-12-13 LAB — LIPID PANEL
CHOLESTEROL: 159 mg/dL (ref 0–200)
HDL: 38.9 mg/dL — ABNORMAL LOW (ref >39.00)
LDL CALC: 81 mg/dL (ref 0–99)
NonHDL: 120.05
TRIGLYCERIDES: 194 mg/dL — AB (ref 0.0–149.0)
Total CHOL/HDL Ratio: 4
VLDL: 38.8 mg/dL (ref 0.0–40.0)

## 2017-12-13 LAB — COMPREHENSIVE METABOLIC PANEL
ALK PHOS: 50 U/L (ref 39–117)
ALT: 17 U/L (ref 0–53)
AST: 12 U/L (ref 0–37)
Albumin: 4 g/dL (ref 3.5–5.2)
BILIRUBIN TOTAL: 0.6 mg/dL (ref 0.2–1.2)
BUN: 17 mg/dL (ref 6–23)
CALCIUM: 9.2 mg/dL (ref 8.4–10.5)
CO2: 29 mEq/L (ref 19–32)
CREATININE: 1.04 mg/dL (ref 0.40–1.50)
Chloride: 103 mEq/L (ref 96–112)
GFR: 76.45 mL/min (ref >60.00)
Glucose, Bld: 98 mg/dL (ref 70–99)
Potassium: 4.2 mEq/L (ref 3.5–5.1)
SODIUM: 140 meq/L (ref 135–145)
Total Protein: 6.5 g/dL (ref 6.0–8.3)

## 2017-12-13 LAB — CBC WITH DIFFERENTIAL/PLATELET
BASOS ABS: 0.1 10*3/uL (ref 0.0–0.1)
Basophils Relative: 1.2 % (ref 0.0–3.0)
EOS ABS: 0.3 10*3/uL (ref 0.0–0.7)
Eosinophils Relative: 3.2 % (ref 0.0–5.0)
HCT: 43.9 % (ref 39.0–52.0)
Hemoglobin: 15 g/dL (ref 13.0–17.0)
LYMPHS PCT: 25.9 % (ref 12.0–46.0)
Lymphs Abs: 2.1 10*3/uL (ref 0.7–4.0)
MCHC: 34.2 g/dL (ref 30.0–36.0)
MCV: 89.3 fl (ref 78.0–100.0)
Monocytes Absolute: 0.8 10*3/uL (ref 0.1–1.0)
Monocytes Relative: 10.1 % (ref 3.0–12.0)
NEUTROS PCT: 59.6 % (ref 43.0–77.0)
Neutro Abs: 4.8 10*3/uL (ref 1.4–7.7)
PLATELETS: 241 10*3/uL (ref 150.0–400.0)
RBC: 4.91 Mil/uL (ref 4.22–5.81)
RDW: 12.3 % (ref 11.5–15.5)
WBC: 8.1 10*3/uL (ref 4.0–10.5)

## 2017-12-13 LAB — TSH: TSH: 1.53 u[IU]/mL (ref 0.35–4.50)

## 2017-12-13 LAB — HEMOGLOBIN A1C: Hgb A1c MFr Bld: 6.3 % (ref 4.6–6.5)

## 2017-12-13 NOTE — Assessment & Plan Note (Signed)
Chronic left lower extremity edema more than right Wears compression hose on left leg only.  Discussed that he could try using the compression hose on the right leg as well, but that leg is less flexible and more difficult to get the sock on Continue Lasix daily CMP Encouraged weight loss

## 2017-12-13 NOTE — Assessment & Plan Note (Signed)
Following with urology Taking Flomax 

## 2017-12-13 NOTE — Assessment & Plan Note (Signed)
Discussed the importance of weight loss Encouraged increasing his walking and decreasing portion size

## 2017-12-13 NOTE — Assessment & Plan Note (Signed)
Asymptomatic-nontender, reducible Monitor

## 2017-12-13 NOTE — Assessment & Plan Note (Signed)
BP well controlled Current regimen effective and well tolerated Continue current medications at current doses cmp  

## 2017-12-13 NOTE — Assessment & Plan Note (Signed)
Diet controlled Check A1c Encouraged increasing his exercise and working on weight loss Follow-up in 6 months

## 2017-12-14 ENCOUNTER — Encounter: Payer: Self-pay | Admitting: Internal Medicine

## 2017-12-19 DIAGNOSIS — F319 Bipolar disorder, unspecified: Secondary | ICD-10-CM | POA: Diagnosis not present

## 2017-12-19 NOTE — Telephone Encounter (Signed)
Form was completed by provider on 9/25 during the appointment.

## 2017-12-26 DIAGNOSIS — F319 Bipolar disorder, unspecified: Secondary | ICD-10-CM | POA: Diagnosis not present

## 2017-12-28 DIAGNOSIS — F319 Bipolar disorder, unspecified: Secondary | ICD-10-CM | POA: Diagnosis not present

## 2018-01-02 DIAGNOSIS — F319 Bipolar disorder, unspecified: Secondary | ICD-10-CM | POA: Diagnosis not present

## 2018-01-04 ENCOUNTER — Encounter: Payer: Self-pay | Admitting: Internal Medicine

## 2018-01-04 ENCOUNTER — Ambulatory Visit (AMBULATORY_SURGERY_CENTER): Payer: Self-pay

## 2018-01-04 VITALS — Ht 65.0 in | Wt 244.6 lb

## 2018-01-04 DIAGNOSIS — Z86010 Personal history of colonic polyps: Secondary | ICD-10-CM

## 2018-01-04 MED ORDER — NA SULFATE-K SULFATE-MG SULF 17.5-3.13-1.6 GM/177ML PO SOLN: 1.0000 | Freq: Once | ORAL | 0 refills | Status: AC

## 2018-01-04 NOTE — Progress Notes (Signed)
Denies allergies to eggs or soy products. Denies complication of anesthesia or sedation. Denies use of weight loss medication. Denies use of O2.   Emmi instructions declined.  

## 2018-01-05 ENCOUNTER — Ambulatory Visit: Payer: Federal, State, Local not specified - PPO | Admitting: Internal Medicine

## 2018-01-07 DIAGNOSIS — F319 Bipolar disorder, unspecified: Secondary | ICD-10-CM | POA: Diagnosis not present

## 2018-01-12 DIAGNOSIS — F319 Bipolar disorder, unspecified: Secondary | ICD-10-CM | POA: Diagnosis not present

## 2018-01-12 DIAGNOSIS — F329 Major depressive disorder, single episode, unspecified: Secondary | ICD-10-CM | POA: Diagnosis not present

## 2018-01-15 ENCOUNTER — Encounter: Payer: Self-pay | Admitting: Internal Medicine

## 2018-01-15 ENCOUNTER — Ambulatory Visit (AMBULATORY_SURGERY_CENTER): Payer: Federal, State, Local not specified - PPO | Admitting: Internal Medicine

## 2018-01-15 VITALS — BP 125/62 | HR 79 | Temp 98.7°F | Resp 15 | Ht 65.0 in | Wt 244.0 lb

## 2018-01-15 DIAGNOSIS — Z86010 Personal history of colonic polyps: Secondary | ICD-10-CM | POA: Diagnosis not present

## 2018-01-15 DIAGNOSIS — D120 Benign neoplasm of cecum: Secondary | ICD-10-CM

## 2018-01-15 DIAGNOSIS — Z1211 Encounter for screening for malignant neoplasm of colon: Secondary | ICD-10-CM | POA: Diagnosis not present

## 2018-01-15 DIAGNOSIS — D123 Benign neoplasm of transverse colon: Secondary | ICD-10-CM

## 2018-01-15 DIAGNOSIS — D122 Benign neoplasm of ascending colon: Secondary | ICD-10-CM | POA: Diagnosis not present

## 2018-01-15 MED ORDER — SODIUM CHLORIDE 0.9 % IV SOLN: 500.0000 mL | Freq: Once | INTRAVENOUS | Status: DC

## 2018-01-15 NOTE — Progress Notes (Signed)
No problems noted in the recovery room. Maw  Pt's care partner is his sister-in-law.  I offered to assisted pt with dressing and pt declined.  I told him to call me if needed.

## 2018-01-15 NOTE — Progress Notes (Signed)
PT taken to PACU. Monitors in place. VSS. Report given to RN. 

## 2018-01-15 NOTE — Progress Notes (Signed)
Patient's sister in law at pt's bedside helping answer questions.

## 2018-01-15 NOTE — Progress Notes (Signed)
Called to room to assist during endoscopic procedure.  Patient ID and intended procedure confirmed with present staff. Received instructions for my participation in the procedure from the performing physician.  

## 2018-01-15 NOTE — Op Note (Signed)
[place] Patient Name: Alejandro Brown Procedure Date: 01/15/2018 3:36 PM MRN: [id number] Endoscopist: Jerene Bears , MD Age: 62 Referring MD:  Date of Birth: 08-16-1955 Gender: Male Account #: [id number] Procedure:                Colonoscopy Indications:              Surveillance: Personal history of adenomatous                            polyps on last colonoscopy 5 years ago Medicines:                Monitored Anesthesia Care Procedure:                Pre-Anesthesia Assessment:                           - Prior to the procedure, a History and Physical                            was performed, and patient medications and                            allergies were reviewed. The patient's tolerance of                            previous anesthesia was also reviewed. The risks                            and benefits of the procedure and the sedation                            options and risks were discussed with the patient.                            All questions were answered, and informed consent                            was obtained. Prior Anticoagulants: The patient has                            taken no previous anticoagulant or antiplatelet                            agents. ASA Grade Assessment: III - A patient with                            severe systemic disease. After reviewing the risks                            and benefits, the patient was deemed in                            satisfactory condition to undergo the procedure.  After obtaining informed consent, the colonoscope                            was passed under direct vision. Throughout the                            procedure, the patient's blood pressure, pulse, and                            oxygen saturations were monitored continuously. The                            Colonoscope was introduced through the anus and                            advanced to the cecum,  identified by appendiceal                            orifice and ileocecal valve. The colonoscopy was                            performed without difficulty. The patient tolerated                            the procedure well. The quality of the bowel                            preparation was good. The ileocecal valve,                            appendiceal orifice, and rectum were photographed. Scope In: 3:59:48 PM Scope Out: 4:12:49 PM Scope Withdrawal Time: 0 hours 10 minutes 51 seconds  Total Procedure Duration: 0 hours 13 minutes 1 second  Findings:                 The digital rectal exam was normal.                           A 2 mm polyp was found in the cecum. The polyp was                            sessile. The polyp was removed with a cold biopsy                            forceps. Resection and retrieval were complete.                           A 3 mm polyp was found in the ascending colon. The                            polyp was sessile. The polyp was removed with a  cold biopsy forceps. Resection and retrieval were                            complete.                           A 5 mm polyp was found in the proximal transverse                            colon. The polyp was sessile. The polyp was removed                            with a cold snare. Resection and retrieval were                            complete.                           Multiple small-mouthed diverticula were found in                            the sigmoid colon and ascending colon.                           The retroflexed view of the distal rectum and anal                            verge was normal and showed no anal or rectal                            abnormalities. Complications:            No immediate complications. Estimated Blood Loss:     Estimated blood loss was minimal. Impression:               - One 2 mm polyp in the cecum, removed with a cold                             biopsy forceps. Resected and retrieved.                           - One 3 mm polyp in the ascending colon, removed                            with a cold biopsy forceps. Resected and retrieved.                           - One 5 mm polyp in the proximal transverse colon,                            removed with a cold snare. Resected and retrieved.                           - Diverticulosis in the sigmoid colon and in the  ascending colon.                           - The distal rectum and anal verge are normal on                            retroflexion view. Recommendation:           - Patient has a contact number available for                            emergencies. The signs and symptoms of potential                            delayed complications were discussed with the                            patient. Return to normal activities tomorrow.                            Written discharge instructions were provided to the                            patient.                           - Resume previous diet.                           - Continue present medications.                           - Await pathology results.                           - Repeat colonoscopy is recommended for                            surveillance. The colonoscopy date will be                            determined after pathology results from today's                            exam become available for review. Jerene Bears, MD 01/15/2018 4:17:02 PM This report has been signed electronically.

## 2018-01-15 NOTE — Patient Instructions (Signed)
YOU HAD AN ENDOSCOPIC PROCEDURE TODAY AT [facility]:   Refer to the procedure report that was given to you for any specific questions about what was found during the examination.  If the procedure report does not answer your questions, please call your gastroenterologist to clarify.  If you requested that your care partner not be given the details of your procedure findings, then the procedure report has been included in a sealed envelope for you to review at your convenience later.  YOU SHOULD EXPECT: Some feelings of bloating in the abdomen. Passage of more gas than usual.  Walking can help get rid of the air that was put into your GI tract during the procedure and reduce the bloating. If you had a lower endoscopy (such as a colonoscopy or flexible sigmoidoscopy) you may notice spotting of blood in your stool or on the toilet paper. If you underwent a bowel prep for your procedure, you may not have a normal bowel movement for a few days.  Please Note:  You might notice some irritation and congestion in your nose or some drainage.  This is from the oxygen used during your procedure.  There is no need for concern and it should clear up in a day or so.  SYMPTOMS TO REPORT IMMEDIATELY:   Following lower endoscopy (colonoscopy or flexible sigmoidoscopy):  Excessive amounts of blood in the stool  Significant tenderness or worsening of abdominal pains  Swelling of the abdomen that is new, acute  Fever of 100F or higher   Following upper endoscopy (EGD)  Vomiting of blood or coffee ground material  New chest pain or pain under the shoulder blades  Painful or persistently difficult swallowing  New shortness of breath  Fever of 100F or higher  Black, tarry-looking stools  For urgent or emergent issues, a gastroenterologist can be reached at any hour by calling [phone].   DIET:  We do recommend a small meal at first, but then you may proceed to your regular diet.  Drink  plenty of fluids but you should avoid alcoholic beverages for 24 hours.  ACTIVITY:  You should plan to take it easy for the rest of today and you should NOT DRIVE or use heavy machinery until tomorrow (because of the sedation medicines used during the test).    FOLLOW UP: Our staff will call the number listed on your records the next business day following your procedure to check on you and address any questions or concerns that you may have regarding the information given to you following your procedure. If we do not reach you, we will leave a message.  However, if you are feeling well and you are not experiencing any problems, there is no need to return our call.  We will assume that you have returned to your regular daily activities without incident.  If any biopsies were taken you will be contacted by phone or by letter within the next 1-3 weeks.  Please call us at [phone] if you have not heard about the biopsies in 3 weeks.    SIGNATURES/CONFIDENTIALITY: You and/or your care partner have signed paperwork which will be entered into your electronic medical record.  These signatures attest to the fact that that the information above on your After Visit Summary has been reviewed and is understood.  Full responsibility of the confidentiality of this discharge information lies with you and/or your care-partner.   Handouts were given to your care partner on polyps and  diverticulosis. You may resume your current medications today. Await biopsy results. Please call if any questions or concerns.

## 2018-01-16 ENCOUNTER — Telehealth: Payer: Self-pay | Admitting: *Deleted

## 2018-01-16 NOTE — Telephone Encounter (Signed)
  Follow up Call-  Call back number 01/15/2018  Post procedure Call Back phone  # [phone]  Permission to leave phone message Yes  Some recent data might be hidden     Patient questions:  Do you have a fever, pain , or abdominal swelling? No. Pain Score  0 *  Have you tolerated food without any problems? Yes.    Have you been able to return to your normal activities? Yes.    Do you have any questions about your discharge instructions: Diet   No. Medications  No. Follow up visit  No.  Do you have questions or concerns about your Care? No.  Actions: * If pain score is 4 or above: No action needed, pain <4.

## 2018-01-22 ENCOUNTER — Encounter: Payer: Self-pay | Admitting: Internal Medicine

## 2018-01-30 DIAGNOSIS — F319 Bipolar disorder, unspecified: Secondary | ICD-10-CM | POA: Diagnosis not present

## 2018-02-02 DIAGNOSIS — R972 Elevated prostate specific antigen [PSA]: Secondary | ICD-10-CM | POA: Diagnosis not present

## 2018-02-06 DIAGNOSIS — F319 Bipolar disorder, unspecified: Secondary | ICD-10-CM | POA: Diagnosis not present

## 2018-02-09 DIAGNOSIS — N400 Enlarged prostate without lower urinary tract symptoms: Secondary | ICD-10-CM | POA: Diagnosis not present

## 2018-02-13 DIAGNOSIS — F319 Bipolar disorder, unspecified: Secondary | ICD-10-CM | POA: Diagnosis not present

## 2018-02-14 ENCOUNTER — Other Ambulatory Visit: Payer: Self-pay | Admitting: Internal Medicine

## 2018-02-19 DIAGNOSIS — F319 Bipolar disorder, unspecified: Secondary | ICD-10-CM | POA: Diagnosis not present

## 2018-03-05 DIAGNOSIS — F319 Bipolar disorder, unspecified: Secondary | ICD-10-CM | POA: Diagnosis not present

## 2018-03-08 ENCOUNTER — Other Ambulatory Visit: Payer: Self-pay | Admitting: Internal Medicine

## 2018-03-15 DIAGNOSIS — F319 Bipolar disorder, unspecified: Secondary | ICD-10-CM | POA: Diagnosis not present

## 2018-03-16 DIAGNOSIS — E119 Type 2 diabetes mellitus without complications: Secondary | ICD-10-CM | POA: Diagnosis not present

## 2018-03-16 DIAGNOSIS — H2513 Age-related nuclear cataract, bilateral: Secondary | ICD-10-CM | POA: Diagnosis not present

## 2018-03-16 DIAGNOSIS — H52203 Unspecified astigmatism, bilateral: Secondary | ICD-10-CM | POA: Diagnosis not present

## 2018-03-16 LAB — HM DIABETES EYE EXAM

## 2018-03-17 DIAGNOSIS — F319 Bipolar disorder, unspecified: Secondary | ICD-10-CM | POA: Diagnosis not present

## 2018-03-20 DIAGNOSIS — F319 Bipolar disorder, unspecified: Secondary | ICD-10-CM | POA: Diagnosis not present

## 2018-03-27 DIAGNOSIS — F319 Bipolar disorder, unspecified: Secondary | ICD-10-CM | POA: Diagnosis not present

## 2018-04-02 DIAGNOSIS — F319 Bipolar disorder, unspecified: Secondary | ICD-10-CM | POA: Diagnosis not present

## 2018-04-05 ENCOUNTER — Encounter: Payer: Self-pay | Admitting: Internal Medicine

## 2018-04-05 NOTE — Progress Notes (Signed)
Abstracted and sent to scan  

## 2018-04-17 DIAGNOSIS — F319 Bipolar disorder, unspecified: Secondary | ICD-10-CM | POA: Diagnosis not present

## 2018-04-18 DIAGNOSIS — F319 Bipolar disorder, unspecified: Secondary | ICD-10-CM | POA: Diagnosis not present

## 2018-04-24 DIAGNOSIS — F319 Bipolar disorder, unspecified: Secondary | ICD-10-CM | POA: Diagnosis not present

## 2018-05-03 DIAGNOSIS — F319 Bipolar disorder, unspecified: Secondary | ICD-10-CM | POA: Diagnosis not present

## 2018-05-08 DIAGNOSIS — F319 Bipolar disorder, unspecified: Secondary | ICD-10-CM | POA: Diagnosis not present

## 2018-05-15 DIAGNOSIS — F319 Bipolar disorder, unspecified: Secondary | ICD-10-CM | POA: Diagnosis not present

## 2018-05-17 ENCOUNTER — Other Ambulatory Visit: Payer: Self-pay | Admitting: Internal Medicine

## 2018-05-22 DIAGNOSIS — F319 Bipolar disorder, unspecified: Secondary | ICD-10-CM | POA: Diagnosis not present

## 2018-05-29 DIAGNOSIS — F319 Bipolar disorder, unspecified: Secondary | ICD-10-CM | POA: Diagnosis not present

## 2018-06-05 DIAGNOSIS — F319 Bipolar disorder, unspecified: Secondary | ICD-10-CM | POA: Diagnosis not present

## 2018-06-13 ENCOUNTER — Ambulatory Visit: Payer: Federal, State, Local not specified - PPO | Admitting: Internal Medicine

## 2018-06-15 DIAGNOSIS — F319 Bipolar disorder, unspecified: Secondary | ICD-10-CM | POA: Diagnosis not present

## 2018-06-21 DIAGNOSIS — F319 Bipolar disorder, unspecified: Secondary | ICD-10-CM | POA: Diagnosis not present

## 2018-06-26 DIAGNOSIS — F319 Bipolar disorder, unspecified: Secondary | ICD-10-CM | POA: Diagnosis not present

## 2018-06-27 DIAGNOSIS — F319 Bipolar disorder, unspecified: Secondary | ICD-10-CM | POA: Diagnosis not present

## 2018-07-03 DIAGNOSIS — F319 Bipolar disorder, unspecified: Secondary | ICD-10-CM | POA: Diagnosis not present

## 2018-07-06 ENCOUNTER — Telehealth: Payer: Self-pay | Admitting: Internal Medicine

## 2018-07-06 NOTE — Telephone Encounter (Signed)
Patient scheduled for D[facility] on Monday.

## 2018-07-06 NOTE — Telephone Encounter (Signed)
Copied from [place] [id number]. Topic: Appointment Scheduling - Scheduling Inquiry for Clinic >> Jul 06, 2018  3:35 PM Rayann Heman wrote: Reason for CRM: Anderson Malta RN calling with white Alejandro Brown called and stated that the patient is having right shoulder pain and would like a cal back about scheduling. Pt recived injury 2 months ago after falling in yard. Anderson Malta is requesting x-ray . She can be reached until for today 07/06/18. Please advise

## 2018-07-07 NOTE — Progress Notes (Signed)
Error - unable to do virtual   This encounter was created in error - please disregard.

## 2018-07-09 ENCOUNTER — Encounter: Payer: Federal, State, Local not specified - PPO | Admitting: Internal Medicine

## 2018-07-09 ENCOUNTER — Ambulatory Visit (INDEPENDENT_AMBULATORY_CARE_PROVIDER_SITE_OTHER): Payer: Federal, State, Local not specified - PPO | Admitting: Internal Medicine

## 2018-07-09 ENCOUNTER — Encounter: Payer: Self-pay | Admitting: Internal Medicine

## 2018-07-09 DIAGNOSIS — M25511 Pain in right shoulder: Secondary | ICD-10-CM

## 2018-07-09 NOTE — Assessment & Plan Note (Signed)
Pain started 2 months ago after falling on right shoulder Pain fairly constant, increased pain with certain movements, dec ROM Will get mobile x-ray at [facility] where he lives Tylenol prn, advil prn - but advised to be careful of stomach Ice Topical arthritis medications Revise activities Can have him see sports medicine once coronavirus situation has resolved for [place]

## 2018-07-09 NOTE — Progress Notes (Signed)
Virtual Visit via Video Note  I connected with Alejandro Brown on 07/09/18 at 10:00 AM EDT by a video enabled telemedicine application and verified that I am speaking with the correct person using two identifiers.   I discussed the limitations of evaluation and management by telemedicine and the availability of in person appointments. The patient expressed understanding and agreed to proceed.  The patient is currently at home and I am in the office.    No referring provider.    History of Present Illness: This is an acute visit for right shoulder pain.  2 months ago he fell and injured his right shoulder.  He was walking his dog and it was slippery out and he slipped and fell on the right shoulder.  It was bruised at that time.  He continues to have pain that he describes as fairly constant-he states he feels the pain every 5 minutes.  There is been no improvement in the pain.  He does have some decreased range of motion of the shoulder.  He has difficulty reaching behind his head.  With certain movements he does have increased pain, such as reaching as high as he can and and when his dog pulls him when he is walking him.  There is no hand weakness or numbness/tingling.  He has not taken anything for the pain.  He wonders about getting x-ray.   Social History   Socioeconomic History  . Marital status: Single    Spouse name: n/a  . Number of children: 0  . Years of education: 12th grade  . Highest education level: Not on file  Occupational History  . 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 Financial resource strain: Not on file  . Food insecurity:    Worry: Not on file    Inability: Not on file  . Transportation needs:    Medical: Not on file    Non-medical: Not on file  Tobacco Use  . Smoking status: Never Smoker  . Smokeless tobacco: Never Used  Substance and Sexual Activity  . Alcohol use: No    Alcohol/week: 0.0 standard drinks  . Drug use:  No  . Sexual activity: Not on file  Lifestyle  . Physical activity:    Days per week: Not on file    Minutes per session: Not on file  . Stress: Not on file  Relationships  . Social connections:    Talks on phone: Not on file    Gets together: Not on file    Attends religious service: Not on file    Active member of club or organization: Not on file    Attends meetings of clubs or organizations: Not on file    Relationship status: Not on file  Other Topics Concern  . 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     Observations/Objective: Appears well in NAD   Assessment and Plan:  See Problem List for Assessment and Plan of chronic medical problems.   Follow Up Instructions:    I discussed the assessment and treatment plan with the patient. The patient was provided an opportunity to ask questions and all were answered. The patient agreed with the plan and demonstrated an understanding of the instructions.   The patient was advised to call back or seek an in-person evaluation if the symptoms worsen or if the condition fails  to improve as anticipated.    Binnie Rail, MD

## 2018-07-10 DIAGNOSIS — M25511 Pain in right shoulder: Secondary | ICD-10-CM | POA: Diagnosis not present

## 2018-07-10 DIAGNOSIS — F319 Bipolar disorder, unspecified: Secondary | ICD-10-CM | POA: Diagnosis not present

## 2018-07-12 ENCOUNTER — Telehealth: Payer: Self-pay | Admitting: Internal Medicine

## 2018-07-12 NOTE — Telephone Encounter (Signed)
Please let him know the xray of his shoulder showed no fracture.  He most likely has a tear of his rotator cuff.  Once the coronavirus situation has resolved if he is still having symptoms I can refer him to sports medicine for further treatment.

## 2018-07-12 NOTE — Telephone Encounter (Signed)
I called pt- spoke to [place] (listed on DPR) and informed her of below. She verbalized understanding.

## 2018-07-17 DIAGNOSIS — F319 Bipolar disorder, unspecified: Secondary | ICD-10-CM | POA: Diagnosis not present

## 2018-07-19 ENCOUNTER — Other Ambulatory Visit: Payer: Self-pay

## 2018-07-19 ENCOUNTER — Encounter (HOSPITAL_COMMUNITY): Payer: Self-pay

## 2018-07-19 ENCOUNTER — Emergency Department (HOSPITAL_COMMUNITY): Payer: Federal, State, Local not specified - PPO

## 2018-07-19 ENCOUNTER — Emergency Department (HOSPITAL_COMMUNITY)
Admission: EM | Admit: 2018-07-19 | Discharge: 2018-07-19 | Disposition: A | Discharge status: Home / Self Care | Payer: Federal, State, Local not specified - PPO | Attending: Emergency Medicine | Admitting: Emergency Medicine

## 2018-07-19 DIAGNOSIS — W010XXA Fall on same level from slipping, tripping and stumbling without subsequent striking against object, initial encounter: Secondary | ICD-10-CM | POA: Insufficient documentation

## 2018-07-19 DIAGNOSIS — Z79899 Other long term (current) drug therapy: Secondary | ICD-10-CM | POA: Diagnosis not present

## 2018-07-19 DIAGNOSIS — Y939 Activity, unspecified: Secondary | ICD-10-CM | POA: Diagnosis not present

## 2018-07-19 DIAGNOSIS — Z7982 Long term (current) use of aspirin: Secondary | ICD-10-CM | POA: Diagnosis not present

## 2018-07-19 DIAGNOSIS — R0902 Hypoxemia: Secondary | ICD-10-CM | POA: Diagnosis not present

## 2018-07-19 DIAGNOSIS — E119 Type 2 diabetes mellitus without complications: Secondary | ICD-10-CM | POA: Insufficient documentation

## 2018-07-19 DIAGNOSIS — S8392XA Sprain of unspecified site of left knee, initial encounter: Secondary | ICD-10-CM

## 2018-07-19 DIAGNOSIS — Y92002 Bathroom of unspecified non-institutional (private) residence single-family (private) house as the place of occurrence of the external cause: Secondary | ICD-10-CM | POA: Diagnosis not present

## 2018-07-19 DIAGNOSIS — Y999 Unspecified external cause status: Secondary | ICD-10-CM | POA: Diagnosis not present

## 2018-07-19 DIAGNOSIS — F70 Mild intellectual disabilities: Secondary | ICD-10-CM | POA: Insufficient documentation

## 2018-07-19 DIAGNOSIS — I10 Essential (primary) hypertension: Secondary | ICD-10-CM | POA: Diagnosis not present

## 2018-07-19 DIAGNOSIS — S8992XA Unspecified injury of left lower leg, initial encounter: Secondary | ICD-10-CM | POA: Diagnosis not present

## 2018-07-19 DIAGNOSIS — R52 Pain, unspecified: Secondary | ICD-10-CM | POA: Diagnosis not present

## 2018-07-19 DIAGNOSIS — W19XXXA Unspecified fall, initial encounter: Secondary | ICD-10-CM | POA: Diagnosis not present

## 2018-07-19 MED ORDER — TRAMADOL HCL 50 MG PO TABS
50.0000 mg | ORAL_TABLET | Freq: Once | ORAL | Status: AC
  Administered 2018-07-19: 50 mg via ORAL
  Filled 2018-07-19: qty 1

## 2018-07-19 NOTE — ED Provider Notes (Signed)
[facility] DEPT Provider Note   CSN: [id number] Arrival date & time: 07/19/18  1144    History   Chief Complaint Chief Complaint  Patient presents with  . Fall    HPI Alejandro Brown is a 63 y.o. male.     Patient c/o slip and fall just pta today. Was in bathroom, slipped on floor, left leg/knee buckled underneath him, c/o left knee pain post fall. Pain diffuse, mod-severe, non radiating, worse w movement. Denies hip or ankle pain. States 6 years ago had dislocation left knee, but denies needing surgery then. No head injury or loc. No headache. No neck or back pain. Denies any other pain or injury. No numbness/weakness. Skin intact.   The history is provided by the patient and the EMS personnel.  Fall  Pertinent negatives include no chest pain, no abdominal pain, no headaches and no shortness of breath.    Past Medical History:  Diagnosis Date  . ALLERGIC RHINITIS   . Allergy   . Arthritis   . Dependent edema    chronic L>R  . Diverticulosis of colon   . GERD (gastroesophageal reflux disease)   . Hypertension   . Mental retardation    mild  . Obesity   . OBSTRUCTIVE SLEEP APNEA    noncompliant with CPAP  . Sleep apnea   . Type II or unspecified type diabetes mellitus without mention of complication, not stated as uncontrolled    diet controlled  . Umbilical hernia     Patient Active Problem List   Diagnosis Date Noted  . Right shoulder pain 07/09/2018  . Umbilical hernia 09/64/3838  . Enlarged prostate 06/12/2015  . Elevated PSA 06/12/2015  . Ventral hernia 06/12/2015  . HTN (hypertension) 08/31/2010  . Diabetes type 2, controlled ([place]) 08/31/2010  . Obesity 08/31/2010  . Mental retardation 08/31/2010  . Dependent edema 08/31/2010  . Obstructive sleep apnea 05/05/2007  . ALLERGIC RHINITIS 05/04/2007    Past Surgical History:  Procedure Laterality Date  . HERNIA REPAIR    . NASAL SEPTUM SURGERY    . ROTATOR CUFF REPAIR  Left 06/2014   [name]        Home Medications    Prior to Admission medications   Medication Sig Start Date End Date Taking? Authorizing Provider  aspirin EC 81 MG tablet Take 1 tablet (81 mg total) by mouth daily. 04/21/14   Rowe Clack, MD  Calcium Polycarbophil (FIBER) 625 MG TABS Taking one daily 06/10/16   Binnie Rail, MD  Elastic Bandages & Supports (MEDICAL COMPRESSION SOCKS) MISC Medium compression 20-30 mm Hg for chronic leg edema 09/20/16   Binnie Rail, MD  furosemide (LASIX) 20 MG tablet TAKE 1 TABLET BY MOUTH EVERY DAY 05/17/18   Binnie Rail, MD  multivitamin (ONE-A-DAY MEN'S) TABS tablet Take 1 tablet by mouth daily. 04/21/14   Rowe Clack, MD  ramipril (ALTACE) 5 MG capsule TAKE 1 CAPSULE BY MOUTH EVERY DAY 03/08/18   Binnie Rail, MD    Family History Family History  Problem Relation Age of Onset  . Breast cancer Sister   . Breast cancer Other   . Colon cancer Neg Hx   . Esophageal cancer Neg Hx   . Rectal cancer Neg Hx   . Stomach cancer Neg Hx     Social History Social History   Tobacco Use  . Smoking status: Never Smoker  . Smokeless tobacco: Never Used  Substance Use Topics  .  Alcohol use: No    Alcohol/week: 0.0 standard drinks  . Drug use: No     Allergies   Clarithromycin   Review of Systems Review of Systems  Constitutional: Negative for fever.  HENT: Negative for nosebleeds.   Eyes: Negative for pain.  Respiratory: Negative for cough and shortness of breath.   Cardiovascular: Negative for chest pain.  Gastrointestinal: Negative for abdominal pain and vomiting.  Genitourinary: Negative for flank pain.  Musculoskeletal: Negative for back pain and neck pain.  Skin: Negative for wound.  Neurological: Negative for weakness, numbness and headaches.  Hematological: Does not bruise/bleed easily.  Psychiatric/Behavioral: Negative for confusion.     Physical Exam Updated Vital Signs There were no vitals taken for this  visit.  Physical Exam Vitals signs and nursing note reviewed.  Constitutional:      Appearance: Normal appearance. He is well-developed.  HENT:     Head: Atraumatic.     Nose: Nose normal.     Mouth/Throat:     Mouth: Mucous membranes are moist.     Pharynx: Oropharynx is clear.  Eyes:     General: No scleral icterus.    Conjunctiva/sclera: Conjunctivae normal.  Neck:     Musculoskeletal: Normal range of motion and neck supple. No neck rigidity.     Trachea: No tracheal deviation.  Cardiovascular:     Rate and Rhythm: Normal rate and regular rhythm.     Pulses: Normal pulses.     Heart sounds: Normal heart sounds. No murmur. No friction rub. No gallop.   Pulmonary:     Effort: Pulmonary effort is normal. No accessory muscle usage or respiratory distress.     Breath sounds: Normal breath sounds.  Abdominal:     General: Bowel sounds are normal. There is no distension.     Palpations: Abdomen is soft.     Tenderness: There is no abdominal tenderness.  Genitourinary:    Comments: No cva tenderness. Musculoskeletal:        General: No swelling.     Comments: CTLS spine, non tender, aligned, no step off. Patient hold left knee flexed, tenderness anteriorly to knee. Suspect left effusion. Pt initially unable to straighten/leg or relax leg for thorough knee exam. No pain/tenderness at left hip or ankle. Distal pulses palp. No other focal pain or bony tenderness on extremity exam.   Skin:    General: Skin is warm and dry.     Findings: No rash.  Neurological:     Mental Status: He is alert.     Comments: Alert, speech clear. LLE motor intact stre 5/5. sens in leg and foot grossly intact.   Psychiatric:        Mood and Affect: Mood normal.      ED Treatments / Results  Labs (all labs ordered are listed, but only abnormal results are displayed) Labs Reviewed - No data to display  EKG None  Radiology Dg Knee Complete 4 Views Left  Result Date: 07/19/2018 CLINICAL DATA:   Fall onto anterior knee. EXAM: LEFT KNEE - COMPLETE 4+ VIEW COMPARISON:  None. FINDINGS: No acute fracture or dislocation. Small joint effusion. Small well corticated ossific density along the lateral nonarticular margin of the lateral tibial plateau is likely due to old trauma. Mild patellofemoral compartment joint space narrowing. Small tricompartmental osteophytes. Bone mineralization is normal. Mild prepatellar soft tissue swelling. IMPRESSION: 1.  No acute osseous abnormality.  Small joint effusion. 2. Mild tricompartmental degenerative changes. Electronically Signed   By: Gwyndolyn Saxon  Marzella Schlein M.D.   On: 07/19/2018 12:35    Procedures Procedures (including critical care time)  Medications Ordered in ED Medications - No data to display   Initial Impression / Assessment and Plan / ED Course  I have reviewed the triage vital signs and the nursing notes.  Pertinent labs & imaging results that were available during my care of the patient were reviewed by me and considered in my medical decision making (see chart for details).  Xrays.  Reviewed nursing notes and prior charts for additional history.   Xrays reviewed by me - no fx, +effusion.    Patients knee now straight/relaxed. Knee is grossly stable, distal pulses palp. +effusion.   Knee immobilizer, icepack. Ultram po.  Patients pain is improved/controlled, pt currently appears stable for d/c.   Discussed w pt, no fx on xray, however possibility of soft tissue injury, including meniscus, ligaments, etc.       Final Clinical Impressions(s) / ED Diagnoses   Final diagnoses:  None    ED Discharge Orders    None       Lajean Saver, MD 07/19/18 1255

## 2018-07-19 NOTE — Discharge Instructions (Addendum)
It was our pleasure to provide your ER care today - we hope that you feel better.  Wear knee immobilizer for comfort/support. Icepack to sore area. Take motrin or aleve as need for pain.   As we discussed, although no fracture is seen, there is the possibility of injury to ligaments and/or meniscus. Follow up with orthopedist in the next 1-2 weeks - call office to arrange appointment.  Return to ER if worse, new symptoms, severe/intractable pain, numbness/weakness, other concern.  You were given pain medication in the ER - no driving for the next 4 hours.

## 2018-07-19 NOTE — ED Triage Notes (Signed)
Pt BIB EMS. Pt from independent living facility, [facility]. Pt slipped on tile floor, pt stated he landed on L knee. Pt c/o L knee pain. Pt ambulatory at home. Pt has hx of L knee injuries. Pain 8/10. 50 mcg fentenyl given by EMS  BP 144/88 HR 90 Resp 18 97% RA Temp 98.1 116 CBG

## 2018-07-19 NOTE — ED Notes (Signed)
Patient transported to X-ray 

## 2018-07-19 NOTE — ED Notes (Signed)
Bed: VX94 Expected date:  Expected time:  Means of arrival:  Comments: EMS 63 yo ind living fall

## 2018-07-19 NOTE — ED Notes (Signed)
ED Provider at bedside. 

## 2018-07-20 DIAGNOSIS — W19XXXA Unspecified fall, initial encounter: Secondary | ICD-10-CM | POA: Diagnosis not present

## 2018-07-20 DIAGNOSIS — S8362XA Sprain of the superior tibiofibular joint and ligament, left knee, initial encounter: Secondary | ICD-10-CM | POA: Diagnosis not present

## 2018-07-20 DIAGNOSIS — R52 Pain, unspecified: Secondary | ICD-10-CM | POA: Diagnosis not present

## 2018-07-20 DIAGNOSIS — M6281 Muscle weakness (generalized): Secondary | ICD-10-CM | POA: Diagnosis not present

## 2018-07-23 DIAGNOSIS — M6281 Muscle weakness (generalized): Secondary | ICD-10-CM | POA: Diagnosis not present

## 2018-07-23 DIAGNOSIS — R52 Pain, unspecified: Secondary | ICD-10-CM | POA: Diagnosis not present

## 2018-07-23 DIAGNOSIS — W19XXXA Unspecified fall, initial encounter: Secondary | ICD-10-CM | POA: Diagnosis not present

## 2018-07-23 DIAGNOSIS — S8362XA Sprain of the superior tibiofibular joint and ligament, left knee, initial encounter: Secondary | ICD-10-CM | POA: Diagnosis not present

## 2018-07-24 DIAGNOSIS — M6281 Muscle weakness (generalized): Secondary | ICD-10-CM | POA: Diagnosis not present

## 2018-07-24 DIAGNOSIS — M25562 Pain in left knee: Secondary | ICD-10-CM | POA: Diagnosis not present

## 2018-07-24 DIAGNOSIS — S8002XD Contusion of left knee, subsequent encounter: Secondary | ICD-10-CM | POA: Diagnosis not present

## 2018-07-24 DIAGNOSIS — R2689 Other abnormalities of gait and mobility: Secondary | ICD-10-CM | POA: Diagnosis not present

## 2018-07-25 ENCOUNTER — Other Ambulatory Visit: Payer: Self-pay | Admitting: Geriatric Medicine

## 2018-07-25 DIAGNOSIS — R609 Edema, unspecified: Secondary | ICD-10-CM

## 2018-07-25 DIAGNOSIS — M25562 Pain in left knee: Secondary | ICD-10-CM

## 2018-07-25 DIAGNOSIS — R600 Localized edema: Secondary | ICD-10-CM | POA: Diagnosis not present

## 2018-07-26 DIAGNOSIS — M25562 Pain in left knee: Secondary | ICD-10-CM | POA: Diagnosis not present

## 2018-07-26 DIAGNOSIS — S8002XD Contusion of left knee, subsequent encounter: Secondary | ICD-10-CM | POA: Diagnosis not present

## 2018-07-26 DIAGNOSIS — F319 Bipolar disorder, unspecified: Secondary | ICD-10-CM | POA: Diagnosis not present

## 2018-07-26 DIAGNOSIS — R2689 Other abnormalities of gait and mobility: Secondary | ICD-10-CM | POA: Diagnosis not present

## 2018-07-26 DIAGNOSIS — M6281 Muscle weakness (generalized): Secondary | ICD-10-CM | POA: Diagnosis not present

## 2018-07-27 DIAGNOSIS — M6281 Muscle weakness (generalized): Secondary | ICD-10-CM | POA: Diagnosis not present

## 2018-07-27 DIAGNOSIS — R638 Other symptoms and signs concerning food and fluid intake: Secondary | ICD-10-CM | POA: Diagnosis not present

## 2018-07-31 DIAGNOSIS — F319 Bipolar disorder, unspecified: Secondary | ICD-10-CM | POA: Diagnosis not present

## 2018-08-01 ENCOUNTER — Other Ambulatory Visit: Payer: Self-pay

## 2018-08-01 ENCOUNTER — Ambulatory Visit
Admission: RE | Admit: 2018-08-01 | Discharge: 2018-08-01 | Disposition: A | Discharge status: Home / Self Care | Payer: Federal, State, Local not specified - PPO | Source: Ambulatory Visit | Attending: Geriatric Medicine | Admitting: Geriatric Medicine

## 2018-08-01 DIAGNOSIS — R609 Edema, unspecified: Secondary | ICD-10-CM

## 2018-08-01 DIAGNOSIS — M25562 Pain in left knee: Secondary | ICD-10-CM | POA: Diagnosis not present

## 2018-08-07 DIAGNOSIS — F319 Bipolar disorder, unspecified: Secondary | ICD-10-CM | POA: Diagnosis not present

## 2018-08-10 ENCOUNTER — Encounter (HOSPITAL_BASED_OUTPATIENT_CLINIC_OR_DEPARTMENT_OTHER): Payer: Self-pay | Admitting: *Deleted

## 2018-08-10 ENCOUNTER — Encounter: Payer: Self-pay | Admitting: Orthopaedic Surgery

## 2018-08-10 ENCOUNTER — Other Ambulatory Visit: Payer: Self-pay

## 2018-08-10 ENCOUNTER — Ambulatory Visit: Payer: Federal, State, Local not specified - PPO | Admitting: Orthopaedic Surgery

## 2018-08-10 DIAGNOSIS — M6629 Spontaneous rupture of extensor tendons, multiple sites: Secondary | ICD-10-CM | POA: Diagnosis not present

## 2018-08-10 DIAGNOSIS — S86812A Strain of other muscle(s) and tendon(s) at lower leg level, left leg, initial encounter: Secondary | ICD-10-CM

## 2018-08-10 DIAGNOSIS — R2689 Other abnormalities of gait and mobility: Secondary | ICD-10-CM | POA: Diagnosis not present

## 2018-08-10 NOTE — Progress Notes (Signed)
Patient is resident of [facility] and is currently in their Wellness/rehab center. I talked to the nurse supervisor Rubin Payor on duty and gave her all preop info. Pt will arrive at 0700 via their transportation system and they will leave a card with their # to call for pickup. RN supervisor on call # is [phone].

## 2018-08-10 NOTE — Progress Notes (Signed)
Office Visit Note   Patient: Alejandro Brown           Date of Birth: [birth year]           MRN: [id number] Visit Date: 08/10/2018              Requested by: Binnie Rail, MD [place], [place] [address] PCP: Binnie Rail, MD   Assessment & Plan: Visit Diagnoses:  1. Patellar tendon rupture, left, initial encounter     Plan: Impression is left patella tendon rupture as well as MCL rupture, partial ACL and PCL rupture.  These findings were reviewed with the patient and in my opinion I think the patella tendon rupture is the only thing that needs to be addressed surgically.  Details of the surgery as well as the risks and benefits were reviewed with the patient.  Reasonable expectations for recovery and ultimate function were discussed.  Questions answered to their satisfaction.  We will plan on surgery next week.  In the meantime we have given him a prescription for unlocked Bledsoe brace.  Follow-Up Instructions: Return for 2 week postop visit.   Orders:  No orders of the defined types were placed in this encounter.  No orders of the defined types were placed in this encounter.     Procedures: No procedures performed   Clinical Data: No additional findings.   Subjective: Chief Complaint  Patient presents with  . Left Knee - Pain    Alejandro Brown is a 63 year old gentleman who is mildly mentally challenged who comes in with an acute left patella tendon rupture that he sustained on 07/19/2018.  He fell initially on that date and was evaluated in the ED and the x-rays were interpreted as normal however on personal review of the x-rays I feel that he had patella alta which may have pointed to patella tendon rupture at that time.  He subsequently fell again and he then had an MRI of the left knee which showed multiple ligamentous injuries.  He lives permanently at [facility] facility due to his mental handicap.  Prior to the fall he ambulated normally without any assistive  devices.   Review of Systems  Constitutional: Negative.   All other systems reviewed and are negative.    Objective: Vital Signs: There were no vitals taken for this visit.  Physical Exam Vitals signs and nursing note reviewed.  Constitutional:      Appearance: He is well-developed.  HENT:     Head: Normocephalic and atraumatic.  Eyes:     Pupils: Pupils are equal, round, and reactive to light.  Neck:     Musculoskeletal: Neck supple.  Pulmonary:     Effort: Pulmonary effort is normal.  Abdominal:     Palpations: Abdomen is soft.  Musculoskeletal: Normal range of motion.  Skin:    General: Skin is warm.  Neurological:     Mental Status: He is alert and oriented to person, place, and time.  Psychiatric:        Behavior: Behavior normal.        Thought Content: Thought content normal.        Judgment: Judgment normal.     Ortho Exam Left knee exam shows a large joint effusion.  MCL has 2+ laxity with a stable endpoint.  LCL is intact.  Difficult to assess ACL PCL.  He is unable to extend his knee against gravity.  Specialty Comments:  No specialty comments available.  Imaging: No  results found.   PMFS History: Patient Active Problem List   Diagnosis Date Noted  . Right shoulder pain 07/09/2018  . Umbilical hernia 13/10/6576  . Enlarged prostate 06/12/2015  . Elevated PSA 06/12/2015  . Ventral hernia 06/12/2015  . HTN (hypertension) 08/31/2010  . Diabetes type 2, controlled ([place]) 08/31/2010  . Obesity 08/31/2010  . Mental retardation 08/31/2010  . Dependent edema 08/31/2010  . Obstructive sleep apnea 05/05/2007  . ALLERGIC RHINITIS 05/04/2007   Past Medical History:  Diagnosis Date  . ALLERGIC RHINITIS   . Allergy   . Arthritis   . Dependent edema    chronic L>R  . Diverticulosis of colon   . GERD (gastroesophageal reflux disease)   . Hypertension   . Mental retardation    mild  . Obesity   . OBSTRUCTIVE SLEEP APNEA    noncompliant with CPAP   . Sleep apnea   . Type II or unspecified type diabetes mellitus without mention of complication, not stated as uncontrolled    diet controlled  . Umbilical hernia     Family History  Problem Relation Age of Onset  . Breast cancer Sister   . Breast cancer Other   . Colon cancer Neg Hx   . Esophageal cancer Neg Hx   . Rectal cancer Neg Hx   . Stomach cancer Neg Hx     Past Surgical History:  Procedure Laterality Date  . HERNIA REPAIR    . NASAL SEPTUM SURGERY    . ROTATOR CUFF REPAIR Left 06/2014   [name]   Social History   Occupational History  . 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 Smoking status: Never Smoker  . Smokeless tobacco: Never Used  Substance and Sexual Activity  . Alcohol use: No    Alcohol/week: 0.0 standard drinks  . Drug use: No  . Sexual activity: Not on file

## 2018-08-14 ENCOUNTER — Other Ambulatory Visit (HOSPITAL_COMMUNITY)
Admission: RE | Admit: 2018-08-14 | Discharge: 2018-08-14 | Disposition: A | Discharge status: Home / Self Care | Payer: Federal, State, Local not specified - PPO | Source: Ambulatory Visit | Attending: Orthopaedic Surgery | Admitting: Orthopaedic Surgery

## 2018-08-14 ENCOUNTER — Encounter (HOSPITAL_BASED_OUTPATIENT_CLINIC_OR_DEPARTMENT_OTHER)
Admission: RE | Admit: 2018-08-14 | Discharge: 2018-08-14 | Disposition: A | Discharge status: Home / Self Care | Payer: Federal, State, Local not specified - PPO | Source: Ambulatory Visit | Attending: Orthopaedic Surgery | Admitting: Orthopaedic Surgery

## 2018-08-14 ENCOUNTER — Other Ambulatory Visit: Payer: Self-pay

## 2018-08-14 DIAGNOSIS — S76112A Strain of left quadriceps muscle, fascia and tendon, initial encounter: Secondary | ICD-10-CM | POA: Diagnosis not present

## 2018-08-14 DIAGNOSIS — Z1159 Encounter for screening for other viral diseases: Secondary | ICD-10-CM | POA: Diagnosis not present

## 2018-08-14 DIAGNOSIS — E669 Obesity, unspecified: Secondary | ICD-10-CM | POA: Diagnosis not present

## 2018-08-14 DIAGNOSIS — Z79899 Other long term (current) drug therapy: Secondary | ICD-10-CM | POA: Diagnosis not present

## 2018-08-14 DIAGNOSIS — Z6837 Body mass index (BMI) 37.0-37.9, adult: Secondary | ICD-10-CM | POA: Diagnosis not present

## 2018-08-14 DIAGNOSIS — Z7982 Long term (current) use of aspirin: Secondary | ICD-10-CM | POA: Diagnosis not present

## 2018-08-14 DIAGNOSIS — M1990 Unspecified osteoarthritis, unspecified site: Secondary | ICD-10-CM | POA: Diagnosis not present

## 2018-08-14 DIAGNOSIS — G4733 Obstructive sleep apnea (adult) (pediatric): Secondary | ICD-10-CM | POA: Diagnosis not present

## 2018-08-14 DIAGNOSIS — E119 Type 2 diabetes mellitus without complications: Secondary | ICD-10-CM | POA: Diagnosis not present

## 2018-08-14 DIAGNOSIS — M66262 Spontaneous rupture of extensor tendons, left lower leg: Secondary | ICD-10-CM | POA: Diagnosis not present

## 2018-08-14 DIAGNOSIS — K219 Gastro-esophageal reflux disease without esophagitis: Secondary | ICD-10-CM | POA: Diagnosis not present

## 2018-08-14 DIAGNOSIS — X58XXXA Exposure to other specified factors, initial encounter: Secondary | ICD-10-CM | POA: Diagnosis not present

## 2018-08-14 DIAGNOSIS — F79 Unspecified intellectual disabilities: Secondary | ICD-10-CM | POA: Diagnosis not present

## 2018-08-14 DIAGNOSIS — Z7984 Long term (current) use of oral hypoglycemic drugs: Secondary | ICD-10-CM | POA: Diagnosis not present

## 2018-08-14 DIAGNOSIS — R9431 Abnormal electrocardiogram [ECG] [EKG]: Secondary | ICD-10-CM | POA: Diagnosis not present

## 2018-08-14 DIAGNOSIS — I10 Essential (primary) hypertension: Secondary | ICD-10-CM | POA: Diagnosis not present

## 2018-08-14 LAB — CBC WITH DIFFERENTIAL/PLATELET
Abs Immature Granulocytes: 0.14 10*3/uL — ABNORMAL HIGH (ref 0.00–0.07)
Basophils Absolute: 0.1 10*3/uL (ref 0.0–0.1)
Basophils Relative: 1 %
Eosinophils Absolute: 0.3 10*3/uL (ref 0.0–0.5)
Eosinophils Relative: 4 %
HCT: 44.8 % (ref 39.0–52.0)
Hemoglobin: 14.3 g/dL (ref 13.0–17.0)
Immature Granulocytes: 2 %
Lymphocytes Relative: 22 %
Lymphs Abs: 1.7 10*3/uL (ref 0.7–4.0)
MCH: 29.7 pg (ref 26.0–34.0)
MCHC: 31.9 g/dL (ref 30.0–36.0)
MCV: 92.9 fL (ref 80.0–100.0)
Monocytes Absolute: 0.5 10*3/uL (ref 0.1–1.0)
Monocytes Relative: 7 %
Neutro Abs: 5.1 10*3/uL (ref 1.7–7.7)
Neutrophils Relative %: 64 %
Platelets: 475 10*3/uL — ABNORMAL HIGH (ref 150–400)
RBC: 4.82 MIL/uL (ref 4.22–5.81)
RDW: 11.5 % (ref 11.5–15.5)
WBC: 7.9 10*3/uL (ref 4.0–10.5)
nRBC: 0 % (ref 0.0–0.2)

## 2018-08-14 LAB — BASIC METABOLIC PANEL
Anion gap: 9 (ref 5–15)
BUN: 15 mg/dL (ref 8–23)
CO2: 29 mmol/L (ref 22–32)
Calcium: 9.7 mg/dL (ref 8.9–10.3)
Chloride: 101 mmol/L (ref 98–111)
Creatinine, Ser: 0.98 mg/dL (ref 0.61–1.24)
GFR calc Af Amer: >60 mL/min (ref >60)
GFR calc non Af Amer: >60 mL/min (ref >60)
Glucose, Bld: 119 mg/dL — ABNORMAL HIGH (ref 70–99)
Potassium: 4.3 mmol/L (ref 3.5–5.1)
Sodium: 139 mmol/L (ref 135–145)

## 2018-08-14 LAB — SARS CORONAVIRUS 2 BY RT PCR (HOSPITAL ORDER, PERFORMED IN ~~LOC~~ HOSPITAL LAB): SARS Coronavirus 2: NEGATIVE

## 2018-08-14 NOTE — Progress Notes (Addendum)
Gatorade 2 drink given with instructions, pt verbalized understanding.  EKG reviewed by Dr Sabra Heck, will proceed with surgery as scheduled.

## 2018-08-15 ENCOUNTER — Encounter (HOSPITAL_BASED_OUTPATIENT_CLINIC_OR_DEPARTMENT_OTHER)
Admission: RE | Disposition: A | Discharge status: Home / Self Care | Payer: Self-pay | Source: Home / Self Care | Attending: Orthopaedic Surgery

## 2018-08-15 ENCOUNTER — Ambulatory Visit (HOSPITAL_BASED_OUTPATIENT_CLINIC_OR_DEPARTMENT_OTHER): Payer: Federal, State, Local not specified - PPO | Admitting: Anesthesiology

## 2018-08-15 ENCOUNTER — Encounter (HOSPITAL_BASED_OUTPATIENT_CLINIC_OR_DEPARTMENT_OTHER): Payer: Self-pay | Admitting: Anesthesiology

## 2018-08-15 ENCOUNTER — Other Ambulatory Visit: Payer: Self-pay

## 2018-08-15 ENCOUNTER — Ambulatory Visit (HOSPITAL_BASED_OUTPATIENT_CLINIC_OR_DEPARTMENT_OTHER)
Admission: RE | Admit: 2018-08-15 | Discharge: 2018-08-15 | Disposition: A | Discharge status: Home / Self Care | Payer: Federal, State, Local not specified - PPO | Attending: Orthopaedic Surgery | Admitting: Orthopaedic Surgery

## 2018-08-15 DIAGNOSIS — Z7984 Long term (current) use of oral hypoglycemic drugs: Secondary | ICD-10-CM | POA: Insufficient documentation

## 2018-08-15 DIAGNOSIS — Z7982 Long term (current) use of aspirin: Secondary | ICD-10-CM | POA: Insufficient documentation

## 2018-08-15 DIAGNOSIS — K219 Gastro-esophageal reflux disease without esophagitis: Secondary | ICD-10-CM | POA: Diagnosis not present

## 2018-08-15 DIAGNOSIS — I10 Essential (primary) hypertension: Secondary | ICD-10-CM | POA: Diagnosis not present

## 2018-08-15 DIAGNOSIS — X58XXXA Exposure to other specified factors, initial encounter: Secondary | ICD-10-CM | POA: Diagnosis not present

## 2018-08-15 DIAGNOSIS — G4733 Obstructive sleep apnea (adult) (pediatric): Secondary | ICD-10-CM | POA: Insufficient documentation

## 2018-08-15 DIAGNOSIS — E669 Obesity, unspecified: Secondary | ICD-10-CM | POA: Insufficient documentation

## 2018-08-15 DIAGNOSIS — R9431 Abnormal electrocardiogram [ECG] [EKG]: Secondary | ICD-10-CM | POA: Insufficient documentation

## 2018-08-15 DIAGNOSIS — S86812A Strain of other muscle(s) and tendon(s) at lower leg level, left leg, initial encounter: Secondary | ICD-10-CM

## 2018-08-15 DIAGNOSIS — F79 Unspecified intellectual disabilities: Secondary | ICD-10-CM | POA: Diagnosis not present

## 2018-08-15 DIAGNOSIS — S76112A Strain of left quadriceps muscle, fascia and tendon, initial encounter: Secondary | ICD-10-CM | POA: Diagnosis not present

## 2018-08-15 DIAGNOSIS — G4730 Sleep apnea, unspecified: Secondary | ICD-10-CM | POA: Diagnosis not present

## 2018-08-15 DIAGNOSIS — Z79899 Other long term (current) drug therapy: Secondary | ICD-10-CM | POA: Insufficient documentation

## 2018-08-15 DIAGNOSIS — M66262 Spontaneous rupture of extensor tendons, left lower leg: Secondary | ICD-10-CM | POA: Diagnosis not present

## 2018-08-15 DIAGNOSIS — G8918 Other acute postprocedural pain: Secondary | ICD-10-CM | POA: Diagnosis not present

## 2018-08-15 DIAGNOSIS — M1990 Unspecified osteoarthritis, unspecified site: Secondary | ICD-10-CM | POA: Insufficient documentation

## 2018-08-15 DIAGNOSIS — E119 Type 2 diabetes mellitus without complications: Secondary | ICD-10-CM | POA: Insufficient documentation

## 2018-08-15 DIAGNOSIS — Z6837 Body mass index (BMI) 37.0-37.9, adult: Secondary | ICD-10-CM | POA: Insufficient documentation

## 2018-08-15 HISTORY — DX: Strain of other muscle(s) and tendon(s) at lower leg level, left leg, initial encounter: S86.812A

## 2018-08-15 HISTORY — PX: PATELLAR TENDON REPAIR: SHX737

## 2018-08-15 SURGERY — REPAIR, TENDON, PATELLAR
Anesthesia: General | Site: Knee | Laterality: Left

## 2018-08-15 MED ORDER — BUPIVACAINE-EPINEPHRINE (PF) 0.5% -1:200000 IJ SOLN
INTRAMUSCULAR | Status: DC | PRN
  Administered 2018-08-15: 30 mL via PERINEURAL

## 2018-08-15 MED ORDER — LIDOCAINE HCL (CARDIAC) PF 100 MG/5ML IV SOSY
PREFILLED_SYRINGE | INTRAVENOUS | Status: DC | PRN
  Administered 2018-08-15: 200 mg via INTRAVENOUS

## 2018-08-15 MED ORDER — LACTATED RINGERS IV SOLN: INTRAVENOUS | Status: DC

## 2018-08-15 MED ORDER — CHLORHEXIDINE GLUCONATE 4 % EX LIQD: 60.0000 mL | Freq: Once | CUTANEOUS | Status: DC

## 2018-08-15 MED ORDER — SCOPOLAMINE 1 MG/3DAYS TD PT72: 1.0000 | MEDICATED_PATCH | Freq: Once | TRANSDERMAL | Status: DC | PRN

## 2018-08-15 MED ORDER — ONDANSETRON HCL 4 MG/2ML IJ SOLN: 4.0000 mg | Freq: Once | INTRAMUSCULAR | Status: DC | PRN

## 2018-08-15 MED ORDER — FENTANYL CITRATE (PF) 100 MCG/2ML IJ SOLN
INTRAMUSCULAR | Status: AC
  Filled 2018-08-15: qty 2

## 2018-08-15 MED ORDER — MEPERIDINE HCL 25 MG/ML IJ SOLN: 6.2500 mg | INTRAMUSCULAR | Status: DC | PRN

## 2018-08-15 MED ORDER — CEFAZOLIN SODIUM-DEXTROSE 2-4 GM/100ML-% IV SOLN
INTRAVENOUS | Status: AC
  Filled 2018-08-15: qty 100

## 2018-08-15 MED ORDER — PHENYLEPHRINE HCL (PRESSORS) 10 MG/ML IV SOLN
INTRAVENOUS | Status: DC | PRN
  Administered 2018-08-15: 120 ug via INTRAVENOUS

## 2018-08-15 MED ORDER — MIDAZOLAM HCL 2 MG/2ML IJ SOLN
INTRAMUSCULAR | Status: AC
  Filled 2018-08-15: qty 2

## 2018-08-15 MED ORDER — OXYCODONE HCL 5 MG PO TABS: 5.0000 mg | ORAL_TABLET | Freq: Once | ORAL | Status: DC | PRN

## 2018-08-15 MED ORDER — MIDAZOLAM HCL 2 MG/2ML IJ SOLN
1.0000 mg | INTRAMUSCULAR | Status: DC | PRN
  Administered 2018-08-15: 08:00:00 1 mg via INTRAVENOUS

## 2018-08-15 MED ORDER — FENTANYL CITRATE (PF) 100 MCG/2ML IJ SOLN
25.0000 ug | INTRAMUSCULAR | Status: DC | PRN
  Administered 2018-08-15: 50 ug via INTRAVENOUS
  Administered 2018-08-15 (×2): 25 ug via INTRAVENOUS

## 2018-08-15 MED ORDER — OXYCODONE HCL 5 MG/5ML PO SOLN: 5.0000 mg | Freq: Once | ORAL | Status: DC | PRN

## 2018-08-15 MED ORDER — FENTANYL CITRATE (PF) 100 MCG/2ML IJ SOLN
50.0000 ug | INTRAMUSCULAR | Status: AC | PRN
  Administered 2018-08-15 (×4): 50 ug via INTRAVENOUS

## 2018-08-15 MED ORDER — CEFAZOLIN SODIUM-DEXTROSE 2-4 GM/100ML-% IV SOLN
2.0000 g | INTRAVENOUS | Status: AC
  Administered 2018-08-15: 08:00:00 2 g via INTRAVENOUS

## 2018-08-15 MED ORDER — ONDANSETRON HCL 4 MG/2ML IJ SOLN
INTRAMUSCULAR | Status: DC | PRN
  Administered 2018-08-15: 4 mg via INTRAVENOUS

## 2018-08-15 MED ORDER — LACTATED RINGERS IV SOLN
INTRAVENOUS | Status: DC
  Administered 2018-08-15 (×2): via INTRAVENOUS

## 2018-08-15 SURGICAL SUPPLY — 69 items
ANCH SUT SWVLOCK 8.5 (Anchor) ×2 IMPLANT
ANCHOR DX SWIVELOCK SL 3.5X8.5 (Anchor) ×4 IMPLANT
BANDAGE ACE 4X5 VEL STRL LF (GAUZE/BANDAGES/DRESSINGS) ×2 IMPLANT
BANDAGE ACE 6X5 VEL STRL LF (GAUZE/BANDAGES/DRESSINGS) ×2 IMPLANT
BANDAGE ESMARK 6X9 LF (GAUZE/BANDAGES/DRESSINGS) ×1 IMPLANT
BLADE HEX COATED 2.75 (ELECTRODE) ×2 IMPLANT
BLADE SURG 15 STRL LF DISP TIS (BLADE) ×3 IMPLANT
BLADE SURG 15 STRL SS (BLADE) ×6
BNDG CMPR 9X6 STRL LF SNTH (GAUZE/BANDAGES/DRESSINGS) ×1
BNDG ESMARK 6X9 LF (GAUZE/BANDAGES/DRESSINGS) ×2
COVER WAND RF STERILE (DRAPES) IMPLANT
CUFF TOURN SGL QUICK 24 (TOURNIQUET CUFF)
CUFF TOURN SGL QUICK 34 (TOURNIQUET CUFF) ×2
CUFF TRNQT CYL 24X4X16.5-23 (TOURNIQUET CUFF) IMPLANT
CUFF TRNQT CYL 34X4.125X (TOURNIQUET CUFF) ×1 IMPLANT
DECANTER SPIKE VIAL GLASS SM (MISCELLANEOUS) IMPLANT
DRAPE EXTREMITY T 121X128X90 (DISPOSABLE) ×2 IMPLANT
DRAPE U-SHAPE 47X51 STRL (DRAPES) ×2 IMPLANT
DRSG AQUACEL AG ADV 3.5X10 (GAUZE/BANDAGES/DRESSINGS) ×2 IMPLANT
DURAPREP 26ML APPLICATOR (WOUND CARE) ×2 IMPLANT
ELECT REM PT RETURN 9FT ADLT (ELECTROSURGICAL) ×2
ELECTRODE REM PT RTRN 9FT ADLT (ELECTROSURGICAL) ×1 IMPLANT
GAUZE SPONGE 4X4 12PLY STRL (GAUZE/BANDAGES/DRESSINGS) ×2 IMPLANT
GAUZE XEROFORM 1X8 LF (GAUZE/BANDAGES/DRESSINGS) ×2 IMPLANT
GLOVE BIOGEL PI IND STRL 7.0 (GLOVE) ×1 IMPLANT
GLOVE BIOGEL PI INDICATOR 7.0 (GLOVE) ×1
GLOVE ECLIPSE 7.0 STRL STRAW (GLOVE) ×2 IMPLANT
GLOVE SKINSENSE NS SZ7.5 (GLOVE) ×1
GLOVE SKINSENSE STRL SZ7.5 (GLOVE) ×1 IMPLANT
GLOVE SURG SYN 7.5  E (GLOVE) ×1
GLOVE SURG SYN 7.5 E (GLOVE) ×1 IMPLANT
GOWN STRL REIN XL XLG (GOWN DISPOSABLE) ×2 IMPLANT
GOWN STRL REUS W/ TWL LRG LVL3 (GOWN DISPOSABLE) ×1 IMPLANT
GOWN STRL REUS W/ TWL XL LVL3 (GOWN DISPOSABLE) ×1 IMPLANT
GOWN STRL REUS W/TWL LRG LVL3 (GOWN DISPOSABLE) ×2
GOWN STRL REUS W/TWL XL LVL3 (GOWN DISPOSABLE) ×2
IMMOBILIZER KNEE 22 UNIV (SOFTGOODS) IMPLANT
IMMOBILIZER KNEE 24 THIGH 36 (MISCELLANEOUS) IMPLANT
IMMOBILIZER KNEE 24 UNIV (MISCELLANEOUS)
KIT SWIVELOCK DX 3.5X8.5 DISP (MISCELLANEOUS) ×2 IMPLANT
MANIFOLD NEPTUNE II (INSTRUMENTS) ×2 IMPLANT
NS IRRIG 1000ML POUR BTL (IV SOLUTION) ×6 IMPLANT
PACK ARTHROSCOPY DSU (CUSTOM PROCEDURE TRAY) ×2 IMPLANT
PACK BASIN DAY SURGERY FS (CUSTOM PROCEDURE TRAY) ×2 IMPLANT
PAD CAST 4YDX4 CTTN HI CHSV (CAST SUPPLIES) ×1 IMPLANT
PADDING CAST COTTON 4X4 STRL (CAST SUPPLIES) ×2
PADDING CAST COTTON 6X4 STRL (CAST SUPPLIES) ×2 IMPLANT
PENCIL BUTTON HOLSTER BLD 10FT (ELECTRODE) ×2 IMPLANT
RETRIEVER SUT HEWSON (MISCELLANEOUS) ×2 IMPLANT
SLEEVE SCD COMPRESS KNEE MED (MISCELLANEOUS) IMPLANT
SPONGE LAP 18X18 RF (DISPOSABLE) ×4 IMPLANT
STAPLER VISISTAT (STAPLE) IMPLANT
SUCTION FRAZIER HANDLE 10FR (MISCELLANEOUS) ×1
SUCTION TUBE FRAZIER 10FR DISP (MISCELLANEOUS) ×1 IMPLANT
SUT ETHILON 3 0 PS 1 (SUTURE) IMPLANT
SUT FIBERWIRE #2 38 T-5 BLUE (SUTURE) ×2
SUT VIC AB 0 CT1 18XCR BRD 8 (SUTURE) ×1 IMPLANT
SUT VIC AB 0 CT1 8-18 (SUTURE) ×2
SUT VIC AB 2-0 CT1 27 (SUTURE)
SUT VIC AB 2-0 CT1 TAPERPNT 27 (SUTURE) IMPLANT
SUT VIC AB 2-0 SH 27 (SUTURE) ×2
SUT VIC AB 2-0 SH 27XBRD (SUTURE) ×1 IMPLANT
SUTURE FIBERWR #2 38 T-5 BLUE (SUTURE) ×1 IMPLANT
SYS INTERNAL BRACE KNEE (Miscellaneous) ×2 IMPLANT
SYSTEM INTERNAL BRACE KNEE (Miscellaneous) ×1 IMPLANT
TOWEL GREEN STERILE FF (TOWEL DISPOSABLE) ×4 IMPLANT
TUBE CONNECTING 20X1/4 (TUBING) ×2 IMPLANT
UNDERPAD 30X30 (UNDERPADS AND DIAPERS) ×2 IMPLANT
YANKAUER SUCT BULB TIP NO VENT (SUCTIONS) IMPLANT

## 2018-08-15 NOTE — Anesthesia Procedure Notes (Signed)
Procedure Name: LMA Insertion Date/Time: 08/15/2018 8:36 AM Performed by: Willa Frater, CRNA Pre-anesthesia Checklist: Patient identified, Emergency Drugs available, Suction available and Patient being monitored Patient Re-evaluated:Patient Re-evaluated prior to induction Oxygen Delivery Method: Circle system utilized Preoxygenation: Pre-oxygenation with 100% oxygen Induction Type: IV induction Ventilation: Mask ventilation without difficulty LMA: LMA inserted LMA Size: 5.0 Number of attempts: 1 Airway Equipment and Method: Bite block Placement Confirmation: positive ETCO2 Tube secured with: Tape Dental Injury: Teeth and Oropharynx as per pre-operative assessment

## 2018-08-15 NOTE — H&P (Signed)
PREOPERATIVE H&P  Chief Complaint: left patellar tendon rupture  HPI: Alejandro Brown is a 63 y.o. male who presents for surgical treatment of left patellar tendon rupture.  He denies any changes in medical history.  Past Medical History:  Diagnosis Date  . ALLERGIC RHINITIS   . Allergy   . Arthritis   . Dependent edema    chronic L>R  . Diverticulosis of colon   . GERD (gastroesophageal reflux disease)   . Hypertension   . Mental retardation    mild  . Obesity   . OBSTRUCTIVE SLEEP APNEA    noncompliant with CPAP  . Rupture of left patellar tendon   . Sleep apnea   . Type II or unspecified type diabetes mellitus without mention of complication, not stated as uncontrolled    diet controlled  . Umbilical hernia    Past Surgical History:  Procedure Laterality Date  . HERNIA REPAIR    . NASAL SEPTUM SURGERY    . ROTATOR CUFF REPAIR Left 06/2014   [name]   Social History   Socioeconomic History  . Marital status: Single    Spouse name: n/a  . Number of children: 0  . Years of education: 12th grade  . Highest education level: Not on file  Occupational History  . 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 Financial resource strain: Not on file  . Food insecurity:    Worry: Not on file    Inability: Not on file  . Transportation needs:    Medical: Not on file    Non-medical: Not on file  Tobacco Use  . Smoking status: Never Smoker  . Smokeless tobacco: Never Used  Substance and Sexual Activity  . Alcohol use: No    Alcohol/week: 0.0 standard drinks  . Drug use: No  . Sexual activity: Not on file  Lifestyle  . Physical activity:    Days per week: Not on file    Minutes per session: Not on file  . Stress: Not on file  Relationships  . Social connections:    Talks on phone: Not on file    Gets together: Not on file    Attends religious service: Not on file    Active member of club or organization: Not on file   Attends meetings of clubs or organizations: Not on file    Relationship status: Not on file  Other Topics Concern  . 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   Family History  Problem Relation Age of Onset  . Breast cancer Sister   . Breast cancer Other   . Colon cancer Neg Hx   . Esophageal cancer Neg Hx   . Rectal cancer Neg Hx   . Stomach cancer Neg Hx    Allergies  Allergen Reactions  . Clarithromycin Nausea Only   Prior to Admission medications   Medication Sig Start Date End Date Taking? Authorizing Provider  aspirin EC 81 MG tablet Take 1 tablet (81 mg total) by mouth daily. 04/21/14  Yes Rowe Clack, MD  Calcium Polycarbophil (FIBER) 625 MG TABS Taking one daily Patient taking differently: Take 625 mg by mouth daily. Taking one daily 06/10/16  Yes Burns, Claudina Lick, MD  Cholecalciferol (VITAMIN D3) 50 MCG (2000 UT) TABS Take by mouth.   Yes [provider]  Ensure (ENSURE) Take 237 mLs  by mouth.   Yes [provider]  furosemide (LASIX) 20 MG tablet TAKE 1 TABLET BY MOUTH EVERY DAY Patient taking differently: Take 20 mg by mouth daily.  05/17/18  Yes Burns, Claudina Lick, MD  HYDROcodone-acetaminophen (NORCO) 10-325 MG tablet Take 1 tablet by mouth every 6 (six) hours as needed.   Yes [provider]  multivitamin (ONE-A-DAY MEN'S) TABS tablet Take 1 tablet by mouth daily. 04/21/14  Yes Rowe Clack, MD  polyethylene glycol (MIRALAX / GLYCOLAX) 17 g packet Take 17 g by mouth daily.   Yes [provider]  ramipril (ALTACE) 5 MG capsule TAKE 1 CAPSULE BY MOUTH EVERY DAY Patient taking differently: Take 5 mg by mouth daily.  03/08/18  Yes Burns, Claudina Lick, MD  traMADol (ULTRAM) 50 MG tablet Take 50 mg by mouth every 6 (six) hours as needed.   Yes [provider]  Elastic Bandages & Supports (MEDICAL COMPRESSION SOCKS) MISC Medium compression 20-30 mm  Hg for chronic leg edema 09/20/16   Binnie Rail, MD     Positive ROS: All other systems have been reviewed and were otherwise negative with the exception of those mentioned in the HPI and as above.  Physical Exam: General: Alert, no acute distress Cardiovascular: No pedal edema Respiratory: No cyanosis, no use of accessory musculature GI: abdomen soft Skin: No lesions in the area of chief complaint Neurologic: Sensation intact distally Psychiatric: Patient is competent for consent with normal mood and affect Lymphatic: no lymphedema  MUSCULOSKELETAL: exam stable  Assessment: left patellar tendon rupture  Plan: Plan for Procedure(s): LEFT PATELLAR TENDON REPAIR  The risks benefits and alternatives were discussed with the patient including but not limited to the risks of nonoperative treatment, versus surgical intervention including infection, bleeding, nerve injury,  blood clots, cardiopulmonary complications, morbidity, mortality, among others, and they were willing to proceed.   Eduard Roux, MD   08/15/2018 8:01 AM

## 2018-08-15 NOTE — Transfer of Care (Signed)
Immediate Anesthesia Transfer of Care Note  Patient: Alejandro Brown  Procedure(s) Performed: LEFT PATELLAR TENDON REPAIR (Left Knee)  Patient Location: PACU  Anesthesia Type:GA combined with regional for post-op pain  Level of Consciousness: awake, alert , oriented and drowsy  Airway & Oxygen Therapy: Patient Spontanous Breathing and Patient connected to face mask oxygen  Post-op Assessment: Report given to RN and Post -op Vital signs reviewed and stable  Post vital signs: Reviewed and stable  Last Vitals:  Vitals Value Taken Time  BP    Temp    Pulse 101 08/15/2018 10:29 AM  Resp 25 08/15/2018 10:29 AM  SpO2 96 % 08/15/2018 10:29 AM  Vitals shown include unvalidated device data.  Last Pain:  Vitals:   08/15/18 0739  TempSrc: Oral  PainSc: 7       Patients Stated Pain Goal: 3 (02/40/97 3532)  Complications: No apparent anesthesia complications

## 2018-08-15 NOTE — Anesthesia Preprocedure Evaluation (Signed)
Anesthesia Evaluation  Patient identified by MRN, date of birth, ID band Patient awake    Reviewed: Allergy & Precautions, NPO status , Patient's Chart, lab work & pertinent test results  Airway Mallampati: III  TM Distance: >3 FB Neck ROM: Full    Dental no notable dental hx. (+) Teeth Intact   Pulmonary sleep apnea and Continuous Positive Airway Pressure Ventilation ,    Pulmonary exam normal breath sounds clear to auscultation       Cardiovascular hypertension, Pt. on medications Normal cardiovascular exam Rhythm:Regular Rate:Normal     Neuro/Psych PSYCHIATRIC DISORDERS Mild MRnegative neurological ROS     GI/Hepatic   Endo/Other  diabetes, Well Controlled, Type 2, Oral Hypoglycemic AgentsObesity  Renal/GU   negative genitourinary   Musculoskeletal  (+) Arthritis , Osteoarthritis,  Right Patellar tendon rupture   Abdominal (+) + obese,   Peds  Hematology   Anesthesia Other Findings   Reproductive/Obstetrics                             Anesthesia Physical Anesthesia Plan  ASA: III  Anesthesia Plan: General   Post-op Pain Management:    Induction: Intravenous  PONV Risk Score and Plan: Treatment may vary due to age or medical condition and Ondansetron  Airway Management Planned: LMA  Additional Equipment:   Intra-op Plan:   Post-operative Plan: Extubation in OR  Informed Consent: I have reviewed the patients History and Physical, chart, labs and discussed the procedure including the risks, benefits and alternatives for the proposed anesthesia with the patient or authorized representative who has indicated his/her understanding and acceptance.     Dental advisory given  Plan Discussed with: CRNA and Surgeon  Anesthesia Plan Comments: (No Versed)        Anesthesia Quick Evaluation

## 2018-08-15 NOTE — Op Note (Signed)
   Date of Surgery: 08/15/2018  INDICATIONS: Mr. Cuadros is a 63 y.o.-year-old male with a subacute left patella tendon rupture;  The patient and family did consent to the procedure after discussion of the risks and benefits.  PREOPERATIVE DIAGNOSIS: Subacute left patella tendon rupture  POSTOPERATIVE DIAGNOSIS: Same.  PROCEDURE: Repair of left patella tendon  SURGEON: N. Eduard Roux, M.D.  ASSIST: Ciro Backer [place], [place]; necessary for the timely completion of procedure and due to complexity of procedure.  ANESTHESIA:  general, femoral block  IV FLUIDS AND URINE: See anesthesia.  ESTIMATED BLOOD LOSS: Minimal mL.  IMPLANTS: Arthrex 3.5 mm anchor x 2, Arthrex 4.75 mm anchor x 2  DRAINS: None  COMPLICATIONS: None.  DESCRIPTION OF PROCEDURE: The patient was brought to the operating room and placed supine on the operating table.  The patient had been signed prior to the procedure and this was documented. The patient had the anesthesia placed by the anesthesiologist.  A time-out was performed to confirm that this was the correct patient, site, side and location. The patient did receive antibiotics prior to the incision and was re-dosed during the procedure as needed at indicated intervals.  A tourniquet was placed.  The patient had the operative extremity prepped and draped in the standard surgical fashion.    An incision was made directly over the anterior aspect of the knee.  Dissection was carried through the subcutaneous tissue and full-thickness flaps were elevated both medially and laterally.  The patellar tendon rupture was then encountered.  Given the chronicity of the injury some heel he had already taken place.  The rupture appeared to be an intrasubstance rupture with the attachments to both the patella and the tibial tubercle intact.  The ruptured and the diseased tendon were all excised sharply.  Due to the fact that the rupture was intrasubstance I felt that it was best to  reinforce the repair with Arthrex anchors.  I first placed two 3.5 mm anchors into the patella at roughly the 8 o'clock and 4 o'clock position of the patella.  Each anchor had excellent fixation.  Care was taken not to violate either the dorsal or the ventral cortex.  I then placed two 4.75 mm anchors on the medial and lateral aspect of the tibial tubercle each with excellent fixation with the sutures brought out from the patellar anchors.  This was tensioned appropriately.  I was able to correct the patella alta.  The sutures were then cut short.  The intrasubstance rupture was then repaired with #2 FiberWire.  Gentle range of motion of the knee demonstrated no gapping of the repair site.  The surgical wound was then thoroughly irrigated with 3 L normal saline.  Layered closure was performed.  Sterile dressings were applied.  The leg was placed in a locked Bledsoe brace at 0 degrees.  Patient tolerated the procedure well had no immediate complications.  POSTOPERATIVE PLAN: Patient will be weight-bear as tolerated in the Bledsoe brace with crutches.  He is to follow-up in the office in 2 weeks for suture removal.  N. Eduard Roux, MD [facility] [phone] 9:51 AM

## 2018-08-15 NOTE — Anesthesia Postprocedure Evaluation (Signed)
Anesthesia Post Note  Patient: Elaine Middleton  Procedure(s) Performed: LEFT PATELLAR TENDON REPAIR (Left Knee)     Patient location during evaluation: PACU Anesthesia Type: General Level of consciousness: awake and alert and oriented Pain management: pain level controlled Vital Signs Assessment: post-procedure vital signs reviewed and stable Respiratory status: spontaneous breathing, nonlabored ventilation and respiratory function stable Cardiovascular status: blood pressure returned to baseline and stable Postop Assessment: no apparent nausea or vomiting Anesthetic complications: no    Last Vitals:  Vitals:   08/15/18 1115 08/15/18 1130  BP: 135/76 129/74  Pulse: 93 95  Resp: 20 16  Temp:    SpO2: 100% 99%    Last Pain:  Vitals:   08/15/18 1130  TempSrc:   PainSc: 5                  Deagan Sevin A.

## 2018-08-15 NOTE — Anesthesia Procedure Notes (Signed)
Anesthesia Regional Block: Femoral nerve block   Pre-Anesthetic Checklist: ,, timeout performed, Correct Patient, Correct Site, Correct Laterality, Correct Procedure, Correct Position, site marked, Risks and benefits discussed,  Surgical consent,  Pre-op evaluation,  At surgeon's request and post-op pain management  Laterality: Left  Prep: chloraprep       Needles:  Injection technique: Single-shot  Needle Type: Echogenic Stimulator Needle     Needle Length: 9cm  Needle Gauge: 21   Needle insertion depth: 7 cm   Additional Needles:   Procedures:,,,, ultrasound used (permanent image in chart),,,,  Narrative:  Start time: 08/15/2018 7:55 AM End time: 08/15/2018 8:00 AM Injection made incrementally with aspirations every 5 mL.  Performed by: Personally  Anesthesiologist: Josephine Igo, MD  Additional Notes: Timeout performed. Patient sedated. Relevant anatomy ID'd using [place]. Incremental 2-71ml injection of LA with frequent aspiration. Patient tolerated procedure well.        Left Femoral Nerve Block

## 2018-08-15 NOTE — Progress Notes (Signed)
Assisted Dr. Royce Macadamia with left, interscalene , femoral block. Side rails up, monitors on throughout procedure. See vital signs in flow sheet. Tolerated Procedure well.

## 2018-08-15 NOTE — Discharge Instructions (Signed)
Postoperative instructions:  Weightbearing instructions: as tolerated in knee brace  Dressing instructions: Keep your dressing and/or splint clean and dry at all times.  It will be removed at your first post-operative appointment.  Your stitches and/or staples will be removed at this visit.  Incision instructions:  Do not soak your incision for 3 weeks after surgery.  If the incision gets wet, pat dry and do not scrub the incision.  Pain control:  You have been given a prescription to be taken as directed for post-operative pain control.  In addition, elevate the operative extremity above the heart at all times to prevent swelling and throbbing pain.  Take over-the-counter Colace, 100mg  by mouth twice a day while taking narcotic pain medications to help prevent constipation.  Follow up appointments: 1) 12-14 days for suture removal and wound check. 2) Dr. Erlinda Hong as scheduled.   -------------------------------------------------------------------------------------------------------------  After Surgery Pain Control:  After your surgery, post-surgical discomfort or pain is likely. This discomfort can last several days to a few weeks. At certain times of the day your discomfort may be more intense.  Did you receive a nerve block?  A nerve block can provide pain relief for one hour to two days after your surgery. As long as the nerve block is working, you will experience little or no sensation in the area the surgeon operated on.  As the nerve block wears off, you will begin to experience pain or discomfort. It is very important that you begin taking your prescribed pain medication before the nerve block fully wears off. Treating your pain at the first sign of the block wearing off will ensure your pain is better controlled and more tolerable when full-sensation returns. Do not wait until the pain is intolerable, as the medicine will be less effective. It is better to treat pain in advance than to try  and catch up.  General Anesthesia:  If you did not receive a nerve block during your surgery, you will need to start taking your pain medication shortly after your surgery and should continue to do so as prescribed by your surgeon.  Pain Medication:  Most commonly we prescribe Vicodin and Percocet for post-operative pain. Both of these medications contain a combination of acetaminophen (Tylenol) and a narcotic to help control pain.   It takes between 30 and 45 minutes before pain medication starts to work. It is important to take your medication before your pain level gets too intense.   Nausea is a common side effect of many pain medications. You will want to eat something before taking your pain medicine to help prevent nausea.   If you are taking a prescription pain medication that contains acetaminophen, we recommend that you do not take additional over the counter acetaminophen (Tylenol).  Other pain relieving options:   Using a cold pack to ice the affected area a few times a day (15 to 20 minutes at a time) can help to relieve pain, reduce swelling and bruising.   Elevation of the affected area can also help to reduce pain and swelling.     Post Anesthesia Home Care Instructions  Activity: Get plenty of rest for the remainder of the day. A responsible individual must stay with you for 24 hours following the procedure.  For the next 24 hours, DO NOT: -Drive a car -Paediatric nurse -Drink alcoholic beverages -Take any medication unless instructed by your physician -Make any legal decisions or sign important papers.  Meals: Start with liquid foods such  as gelatin or soup. Progress to regular foods as tolerated. Avoid greasy, spicy, heavy foods. If nausea and/or vomiting occur, drink only clear liquids until the nausea and/or vomiting subsides. Call your physician if vomiting continues.  Special Instructions/Symptoms: Your throat may feel dry or sore from the anesthesia or  the breathing tube placed in your throat during surgery. If this causes discomfort, gargle with warm salt water. The discomfort should disappear within 24 hours.  If you had a scopolamine patch placed behind your ear for the management of post- operative nausea and/or vomiting:  1. The medication in the patch is effective for 72 hours, after which it should be removed.  Wrap patch in a tissue and discard in the trash. Wash hands thoroughly with soap and water. 2. You may remove the patch earlier than 72 hours if you experience unpleasant side effects which may include dry mouth, dizziness or visual disturbances. 3. Avoid touching the patch. Wash your hands with soap and water after contact with the patch.     Regional Anesthesia Blocks  1. Numbness or the inability to move the "blocked" extremity may last from 3-48 hours after placement. The length of time depends on the medication injected and your individual response to the medication. If the numbness is not going away after 48 hours, call your surgeon.  2. The extremity that is blocked will need to be protected until the numbness is gone and the  Strength has returned. Because you cannot feel it, you will need to take extra care to avoid injury. Because it may be weak, you may have difficulty moving it or using it. You may not know what position it is in without looking at it while the block is in effect.  3. For blocks in the legs and feet, returning to weight bearing and walking needs to be done carefully. You will need to wait until the numbness is entirely gone and the strength has returned. You should be able to move your leg and foot normally before you try and bear weight or walk. You will need someone to be with you when you first try to ensure you do not fall and possibly risk injury.  4. Bruising and tenderness at the needle site are common side effects and will resolve in a few days.  5. Persistent numbness or new problems with  movement should be communicated to the surgeon or the [facility] [phone] [place] [phone]).

## 2018-08-17 ENCOUNTER — Encounter (HOSPITAL_BASED_OUTPATIENT_CLINIC_OR_DEPARTMENT_OTHER): Payer: Self-pay | Admitting: Orthopaedic Surgery

## 2018-08-17 DIAGNOSIS — F319 Bipolar disorder, unspecified: Secondary | ICD-10-CM | POA: Diagnosis not present

## 2018-08-19 ENCOUNTER — Telehealth: Payer: Self-pay | Admitting: *Deleted

## 2018-08-19 NOTE — Telephone Encounter (Signed)
LM on sister-in-law's phone, this is the only contact number for patient.  LM to call us @ [phone] re: visit on 08/10/2018 @ [facility].

## 2018-08-20 DIAGNOSIS — G8918 Other acute postprocedural pain: Secondary | ICD-10-CM | POA: Diagnosis not present

## 2018-08-20 DIAGNOSIS — R2689 Other abnormalities of gait and mobility: Secondary | ICD-10-CM | POA: Diagnosis not present

## 2018-08-20 DIAGNOSIS — M6629 Spontaneous rupture of extensor tendons, multiple sites: Secondary | ICD-10-CM | POA: Diagnosis not present

## 2018-08-21 DIAGNOSIS — Z20828 Contact with and (suspected) exposure to other viral communicable diseases: Secondary | ICD-10-CM | POA: Diagnosis not present

## 2018-08-21 DIAGNOSIS — R2689 Other abnormalities of gait and mobility: Secondary | ICD-10-CM | POA: Diagnosis not present

## 2018-08-21 DIAGNOSIS — S8002XD Contusion of left knee, subsequent encounter: Secondary | ICD-10-CM | POA: Diagnosis not present

## 2018-08-21 DIAGNOSIS — M25562 Pain in left knee: Secondary | ICD-10-CM | POA: Diagnosis not present

## 2018-08-21 DIAGNOSIS — F319 Bipolar disorder, unspecified: Secondary | ICD-10-CM | POA: Diagnosis not present

## 2018-08-21 DIAGNOSIS — M6281 Muscle weakness (generalized): Secondary | ICD-10-CM | POA: Diagnosis not present

## 2018-08-24 DIAGNOSIS — G894 Chronic pain syndrome: Secondary | ICD-10-CM | POA: Diagnosis not present

## 2018-08-27 ENCOUNTER — Ambulatory Visit (INDEPENDENT_AMBULATORY_CARE_PROVIDER_SITE_OTHER): Payer: Federal, State, Local not specified - PPO | Admitting: Physician Assistant

## 2018-08-27 ENCOUNTER — Other Ambulatory Visit: Payer: Self-pay

## 2018-08-27 ENCOUNTER — Encounter: Payer: Self-pay | Admitting: Physician Assistant

## 2018-08-27 DIAGNOSIS — S86812D Strain of other muscle(s) and tendon(s) at lower leg level, left leg, subsequent encounter: Secondary | ICD-10-CM

## 2018-08-27 NOTE — Progress Notes (Signed)
HPI: [name] patient of Dr. Erlinda Brown returns today 12 days status post left patellar tendon repair.  Patient is at [facility] facility states overall is doing well.  Said no chest pain or shortness of breath.  He is working with therapy has had bedside brace locked out in full extension.  Physical exam: General well-developed well-nourished male no acute distress mood and affect appropriate.  Psych alert and oriented x3.  Left leg calf supple nontender.  Surgical incisions well approximated with a running subcutaneous suture.  No signs of dehiscence or infection.  Dorsiflexion plantarflexion ankle intact.  Impression: 12 days status post left patellar tendon repair.  Plan: Dressing was changed Steri-Strips applied.  He is able to get the incision wet in shower no submerging of the wound.  He will continue work with physical therapy for strengthening ,gait and balance.  Bledsoe brace was locked out at full extension and 20 degrees of flexion.  Questions encouraged and answered.  Weightbearing as tolerated on the left leg.  Follow-up with Dr. Erlinda Brown in 2 weeks

## 2018-08-28 DIAGNOSIS — G8918 Other acute postprocedural pain: Secondary | ICD-10-CM | POA: Diagnosis not present

## 2018-08-28 DIAGNOSIS — M238X2 Other internal derangements of left knee: Secondary | ICD-10-CM | POA: Diagnosis not present

## 2018-08-28 DIAGNOSIS — M25562 Pain in left knee: Secondary | ICD-10-CM | POA: Diagnosis not present

## 2018-08-29 ENCOUNTER — Inpatient Hospital Stay: Payer: Federal, State, Local not specified - PPO | Admitting: Orthopaedic Surgery

## 2018-08-29 DIAGNOSIS — F319 Bipolar disorder, unspecified: Secondary | ICD-10-CM | POA: Diagnosis not present

## 2018-09-04 DIAGNOSIS — F319 Bipolar disorder, unspecified: Secondary | ICD-10-CM | POA: Diagnosis not present

## 2018-09-07 DIAGNOSIS — M7051 Other bursitis of knee, right knee: Secondary | ICD-10-CM | POA: Diagnosis not present

## 2018-09-07 DIAGNOSIS — F319 Bipolar disorder, unspecified: Secondary | ICD-10-CM | POA: Diagnosis not present

## 2018-09-11 ENCOUNTER — Ambulatory Visit (INDEPENDENT_AMBULATORY_CARE_PROVIDER_SITE_OTHER): Payer: Federal, State, Local not specified - PPO | Admitting: Physician Assistant

## 2018-09-11 ENCOUNTER — Encounter: Payer: Self-pay | Admitting: Orthopaedic Surgery

## 2018-09-11 ENCOUNTER — Other Ambulatory Visit: Payer: Self-pay

## 2018-09-11 DIAGNOSIS — S86812D Strain of other muscle(s) and tendon(s) at lower leg level, left leg, subsequent encounter: Secondary | ICD-10-CM

## 2018-09-11 NOTE — Progress Notes (Signed)
Post-Op Visit Note   Patient: Alejandro Brown           Date of Birth: 23-Sep-1955           MRN: [id number] Visit Date: 09/11/2018 PCP: Binnie Rail, MD   Assessment & Plan:  Chief Complaint:  Chief Complaint  Patient presents with  . Left Leg - Follow-up   Visit Diagnoses:  1. Patellar tendon rupture, left, subsequent encounter     Plan: Patient is a pleasant 63 year old gentleman who is a resident of [facility] [facility] who presents to our clinic today for follow-up of his left patellar tendon repair, date of surgery 08/15/2018.  He has been doing fairly well.  He has been compliant wearing his hinged knee brace locked from 0 to 20 degrees.  He has minimal to no pain.  He is getting PT daily when he is ambulating with the assistance of a walker.  Examination of his left knee reveals a fully healed surgical incision.  No evidence of infection or cellulitis.  He does have moderate swelling to bilateral lower extremities.  Calf soft and nontender.  He is neurovascularly intact distally.  Today, I unlocked his brace to 60 degrees of flexion.  He will continue wearing this at all times.  He will continue with physical therapy.  He will follow-up with [place] in 4 weeks time where I anticipate unlocking this to 90 degrees of flexion.  Will call with concerns or questions in the meantime. Follow-Up Instructions: Return in about 4 weeks (around 10/09/2018).   Orders:  No orders of the defined types were placed in this encounter.  No orders of the defined types were placed in this encounter.   Imaging: No new imaging  PMFS History: Patient Active Problem List   Diagnosis Date Noted  . Patellar tendon rupture, left, initial encounter 08/15/2018  . Right shoulder pain 07/09/2018  . Umbilical hernia 88/41/6606  . Enlarged prostate 06/12/2015  . Elevated PSA 06/12/2015  . Ventral hernia 06/12/2015  . HTN (hypertension) 08/31/2010  . Diabetes type 2, controlled ([place]) 08/31/2010  . Obesity  08/31/2010  . Mental retardation 08/31/2010  . Dependent edema 08/31/2010  . Obstructive sleep apnea 05/05/2007  . ALLERGIC RHINITIS 05/04/2007   Past Medical History:  Diagnosis Date  . ALLERGIC RHINITIS   . Allergy   . Arthritis   . Dependent edema    chronic L>R  . Diverticulosis of colon   . GERD (gastroesophageal reflux disease)   . Hypertension   . Mental retardation    mild  . Obesity   . OBSTRUCTIVE SLEEP APNEA    noncompliant with CPAP  . Rupture of left patellar tendon   . Sleep apnea   . Type II or unspecified type diabetes mellitus without mention of complication, not stated as uncontrolled    diet controlled  . Umbilical hernia     Family History  Problem Relation Age of Onset  . Breast cancer Sister   . Breast cancer Other   . Colon cancer Neg Hx   . Esophageal cancer Neg Hx   . Rectal cancer Neg Hx   . Stomach cancer Neg Hx     Past Surgical History:  Procedure Laterality Date  . HERNIA REPAIR    . NASAL SEPTUM SURGERY    . PATELLAR TENDON REPAIR Left 08/15/2018   Procedure: LEFT PATELLAR TENDON REPAIR;  Surgeon: Leandrew Koyanagi, MD;  Location: [place];  Service: Orthopedics;  Laterality: Left;  .  ROTATOR CUFF REPAIR Left 06/2014   [name]   Social History   Occupational History  . 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 Smoking status: Never Smoker  . Smokeless tobacco: Never Used  Substance and Sexual Activity  . Alcohol use: No    Alcohol/week: 0.0 standard drinks  . Drug use: No  . Sexual activity: Not on file

## 2018-09-12 ENCOUNTER — Other Ambulatory Visit: Payer: Self-pay | Admitting: Internal Medicine

## 2018-09-14 DIAGNOSIS — M6281 Muscle weakness (generalized): Secondary | ICD-10-CM | POA: Diagnosis not present

## 2018-09-14 DIAGNOSIS — S8002XD Contusion of left knee, subsequent encounter: Secondary | ICD-10-CM | POA: Diagnosis not present

## 2018-09-14 DIAGNOSIS — R2689 Other abnormalities of gait and mobility: Secondary | ICD-10-CM | POA: Diagnosis not present

## 2018-09-14 DIAGNOSIS — M25562 Pain in left knee: Secondary | ICD-10-CM | POA: Diagnosis not present

## 2018-09-18 DIAGNOSIS — R2689 Other abnormalities of gait and mobility: Secondary | ICD-10-CM | POA: Diagnosis not present

## 2018-09-18 DIAGNOSIS — M25562 Pain in left knee: Secondary | ICD-10-CM | POA: Diagnosis not present

## 2018-09-18 DIAGNOSIS — M6281 Muscle weakness (generalized): Secondary | ICD-10-CM | POA: Diagnosis not present

## 2018-09-18 DIAGNOSIS — S8002XD Contusion of left knee, subsequent encounter: Secondary | ICD-10-CM | POA: Diagnosis not present

## 2018-09-20 DIAGNOSIS — R102 Pelvic and perineal pain: Secondary | ICD-10-CM | POA: Diagnosis not present

## 2018-09-21 DIAGNOSIS — I82413 Acute embolism and thrombosis of femoral vein, bilateral: Secondary | ICD-10-CM | POA: Diagnosis not present

## 2018-09-21 DIAGNOSIS — I82411 Acute embolism and thrombosis of right femoral vein: Secondary | ICD-10-CM | POA: Diagnosis not present

## 2018-09-21 DIAGNOSIS — F319 Bipolar disorder, unspecified: Secondary | ICD-10-CM | POA: Diagnosis not present

## 2018-09-25 DIAGNOSIS — M1711 Unilateral primary osteoarthritis, right knee: Secondary | ICD-10-CM | POA: Diagnosis not present

## 2018-09-27 DIAGNOSIS — E039 Hypothyroidism, unspecified: Secondary | ICD-10-CM | POA: Diagnosis not present

## 2018-09-27 DIAGNOSIS — E119 Type 2 diabetes mellitus without complications: Secondary | ICD-10-CM | POA: Diagnosis not present

## 2018-09-27 DIAGNOSIS — I10 Essential (primary) hypertension: Secondary | ICD-10-CM | POA: Diagnosis not present

## 2018-09-27 DIAGNOSIS — D649 Anemia, unspecified: Secondary | ICD-10-CM | POA: Diagnosis not present

## 2018-10-04 DIAGNOSIS — F319 Bipolar disorder, unspecified: Secondary | ICD-10-CM | POA: Diagnosis not present

## 2018-10-12 ENCOUNTER — Ambulatory Visit (INDEPENDENT_AMBULATORY_CARE_PROVIDER_SITE_OTHER): Payer: Federal, State, Local not specified - PPO | Admitting: Physician Assistant

## 2018-10-12 ENCOUNTER — Encounter: Payer: Self-pay | Admitting: Physician Assistant

## 2018-10-12 DIAGNOSIS — S86812D Strain of other muscle(s) and tendon(s) at lower leg level, left leg, subsequent encounter: Secondary | ICD-10-CM

## 2018-10-12 NOTE — Progress Notes (Signed)
Post-Op Visit Note   Patient: Alejandro Brown           Date of Birth: 12-14-1955           MRN: [id number] Visit Date: 10/12/2018 PCP: Binnie Rail, MD   Assessment & Plan:  Chief Complaint: No chief complaint on file.  Visit Diagnoses:  1. Patellar tendon rupture, left, subsequent encounter     Plan: Patient is a pleasant 63 year old gentleman who presents our clinic today 20 days status post left talar tendon repair, date of surgery 08/15/2018.  He is currently residing at [facility].  He does note that he has been getting formal physical therapy a few days a week.  He has been compliant wearing his Bledsoe locked from 0 to 60 degrees.  He has increased pain at night but really not much during the day.  Overall doing better examination of his left knee reveals a fully healed surgical scar without evidence of infection or cellulitis.  He still has a fair amount of weakness with straight leg raise as he is barely able to lift his heel off the ground.  Calf is soft nontender.  At this point, I have unlocked his brace to 90 degrees of flexion.  He will continue with formal physical therapy at [facility].  [place] paperwork was filled out today by me.  He will follow-up with [place] in 4 weeks time for repeat evaluation.  I anticipate fully unlocking his brace at that point.  Call with concerns or questions in meantime.  Follow-Up Instructions: Return in about 4 weeks (around 11/09/2018).   Orders:  No orders of the defined types were placed in this encounter.  No orders of the defined types were placed in this encounter.   Imaging: No new imaging  PMFS History: Patient Active Problem List   Diagnosis Date Noted  . Patellar tendon rupture, left, initial encounter 08/15/2018  . Right shoulder pain 07/09/2018  . Umbilical hernia 93/71/6967  . Enlarged prostate 06/12/2015  . Elevated PSA 06/12/2015  . Ventral hernia 06/12/2015  . HTN (hypertension) 08/31/2010  . Diabetes  type 2, controlled ([place]) 08/31/2010  . Obesity 08/31/2010  . Mental retardation 08/31/2010  . Dependent edema 08/31/2010  . Obstructive sleep apnea 05/05/2007  . ALLERGIC RHINITIS 05/04/2007   Past Medical History:  Diagnosis Date  . ALLERGIC RHINITIS   . Allergy   . Arthritis   . Dependent edema    chronic L>R  . Diverticulosis of colon   . GERD (gastroesophageal reflux disease)   . Hypertension   . Mental retardation    mild  . Obesity   . OBSTRUCTIVE SLEEP APNEA    noncompliant with CPAP  . Rupture of left patellar tendon   . Sleep apnea   . Type II or unspecified type diabetes mellitus without mention of complication, not stated as uncontrolled    diet controlled  . Umbilical hernia     Family History  Problem Relation Age of Onset  . Breast cancer Sister   . Breast cancer Other   . Colon cancer Neg Hx   . Esophageal cancer Neg Hx   . Rectal cancer Neg Hx   . Stomach cancer Neg Hx     Past Surgical History:  Procedure Laterality Date  . HERNIA REPAIR    . NASAL SEPTUM SURGERY    . PATELLAR TENDON REPAIR Left 08/15/2018   Procedure: LEFT PATELLAR TENDON REPAIR;  Surgeon: Leandrew Koyanagi, MD;  Location:  [facility];  Service: Orthopedics;  Laterality: Left;  . ROTATOR CUFF REPAIR Left 06/2014   [name]   Social History   Occupational History  . 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 Smoking status: Never Smoker  . Smokeless tobacco: Never Used  Substance and Sexual Activity  . Alcohol use: No    Alcohol/week: 0.0 standard drinks  . Drug use: No  . Sexual activity: Not on file

## 2018-10-17 DIAGNOSIS — F5109 Other insomnia not due to a substance or known physiological condition: Secondary | ICD-10-CM | POA: Diagnosis not present

## 2018-10-17 DIAGNOSIS — F70 Mild intellectual disabilities: Secondary | ICD-10-CM | POA: Diagnosis not present

## 2018-10-17 DIAGNOSIS — F319 Bipolar disorder, unspecified: Secondary | ICD-10-CM | POA: Diagnosis not present

## 2018-10-18 DIAGNOSIS — F5109 Other insomnia not due to a substance or known physiological condition: Secondary | ICD-10-CM | POA: Diagnosis not present

## 2018-10-31 NOTE — Progress Notes (Signed)
Virtual Visit via Video Note  I connected with Alejandro Brown on 11/01/18 at  9:30 AM EDT by a video enabled telemedicine application and verified that I am speaking with the correct person using two identifiers.   I discussed the limitations of evaluation and management by telemedicine and the availability of in person appointments. The patient expressed understanding and agreed to proceed.  The patient is currently at home and I am in the office.  Someone from [place] is with him and helps with the history and vital signs.  No referring provider.    History of Present Illness: This is an acute visit for feet swelling and blistering on feet.  He had repair of a patellar tendon in May of this year.  Since then he has had increased bilateral lower leg swelling.  He states he has been sitting more, but he does elevate his legs sometimes.  Logistically it is hard to elevate his legs, but his brother is going to get him a recliner this weekend.  He has not been wearing his compression sock on the left leg.  Both legs have been swollen, but 2 days ago for blisters developed on his right foot.  The largest blister is the size of a quarter and on the dorsal aspect at the base of his toes.  He denies any trauma to the foot.  The nurse who is present states that his feet have pitting edema in both lower legs are swollen.  He did have a DVT in his right lower extremity and was diagnosed at rehab.  He was started on Eliquis 5 mg twice daily, which he continues to take.  I did not receive any documentation of this.  He is following with orthopedics.  He takes Lasix 20 mg daily.  He denies any shortness of breath, chest pain or palpitations.  He has not had any fevers.  He has not been sleeping well.  He has been taking Ambien or tramadol as needed for sleep.  Looks like this may have been prescribed by rehab.  He still has difficulty sleeping despite the medication.  He sometimes sleeps during  the day for an hour.  When he wakes up at night, which is often it is not because of pain or needing to go to the bathroom.  Once he wakes up it is difficult for him to go back to sleep.   Review of Systems  Constitutional: Negative for fever.  Respiratory: Negative for cough, shortness of breath and wheezing.   Cardiovascular: Positive for leg swelling. Negative for chest pain and palpitations.      Social History   Socioeconomic History  . Marital status: Single    Spouse name: n/a  . Number of children: 0  . Years of education: 12th grade  . Highest education level: Not on file  Occupational History  . 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 Financial resource strain: Not on file  . Food insecurity    Worry: Not on file    Inability: Not on file  . Transportation needs    Medical: Not on file    Non-medical: Not on file  Tobacco Use  . Smoking status: Never Smoker  . Smokeless tobacco: Never Used  Substance and Sexual Activity  . Alcohol use: No    Alcohol/week: 0.0 standard drinks  . Drug use: No  . Sexual activity: Not on file  Lifestyle  . Physical activity  Days per week: Not on file    Minutes per session: Not on file  . Stress: Not on file  Relationships  . Social Herbalist on phone: Not on file    Gets together: Not on file    Attends religious service: Not on file    Active member of club or organization: Not on file    Attends meetings of clubs or organizations: Not on file    Relationship status: Not on file  Other Topics Concern  . 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     Observations/Objective: Appears well in NAD  97.2 temp, RR 16 94% on RA, BP 120/72, HR 94-110  His heart rate has been consistently elevated.  The nurse states it sounds regular.  Assessment and Plan:  See Problem List for Assessment and  Plan of chronic medical problems.   Follow Up Instructions:    I discussed the assessment and treatment plan with the patient. The patient was provided an opportunity to ask questions and all were answered. The patient agreed with the plan and demonstrated an understanding of the instructions.   The patient was advised to call back or seek an in-person evaluation if the symptoms worsen or if the condition fails to improve as anticipated.    Binnie Rail, MD

## 2018-11-01 ENCOUNTER — Ambulatory Visit (INDEPENDENT_AMBULATORY_CARE_PROVIDER_SITE_OTHER): Payer: Federal, State, Local not specified - PPO | Admitting: Internal Medicine

## 2018-11-01 ENCOUNTER — Encounter: Payer: Self-pay | Admitting: Internal Medicine

## 2018-11-01 DIAGNOSIS — I10 Essential (primary) hypertension: Secondary | ICD-10-CM | POA: Diagnosis not present

## 2018-11-01 DIAGNOSIS — R609 Edema, unspecified: Secondary | ICD-10-CM

## 2018-11-01 DIAGNOSIS — R000 Tachycardia, unspecified: Secondary | ICD-10-CM | POA: Insufficient documentation

## 2018-11-01 MED ORDER — APIXABAN 5 MG PO TABS: 5.0000 mg | ORAL_TABLET | Freq: Two times a day (BID) | ORAL | Status: DC

## 2018-11-01 NOTE — Assessment & Plan Note (Signed)
Heart rate has been persistently elevated when checked where he lives The nurse states it does sound regular Heart rate 94-110 No obvious cause He is on a blood thinner for his right lower extremity DVT Reviewed medication-none of his medication should cause an elevated heart rate Will have an EKG done where he lives

## 2018-11-01 NOTE — Assessment & Plan Note (Signed)
He has chronic edema-left leg is worse than right because of history of cellulitis twice in the left lower extremity He has not been wearing his compression hose on the left leg-typically does not wear it on the right leg Takes 20 mg of Lasix daily With the increased swelling in his feet and legs and blisters, which look like they are resolved of the swelling we will increase the Lasix to 40 mg daily for 2 days and then return to 20 mg daily-he may need a higher dose longer, but he will let me know how he responds to the higher dose for a couple of days Elevate legs consistently-getting in the recliner will likely help with this Wear compression socks once able on a daily basis-he should wear these on both legs Low sodium diet

## 2018-11-01 NOTE — Patient Instructions (Signed)
For leg swelling: Increase Lasix to 40 mg daily for 2 days and then return to 20 mg daily.  Elevate legs whenever sitting.  Make sure you are walking around enough because that also helps with leg swelling.  Once able wear compression socks on both legs daily.  Low-sodium diet.  Depending on swelling we can increase Lasix again if needed.  For elevated heart rate: EKG to be done at [facility].

## 2018-11-01 NOTE — Assessment & Plan Note (Signed)
Blood pressure well controlled Continue ramipril

## 2018-11-02 ENCOUNTER — Encounter: Payer: Self-pay | Admitting: Internal Medicine

## 2018-11-06 ENCOUNTER — Telehealth: Payer: Self-pay | Admitting: Internal Medicine

## 2018-11-06 NOTE — Telephone Encounter (Signed)
OV

## 2018-11-06 NOTE — Telephone Encounter (Signed)
Office visit would be more ideal, but can do virtual if needed

## 2018-11-06 NOTE — Telephone Encounter (Signed)
Copied from [place] [id number]. Topic: General - Inquiry >> Nov 06, 2018 10:39 AM Richardo Priest, NT wrote: Reason for CRM: Anderson [place] called in in regards to patient's blisters he has/had on his legs. Stated some have popped and she is wondering what the next step in his plan of care would be, whether it be a virtual/in office visit. Please advise and call back is [phone].

## 2018-11-06 NOTE — Telephone Encounter (Signed)
Appointment made

## 2018-11-06 NOTE — Progress Notes (Signed)
Subjective:    Patient ID: Alejandro Brown, male    DOB: 1955-12-19, 63 y.o.   MRN: [id number]  HPI The patient is here for an acute visit.   Leg swelling, blisters:  He is elevating his legs.  He is sedentary because he is still recovering knee surgery.  She is doing PT twice a week.  He is wearing his lef leg compression sock and that leg is back to normal.  One of the blisters on top of the right foot has started leaking one week ago - it is clear fluid with a little blood in it.  There is no pus.  He denies pain.   He denies fever or chills.  He is compliant with a low sodium diet.  He denies SOB, chest pain or palpitations.  He did not feel the 40 mg of lasix for two days helped his swelling.     Hypertension: He is taking his medication daily. He is compliant with a low sodium diet.  He denies chest pain, palpitations, shortness of breath and regular headaches.      Diabetes: He is controlling his sugars with lifestyle. He is compliant with a diabetic diet.      Medications and allergies reviewed with patient and updated if appropriate.  Patient Active Problem List   Diagnosis Date Noted  . Tachycardia 11/01/2018  . Patellar tendon rupture, left, initial encounter 08/15/2018  . Right shoulder pain 07/09/2018  . Umbilical hernia 44/31/5400  . Enlarged prostate 06/12/2015  . Elevated PSA 06/12/2015  . Ventral hernia 06/12/2015  . HTN (hypertension) 08/31/2010  . Diabetes type 2, controlled ([place]) 08/31/2010  . Obesity 08/31/2010  . Mental retardation 08/31/2010  . Dependent edema 08/31/2010  . Obstructive sleep apnea 05/05/2007  . ALLERGIC RHINITIS 05/04/2007    Current Outpatient Medications on File Prior to Visit  Medication Sig Dispense Refill  . apixaban (ELIQUIS) 5 MG TABS tablet Take 1 tablet (5 mg total) by mouth 2 (two) times daily. 60 tablet   . Calcium Polycarbophil (FIBER) 625 MG TABS Taking one daily (Patient taking differently: Take 625 mg by mouth  daily. Taking one daily) 14 each 0  . Cholecalciferol (VITAMIN D3) 50 MCG (2000 UT) TABS Take by mouth.    Water engineer Bandages & Supports (MEDICAL COMPRESSION SOCKS) MISC Medium compression 20-30 mm Hg for chronic leg edema 4 each 0  . Ensure (ENSURE) Take 237 mLs by mouth.    . furosemide (LASIX) 20 MG tablet TAKE 1 TABLET BY MOUTH EVERY DAY (Patient taking differently: Take 20 mg by mouth daily. ) 90 tablet 1  . HYDROcodone-acetaminophen (NORCO) 10-325 MG tablet Take 1 tablet by mouth every 6 (six) hours as needed.    . multivitamin (ONE-A-DAY MEN'S) TABS tablet Take 1 tablet by mouth daily.  0  . polyethylene glycol (MIRALAX / GLYCOLAX) 17 g packet Take 17 g by mouth daily.    . ramipril (ALTACE) 5 MG capsule Take 1 capsule (5 mg total) by mouth daily. Need office visit for more refills. 30 capsule 0  . traMADol (ULTRAM) 50 MG tablet Take 50 mg by mouth every 6 (six) hours as needed.     No current facility-administered medications on file prior to visit.     Past Medical History:  Diagnosis Date  . ALLERGIC RHINITIS   . Allergy   . Arthritis   . Dependent edema    chronic L>R  . Diverticulosis of colon   . GERD (  gastroesophageal reflux disease)   . Hypertension   . Mental retardation    mild  . Obesity   . OBSTRUCTIVE SLEEP APNEA    noncompliant with CPAP  . Rupture of left patellar tendon   . Sleep apnea   . Type II or unspecified type diabetes mellitus without mention of complication, not stated as uncontrolled    diet controlled  . Umbilical hernia     Past Surgical History:  Procedure Laterality Date  . HERNIA REPAIR    . NASAL SEPTUM SURGERY    . PATELLAR TENDON REPAIR Left 08/15/2018   Procedure: LEFT PATELLAR TENDON REPAIR;  Surgeon: Leandrew Koyanagi, MD;  Location: [facility];  Service: Orthopedics;  Laterality: Left;  . ROTATOR CUFF REPAIR Left 06/2014   [name]    Social History   Socioeconomic History  . Marital status: Single    Spouse name:  n/a  . Number of children: 0  . Years of education: 12th grade  . Highest education level: Not on file  Occupational History  . 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 Financial resource strain: Not on file  . Food insecurity    Worry: Not on file    Inability: Not on file  . Transportation needs    Medical: Not on file    Non-medical: Not on file  Tobacco Use  . Smoking status: Never Smoker  . Smokeless tobacco: Never Used  Substance and Sexual Activity  . Alcohol use: No    Alcohol/week: 0.0 standard drinks  . Drug use: No  . Sexual activity: Not on file  Lifestyle  . Physical activity    Days per week: Not on file    Minutes per session: Not on file  . Stress: Not on file  Relationships  . Social Herbalist on phone: Not on file    Gets together: Not on file    Attends religious service: Not on file    Active member of club or organization: Not on file    Attends meetings of clubs or organizations: Not on file    Relationship status: Not on file  Other Topics Concern  . 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    Family History  Problem Relation Age of Onset  . Breast cancer Sister   . Breast cancer Other   . Colon cancer Neg Hx   . Esophageal cancer Neg Hx   . Rectal cancer Neg Hx   . Stomach cancer Neg Hx     Review of Systems  Constitutional: Negative for chills and fever.  Respiratory: Negative for cough, shortness of breath and wheezing.   Cardiovascular: Positive for leg swelling. Negative for chest pain and palpitations.  Neurological: Negative for light-headedness and headaches.       Objective:   Vitals:   11/07/18 1342  BP: 110/80  Pulse: 76  Temp: 98.4 F (36.9 C)  SpO2: 98%   BP Readings from Last 3 Encounters:  11/07/18 110/80  08/15/18 (!) 142/82  07/19/18 132/82   Wt Readings from Last 3 Encounters:   11/07/18 245 lb (111.1 kg)  08/15/18 225 lb 1.4 oz (102.1 kg)  01/15/18 244 lb (110.7 kg)   Body mass index is 40.77 kg/m.   Physical Exam    Constitutional: Appears well-developed and well-nourished. No distress.  HENT:  Head: Normocephalic and atraumatic.  Neck: Neck supple. No tracheal deviation present. No thyromegaly present.  No cervical lymphadenopathy Cardiovascular: Normal rate, regular rhythm and normal heart sounds.   No murmur heard. No carotid bruit .  Left leg edema - wearing compression sock.  Right foot and lower leg swelling 1 + with 4-5 fluid filled blisters on top and lateral aspect of foot.  Blister on top is oozing clear-blood tinged fluid - no pain except for with manipulation to help drain  Pulmonary/Chest: Effort normal and breath sounds normal. No respiratory distress. No has no wheezes. No rales.  Skin: Skin is warm and dry. Not diaphoretic. no erythema of RLE Psychiatric: Normal mood and affect. Behavior is normal.       Assessment & Plan:    See Problem List for Assessment and Plan of chronic medical problems.

## 2018-11-07 ENCOUNTER — Ambulatory Visit (INDEPENDENT_AMBULATORY_CARE_PROVIDER_SITE_OTHER): Payer: Federal, State, Local not specified - PPO | Admitting: Internal Medicine

## 2018-11-07 ENCOUNTER — Encounter: Payer: Self-pay | Admitting: Internal Medicine

## 2018-11-07 ENCOUNTER — Other Ambulatory Visit (INDEPENDENT_AMBULATORY_CARE_PROVIDER_SITE_OTHER): Payer: Federal, State, Local not specified - PPO

## 2018-11-07 ENCOUNTER — Other Ambulatory Visit: Payer: Self-pay

## 2018-11-07 VITALS — BP 110/80 | HR 76 | Temp 98.4°F | Ht 65.0 in | Wt 245.0 lb

## 2018-11-07 DIAGNOSIS — R609 Edema, unspecified: Secondary | ICD-10-CM

## 2018-11-07 DIAGNOSIS — Z86718 Personal history of other venous thrombosis and embolism: Secondary | ICD-10-CM | POA: Insufficient documentation

## 2018-11-07 DIAGNOSIS — I10 Essential (primary) hypertension: Secondary | ICD-10-CM

## 2018-11-07 DIAGNOSIS — E119 Type 2 diabetes mellitus without complications: Secondary | ICD-10-CM | POA: Diagnosis not present

## 2018-11-07 DIAGNOSIS — I824Z1 Acute embolism and thrombosis of unspecified deep veins of right distal lower extremity: Secondary | ICD-10-CM | POA: Diagnosis not present

## 2018-11-07 DIAGNOSIS — I82409 Acute embolism and thrombosis of unspecified deep veins of unspecified lower extremity: Secondary | ICD-10-CM | POA: Insufficient documentation

## 2018-11-07 LAB — COMPREHENSIVE METABOLIC PANEL
ALT: 17 U/L (ref 0–53)
AST: 13 U/L (ref 0–37)
Albumin: 3.6 g/dL (ref 3.5–5.2)
Alkaline Phosphatase: 77 U/L (ref 39–117)
BUN: 7 mg/dL (ref 6–23)
CO2: 29 mEq/L (ref 19–32)
Calcium: 9.1 mg/dL (ref 8.4–10.5)
Chloride: 99 mEq/L (ref 96–112)
Creatinine, Ser: 0.77 mg/dL (ref 0.40–1.50)
GFR: 101.46 mL/min (ref >60.00)
Glucose, Bld: 95 mg/dL (ref 70–99)
Potassium: 3.9 mEq/L (ref 3.5–5.1)
Sodium: 136 mEq/L (ref 135–145)
Total Bilirubin: 0.7 mg/dL (ref 0.2–1.2)
Total Protein: 6.8 g/dL (ref 6.0–8.3)

## 2018-11-07 LAB — HEMOGLOBIN A1C: Hgb A1c MFr Bld: 6.1 % (ref 4.6–6.5)

## 2018-11-07 LAB — CBC WITH DIFFERENTIAL/PLATELET
Basophils Absolute: 0 10*3/uL (ref 0.0–0.1)
Basophils Relative: 0.2 % (ref 0.0–3.0)
Eosinophils Absolute: 0.2 10*3/uL (ref 0.0–0.7)
Eosinophils Relative: 2.2 % (ref 0.0–5.0)
HCT: 41.4 % (ref 39.0–52.0)
Hemoglobin: 13.5 g/dL (ref 13.0–17.0)
Lymphocytes Relative: 18 % (ref 12.0–46.0)
Lymphs Abs: 1.9 10*3/uL (ref 0.7–4.0)
MCHC: 32.7 g/dL (ref 30.0–36.0)
MCV: 87.2 fl (ref 78.0–100.0)
Monocytes Absolute: 0.7 10*3/uL (ref 0.1–1.0)
Monocytes Relative: 6.9 % (ref 3.0–12.0)
Neutro Abs: 7.7 10*3/uL (ref 1.4–7.7)
Neutrophils Relative %: 72.7 % (ref 43.0–77.0)
Platelets: 318 10*3/uL (ref 150.0–400.0)
RBC: 4.74 Mil/uL (ref 4.22–5.81)
RDW: 14.3 % (ref 11.5–15.5)
WBC: 10.7 10*3/uL — ABNORMAL HIGH (ref 4.0–10.5)

## 2018-11-07 LAB — LIPID PANEL
Cholesterol: 163 mg/dL (ref 0–200)
HDL: 54 mg/dL (ref >39.00)
LDL Cholesterol: 95 mg/dL (ref 0–99)
NonHDL: 108.96
Total CHOL/HDL Ratio: 3
Triglycerides: 68 mg/dL (ref 0.0–149.0)
VLDL: 13.6 mg/dL (ref 0.0–40.0)

## 2018-11-07 MED ORDER — FUROSEMIDE 40 MG PO TABS: 40.0000 mg | ORAL_TABLET | Freq: Every day | ORAL | 1 refills | Status: DC

## 2018-11-07 MED ORDER — ESZOPICLONE 2 MG PO TABS: 2.0000 mg | ORAL_TABLET | Freq: Every evening | ORAL | 1 refills | Status: DC | PRN

## 2018-11-07 NOTE — Assessment & Plan Note (Signed)
BP well controlled Current regimen effective and well tolerated Continue current medications at current doses cmp  

## 2018-11-07 NOTE — Patient Instructions (Addendum)
  Tests ordered today. Your results will be released to [place] (or called to you) after review.  If any changes need to be made, you will be notified at that same time.  .   Medications reviewed and updated.  Changes include :   Increase lasix 40 mg daily.  Stop ambien.  Try lunesta 2 mg at night for sleep - take as needed.  Ideally this should be short term.   Your prescription(s) have been submitted to your pharmacy. Please take as directed and contact our office if you believe you are having problem(s) with the medication(s).

## 2018-11-07 NOTE — Assessment & Plan Note (Signed)
Chronic LLE edema due to cellulitis x 2 in the past - back to wearing compression sock and will continue to wear it daily RLE edema - worse since surgery and DVT in RLE on eliquis Has blisters on right foot - fluid filled likely from edema No change with lasix 40 mg x 2 per him Will increase lasix to 40 mg daily Elevate legs Advised regular exercise/PT compression sock on right leg once able - blisters go down He does not want the blisters to be drained No evidence of an infection - no other treatment needed  cmp

## 2018-11-07 NOTE — Assessment & Plan Note (Signed)
On eliquis x 3 months Check cmp, cbc

## 2018-11-07 NOTE — Assessment & Plan Note (Addendum)
Diet controlled Check a1c, lipid

## 2018-11-08 ENCOUNTER — Telehealth: Payer: Self-pay | Admitting: Internal Medicine

## 2018-11-08 ENCOUNTER — Other Ambulatory Visit: Payer: Self-pay

## 2018-11-08 ENCOUNTER — Encounter: Payer: Self-pay | Admitting: Internal Medicine

## 2018-11-08 MED ORDER — ESZOPICLONE 2 MG PO TABS: 2.0000 mg | ORAL_TABLET | Freq: Every evening | ORAL | 1 refills | Status: DC | PRN

## 2018-11-08 NOTE — Telephone Encounter (Signed)
error 

## 2018-11-08 NOTE — Telephone Encounter (Signed)
eszopiclone (LUNESTA) 2 MG TABS tablet  [facility] [address] [place]  [address] Ph [phone] Fax [phone]  [facility] canceled this yesterday. Pt is [employer] [employer] and not supposed to go off campus.    All prescriptions are going to be routed to [facility]) in the future

## 2018-11-08 NOTE — Telephone Encounter (Signed)
Rec'd call from [employer] re: need to transfer Rx for Lunesta to [facility] in [place], [place].  Pt. Is required to get his medications on the campus of the retirement home.  Attempted to change the routing of Rx for Lunesta to [facility]. Via electronic transmission, but Rx defaulted to print in the office.  Called [facility] office and spoke with [place].  Advised that Rx for Lunesta printed in office.  Requested to have nurse fax the Rx to [facility].    [place] requested to send note to office for nurse to fax Rx.  (Rx was processed on separate encounter, prior to documenting on this TE, and printed in the office)

## 2018-11-08 NOTE — Progress Notes (Signed)
rec'd request to send Rx for Lunesta to [facility]; pt. Resides at [facility] and is required to get medication filled at [facility].  Rx defaulted to print mode, in the office, and message sent to office nurse, requesting to fax to [facility] at [phone].

## 2018-11-09 MED ORDER — ESZOPICLONE 2 MG PO TABS: 2.0000 mg | ORAL_TABLET | Freq: Every evening | ORAL | 1 refills | Status: DC | PRN

## 2018-11-09 NOTE — Addendum Note (Signed)
Addended by: Delice Bison E on: 11/09/2018 10:47 AM   Modules accepted: Orders

## 2018-11-09 NOTE — Telephone Encounter (Signed)
Rx has been faxed over to pharmacy. ?

## 2018-11-13 ENCOUNTER — Other Ambulatory Visit: Payer: Self-pay | Admitting: Internal Medicine

## 2018-11-13 MED ORDER — RAMIPRIL 5 MG PO CAPS: 5.0000 mg | ORAL_CAPSULE | Freq: Every day | ORAL | 0 refills | Status: DC

## 2018-11-13 NOTE — Telephone Encounter (Signed)
Medication Refill - Medication: ramipril (ALTACE) 5 MG capsule + apixaban (ELIQUIS) 5 MG TABS tablet Prescription request from Pharmacy. Pt will be out within a week. Please advise  Baldo Ash, pharmacist from [facility] called requesting Rx be sent   Has the patient contacted their pharmacy? Yes.   (Agent: If no, request that the patient contact the pharmacy for the refill.) (Agent: If yes, when and what did the pharmacy advise?)  Preferred Pharmacy (with phone number or street name):  [facility] - [place], [place] - [address]  [address] [place] [address]  Phone: [phone] Fax: [phone]     Agent: Please be advised that RX refills may take up to 3 business days. We ask that you follow-up with your pharmacy.

## 2018-12-20 ENCOUNTER — Ambulatory Visit: Payer: Federal, State, Local not specified - PPO | Admitting: Orthopaedic Surgery

## 2018-12-21 ENCOUNTER — Encounter: Payer: Self-pay | Admitting: Orthopaedic Surgery

## 2018-12-21 ENCOUNTER — Other Ambulatory Visit: Payer: Self-pay

## 2018-12-21 ENCOUNTER — Ambulatory Visit (INDEPENDENT_AMBULATORY_CARE_PROVIDER_SITE_OTHER): Payer: Federal, State, Local not specified - PPO | Admitting: Orthopaedic Surgery

## 2018-12-21 DIAGNOSIS — S86812D Strain of other muscle(s) and tendon(s) at lower leg level, left leg, subsequent encounter: Secondary | ICD-10-CM

## 2018-12-21 NOTE — Progress Notes (Signed)
Post-Op Visit Note   Patient: Alejandro Brown           Date of Birth: [birth year]           MRN: [id number] Visit Date: 12/21/2018 PCP: Binnie Rail, MD   Assessment & Plan:  Chief Complaint:  Chief Complaint  Patient presents with  . Left Knee - Pain   Visit Diagnoses:  1. Patellar tendon rupture, left, subsequent encounter     Plan: Patient is a pleasant 63 year old gentleman who presents our clinic today for month status post left patellar tendon repair, date of surgery 08/15/2018.  He has been doing fairly well and has progressed nicely with physical therapy.  He notes that physical therapy is no longer working with him.  He is wearing his hinged knee brace locked from 0 to 90 degrees.  Examination of his left knee reveals a fully healed surgical scar without complications.  He is able to fully extend his leg.  He is able to fully straight leg raise.  He has flexion to about 100 degrees.  Calf is soft and nontender.  At this point, I have fully unlocked his hinged knee brace.  He will restart physical therapy to work on strengthening his leg and then can discontinue his hinged knee brace once he has sufficient strength.  He will follow-up with [place] in 2 months for final check.  Call with concerns or questions in the meantime.  Follow-Up Instructions: Return in about 8 weeks (around 02/15/2019).   Orders:  No orders of the defined types were placed in this encounter.  No orders of the defined types were placed in this encounter.   Imaging: No new imaging  PMFS History: Patient Active Problem List   Diagnosis Date Noted  . DVT (deep venous thrombosis) ([place]) 11/07/2018  . Tachycardia 11/01/2018  . Patellar tendon rupture, left, initial encounter 08/15/2018  . Right shoulder pain 07/09/2018  . Umbilical hernia [id number]  . Enlarged prostate 06/12/2015  . Elevated PSA 06/12/2015  . Ventral hernia 06/12/2015  . HTN (hypertension) 08/31/2010  . Diabetes type 2,  controlled ([place]) 08/31/2010  . Obesity 08/31/2010  . Mental retardation 08/31/2010  . Dependent edema 08/31/2010  . Obstructive sleep apnea 05/05/2007  . ALLERGIC RHINITIS 05/04/2007   Past Medical History:  Diagnosis Date  . ALLERGIC RHINITIS   . Allergy   . Arthritis   . Dependent edema    chronic L>R  . Diverticulosis of colon   . GERD (gastroesophageal reflux disease)   . Hypertension   . Mental retardation    mild  . Obesity   . OBSTRUCTIVE SLEEP APNEA    noncompliant with CPAP  . Rupture of left patellar tendon   . Sleep apnea   . Type II or unspecified type diabetes mellitus without mention of complication, not stated as uncontrolled    diet controlled  . Umbilical hernia     Family History  Problem Relation Age of Onset  . Breast cancer Sister   . Breast cancer Other   . Colon cancer Neg Hx   . Esophageal cancer Neg Hx   . Rectal cancer Neg Hx   . Stomach cancer Neg Hx     Past Surgical History:  Procedure Laterality Date  . HERNIA REPAIR    . NASAL SEPTUM SURGERY    . PATELLAR TENDON REPAIR Left 08/15/2018   Procedure: LEFT PATELLAR TENDON REPAIR;  Surgeon: Leandrew Koyanagi, MD;  Location: [facility];  Service: Orthopedics;  Laterality: Left;  . ROTATOR CUFF REPAIR Left 06/2014   [name]   Social History   Occupational History  . 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 Smoking status: Never Smoker  . Smokeless tobacco: Never Used  Substance and Sexual Activity  . Alcohol use: No    Alcohol/week: 0.0 standard drinks  . Drug use: No  . Sexual activity: Not on file

## 2018-12-24 ENCOUNTER — Telehealth: Payer: Self-pay | Admitting: Orthopaedic Surgery

## 2018-12-24 NOTE — Telephone Encounter (Signed)
Alejandro with [facility] called in needing to have a call back from someone in regards to a medication interaction with the recent medication prescribed meloxicam.   [phone]

## 2018-12-24 NOTE — Telephone Encounter (Signed)
Called back no answer. LMOM. Need more details as to which medications are the ones with interaction.

## 2018-12-25 NOTE — Telephone Encounter (Signed)
Called to advise [place].

## 2018-12-25 NOTE — Telephone Encounter (Signed)
Baldo Ash called back and states Patient is  on Eliquis-blood thinner. Would like to know if this is okay for him to take Meloxicam as you prescribed him at the last OV visit. She would like a CB. Closes pharmacy at  1:30pm. See message below.

## 2018-12-25 NOTE — Telephone Encounter (Signed)
No meloxicam or any other nsaids

## 2018-12-25 NOTE — Telephone Encounter (Signed)
Can only take tylenol.

## 2018-12-26 ENCOUNTER — Ambulatory Visit: Payer: Federal, State, Local not specified - PPO | Admitting: Orthopaedic Surgery

## 2019-01-10 ENCOUNTER — Telehealth: Payer: Self-pay | Admitting: Internal Medicine

## 2019-01-10 MED ORDER — RAMIPRIL 5 MG PO CAPS: 5.0000 mg | ORAL_CAPSULE | Freq: Every day | ORAL | 1 refills | Status: DC

## 2019-01-10 NOTE — Telephone Encounter (Signed)
[facility] called and stated that she would like to know if pt needs an OV to get ramipril (ALTACE) 5 MG capsule [id number] refilled. Please advise on what they need to tell the pt to do. Please advise   [facility] [place], [place] - [address] [phone] (Phone) [phone] (Fax)

## 2019-01-10 NOTE — Telephone Encounter (Signed)
Reviewed chart pt is up-to-date sent refills to [facility].../lmb  

## 2019-01-14 ENCOUNTER — Other Ambulatory Visit: Payer: Self-pay | Admitting: Internal Medicine

## 2019-01-14 MED ORDER — ESZOPICLONE 2 MG PO TABS: 2.0000 mg | ORAL_TABLET | Freq: Every evening | ORAL | 1 refills | Status: DC | PRN

## 2019-01-14 NOTE — Telephone Encounter (Signed)
Requested medication (s) are due for refill today: yes  Requested medication (s) are on the active medication list: yes  Last refill:  11/08/2018  Future visit scheduled: no  Notes to clinic:  Refill cannot be delegated    Requested Prescriptions  Pending Prescriptions Disp Refills   eszopiclone (LUNESTA) 2 MG TABS tablet 30 tablet 1    Sig: Take 1 tablet (2 mg total) by mouth at bedtime as needed for sleep. Take immediately before bedtime     Not Delegated - Psychiatry:  Anxiolytics/Hypnotics Failed - 01/14/2019 12:33 PM      Failed - This refill cannot be delegated      Failed - Urine Drug Screen completed in last 360 days.      Passed - Valid encounter within last 6 months    Recent Outpatient Visits          2 months ago Essential hypertension   [place], Claudina Lick, MD   2 months ago [place], MD   6 months ago Right shoulder pain, unspecified chronicity   [place], Claudina Lick, MD   1 year ago Preventative health care   [place], Claudina Lick, MD   1 year ago Essential hypertension   [place], Stacy J, MD      Future Appointments            In 1 month Leandrew Koyanagi, MD [facility]

## 2019-01-14 NOTE — Telephone Encounter (Signed)
eszopiclone (LUNESTA) 2 MG TABS tablet  Send to [facility]/ [facility]

## 2019-01-16 ENCOUNTER — Telehealth: Payer: Self-pay

## 2019-01-16 ENCOUNTER — Encounter: Payer: Self-pay | Admitting: Orthopaedic Surgery

## 2019-01-16 NOTE — Telephone Encounter (Signed)
He has completed over three months so we will stop the medication.  He should continue to wear his compression socks and ambulate regularly.

## 2019-01-16 NOTE — Telephone Encounter (Signed)
Copied from [place] [id number]. Topic: General - Other >> Jan 16, 2019 10:10 AM Jodie Echevaria wrote: Reason for CRM: Anderson Malta nurse at [facility] called to inform the nurse of Dr Quay Burow that patient is on Eliquis 5 MG twice daily is causing diarrhea so asking for that to be changed to a different blood thinner if Dr Quay Burow want him to stay on a blood thinner. Please call Anderson Malta at Ph# [phone]

## 2019-01-16 NOTE — Telephone Encounter (Signed)
Alejandro Brown aware of response. Orders have also been faxed to her at [phone].

## 2019-01-28 ENCOUNTER — Encounter: Payer: Self-pay | Admitting: Internal Medicine

## 2019-01-29 DIAGNOSIS — G479 Sleep disorder, unspecified: Secondary | ICD-10-CM | POA: Insufficient documentation

## 2019-01-29 NOTE — Progress Notes (Signed)
Subjective:    Patient ID: Alejandro Brown, male    DOB: September 12, 1955, 63 y.o.   MRN: [id number]  HPI The patient is here for follow up.    Medications and allergies reviewed with patient and updated if appropriate.  Patient Active Problem List   Diagnosis Date Noted  . DVT (deep venous thrombosis) ([place]) 11/07/2018  . Tachycardia 11/01/2018  . Patellar tendon rupture, left, initial encounter 08/15/2018  . Right shoulder pain 07/09/2018  . Umbilical hernia [id number]  . Enlarged prostate 06/12/2015  . Elevated PSA 06/12/2015  . Ventral hernia 06/12/2015  . HTN (hypertension) 08/31/2010  . Diabetes type 2, controlled ([place]) 08/31/2010  . Obesity 08/31/2010  . Mental retardation 08/31/2010  . Dependent edema 08/31/2010  . Obstructive sleep apnea 05/05/2007  . ALLERGIC RHINITIS 05/04/2007    Current Outpatient Medications on File Prior to Visit  Medication Sig Dispense Refill  . Calcium Polycarbophil (FIBER) 625 MG TABS Taking one daily (Patient taking differently: Take 625 mg by mouth daily. Taking one daily) 14 each 0  . Cholecalciferol (VITAMIN D3) 50 MCG (2000 UT) TABS Take by mouth.    Water engineer Bandages & Supports (MEDICAL COMPRESSION SOCKS) MISC Medium compression 20-30 mm Hg for chronic leg edema 4 each 0  . Ensure (ENSURE) Take 237 mLs by mouth.    . eszopiclone (LUNESTA) 2 MG TABS tablet Take 1 tablet (2 mg total) by mouth at bedtime as needed for sleep. Take immediately before bedtime 30 tablet 1  . furosemide (LASIX) 40 MG tablet Take 1 tablet (40 mg total) by mouth daily. 90 tablet 1  . HYDROcodone-acetaminophen (NORCO) 10-325 MG tablet Take 1 tablet by mouth every 6 (six) hours as needed.    . multivitamin (ONE-A-DAY MEN'S) TABS tablet Take 1 tablet by mouth daily.  0  . polyethylene glycol (MIRALAX / GLYCOLAX) 17 g packet Take 17 g by mouth daily.    . ramipril (ALTACE) 5 MG capsule Take 1 capsule (5 mg total) by mouth daily. 90 capsule 1  . traMADol  (ULTRAM) 50 MG tablet Take 50 mg by mouth every 6 (six) hours as needed.     No current facility-administered medications on file prior to visit.     Past Medical History:  Diagnosis Date  . ALLERGIC RHINITIS   . Allergy   . Arthritis   . Dependent edema    chronic L>R  . Diverticulosis of colon   . GERD (gastroesophageal reflux disease)   . Hypertension   . Mental retardation    mild  . Obesity   . OBSTRUCTIVE SLEEP APNEA    noncompliant with CPAP  . Rupture of left patellar tendon   . Sleep apnea   . Type II or unspecified type diabetes mellitus without mention of complication, not stated as uncontrolled    diet controlled  . Umbilical hernia     Past Surgical History:  Procedure Laterality Date  . HERNIA REPAIR    . NASAL SEPTUM SURGERY    . PATELLAR TENDON REPAIR Left 08/15/2018   Procedure: LEFT PATELLAR TENDON REPAIR;  Surgeon: Leandrew Koyanagi, MD;  Location: [facility];  Service: Orthopedics;  Laterality: Left;  . ROTATOR CUFF REPAIR Left 06/2014   [name]    Social History   Socioeconomic History  . Marital status: Single    Spouse name: n/a  . Number of children: 0  . Years of education: 12th grade  . Highest education level: Not on file  Occupational History  . 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 Financial resource strain: Not on file  . Food insecurity    Worry: Not on file    Inability: Not on file  . Transportation needs    Medical: Not on file    Non-medical: Not on file  Tobacco Use  . Smoking status: Never Smoker  . Smokeless tobacco: Never Used  Substance and Sexual Activity  . Alcohol use: No    Alcohol/week: 0.0 standard drinks  . Drug use: No  . Sexual activity: Not on file  Lifestyle  . Physical activity    Days per week: Not on file    Minutes per session: Not on file  . Stress: Not on file  Relationships  . Social Herbalist on phone: Not on file    Gets together: Not on  file    Attends religious service: Not on file    Active member of club or organization: Not on file    Attends meetings of clubs or organizations: Not on file    Relationship status: Not on file  Other Topics Concern  . 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    Family History  Problem Relation Age of Onset  . Breast cancer Sister   . Breast cancer Other   . Colon cancer Neg Hx   . Esophageal cancer Neg Hx   . Rectal cancer Neg Hx   . Stomach cancer Neg Hx     Review of Systems     Objective:  There were no vitals filed for this visit. BP Readings from Last 3 Encounters:  11/07/18 110/80  08/15/18 (!) 142/82  07/19/18 132/82   Wt Readings from Last 3 Encounters:  11/07/18 245 lb (111.1 kg)  08/15/18 225 lb 1.4 oz (102.1 kg)  01/15/18 244 lb (110.7 kg)   There is no height or weight on file to calculate BMI.   Physical Exam          Assessment & Plan:    See Problem List for Assessment and Plan of chronic medical problems.   This encounter was created in error - please disregard.

## 2019-01-30 ENCOUNTER — Encounter: Payer: Federal, State, Local not specified - PPO | Admitting: Internal Medicine

## 2019-02-04 ENCOUNTER — Telehealth: Payer: Self-pay | Admitting: Internal Medicine

## 2019-02-04 NOTE — Telephone Encounter (Signed)
Anderson Malta, from [facility], called stating the facility has faxed over medication list and 485 form last week and today. Anderson Malta is requesting to have PCP sign and date and send the paperwork back. Please advise.   Callback # [phone]   Fax # [phone]

## 2019-02-04 NOTE — Telephone Encounter (Signed)
Received fax for pt. Will give to Dr. Quay Burow to sign and fax back.

## 2019-02-05 ENCOUNTER — Other Ambulatory Visit: Payer: Self-pay

## 2019-02-05 MED ORDER — ESZOPICLONE 2 MG PO TABS: 2.0000 mg | ORAL_TABLET | Freq: Every evening | ORAL | 1 refills | Status: DC | PRN

## 2019-02-12 NOTE — Progress Notes (Signed)
Subjective:    Patient ID: Alejandro Brown, male    DOB: 08-05-1955, 63 y.o.   MRN: [id number]  HPI The patient is here for an acute visit.  He is exercising regularly - walking.     Left groin pain:  It started about three weeks ago.  He feels it at night, but not every night.  It is pain from the upper thigh to his stomach.  Changing positions will make it go away.  It is more of a discomfort.  He denies lump or bulge.   Denies N/T.  No radiating pain.  It feels like pinch.  He denies feeling it during the day.    Hypertension: He is taking his medication daily. He is compliant with a low sodium diet.  He denies chest pain, palpitations, edema, shortness of breath and regular headaches.  He does not monitor his blood pressure at home.    Diabetes: He is taking his medication daily as prescribed. He is compliant with a diabetic diet.  He checks his feet daily and denies foot lesions. He is up-to-date with an ophthalmology examination.   Insomnia:  He is taking the lunesta nightly.  He sleeps well with the medication and wants to continue it.       Medications and allergies reviewed with patient and updated if appropriate.  Patient Active Problem List   Diagnosis Date Noted  . Left groin pain 02/13/2019  . Sleep difficulties 01/29/2019  . DVT (deep venous thrombosis) ([place]) 11/07/2018  . Tachycardia 11/01/2018  . Patellar tendon rupture, left, initial encounter 08/15/2018  . Right shoulder pain 07/09/2018  . Umbilical hernia [id number]  . Enlarged prostate 06/12/2015  . Elevated PSA 06/12/2015  . Ventral hernia 06/12/2015  . HTN (hypertension) 08/31/2010  . Diabetes type 2, controlled ([place]) 08/31/2010  . Obesity 08/31/2010  . Mental retardation 08/31/2010  . Dependent edema 08/31/2010  . Obstructive sleep apnea 05/05/2007  . ALLERGIC RHINITIS 05/04/2007    Current Outpatient Medications on File Prior to Visit  Medication Sig Dispense Refill  . Calcium  Polycarbophil (FIBER) 625 MG TABS Taking one daily (Patient taking differently: Take 625 mg by mouth daily. Taking one daily) 14 each 0  . Cholecalciferol (VITAMIN D3) 50 MCG (2000 UT) TABS Take by mouth.    Water engineer Bandages & Supports (MEDICAL COMPRESSION SOCKS) MISC Medium compression 20-30 mm Hg for chronic leg edema 4 each 0  . Ensure (ENSURE) Take 237 mLs by mouth.    . eszopiclone (LUNESTA) 2 MG TABS tablet Take 1 tablet (2 mg total) by mouth at bedtime as needed for sleep. Take immediately before bedtime 30 tablet 1  . furosemide (LASIX) 40 MG tablet Take 1 tablet (40 mg total) by mouth daily. 90 tablet 1  . multivitamin (ONE-A-DAY MEN'S) TABS tablet Take 1 tablet by mouth daily.  0  . polyethylene glycol (MIRALAX / GLYCOLAX) 17 g packet Take 17 g by mouth daily.    . ramipril (ALTACE) 5 MG capsule Take 1 capsule (5 mg total) by mouth daily. 90 capsule 1   No current facility-administered medications on file prior to visit.     Past Medical History:  Diagnosis Date  . ALLERGIC RHINITIS   . Allergy   . Arthritis   . Dependent edema    chronic L>R  . Diverticulosis of colon   . GERD (gastroesophageal reflux disease)   . Hypertension   . Mental retardation    mild  . Obesity   .  OBSTRUCTIVE SLEEP APNEA    noncompliant with CPAP  . Rupture of left patellar tendon   . Sleep apnea   . Type II or unspecified type diabetes mellitus without mention of complication, not stated as uncontrolled    diet controlled  . Umbilical hernia     Past Surgical History:  Procedure Laterality Date  . HERNIA REPAIR    . NASAL SEPTUM SURGERY    . PATELLAR TENDON REPAIR Left 08/15/2018   Procedure: LEFT PATELLAR TENDON REPAIR;  Surgeon: Leandrew Koyanagi, MD;  Location: [facility];  Service: Orthopedics;  Laterality: Left;  . ROTATOR CUFF REPAIR Left 06/2014   [name]    Social History   Socioeconomic History  . Marital status: Single    Spouse name: n/a  . Number of  children: 0  . Years of education: 12th grade  . Highest education level: Not on file  Occupational History  . 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 Financial resource strain: Not on file  . Food insecurity    Worry: Not on file    Inability: Not on file  . Transportation needs    Medical: Not on file    Non-medical: Not on file  Tobacco Use  . Smoking status: Never Smoker  . Smokeless tobacco: Never Used  Substance and Sexual Activity  . Alcohol use: No    Alcohol/week: 0.0 standard drinks  . Drug use: No  . Sexual activity: Not on file  Lifestyle  . Physical activity    Days per week: Not on file    Minutes per session: Not on file  . Stress: Not on file  Relationships  . Social Herbalist on phone: Not on file    Gets together: Not on file    Attends religious service: Not on file    Active member of club or organization: Not on file    Attends meetings of clubs or organizations: Not on file    Relationship status: Not on file  Other Topics Concern  . 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    Family History  Problem Relation Age of Onset  . Breast cancer Sister   . Breast cancer Other   . Colon cancer Neg Hx   . Esophageal cancer Neg Hx   . Rectal cancer Neg Hx   . Stomach cancer Neg Hx     Review of Systems  Constitutional: Negative for chills and fever.  Respiratory: Negative for cough, shortness of breath and wheezing.   Cardiovascular: Negative for chest pain, palpitations and leg swelling.  Neurological: Negative for light-headedness and headaches.       Objective:   Vitals:   02/13/19 0947  BP: 130/82  Pulse: 89  Resp: 16  Temp: 98.4 F (36.9 C)  SpO2: 97%   BP Readings from Last 3 Encounters:  02/13/19 130/82  11/07/18 110/80  08/15/18 (!) 142/82   Wt Readings from Last 3 Encounters:  02/13/19 247 lb (112  kg)  11/07/18 245 lb (111.1 kg)  08/15/18 225 lb 1.4 oz (102.1 kg)   Body mass index is 41.1 kg/m.   Physical Exam         Assessment & Plan:    See Problem List for Assessment and Plan of chronic medical problems.

## 2019-02-13 ENCOUNTER — Encounter: Payer: Self-pay | Admitting: Internal Medicine

## 2019-02-13 ENCOUNTER — Other Ambulatory Visit: Payer: Self-pay

## 2019-02-13 ENCOUNTER — Ambulatory Visit (INDEPENDENT_AMBULATORY_CARE_PROVIDER_SITE_OTHER)
Admission: RE | Admit: 2019-02-13 | Discharge: 2019-02-13 | Disposition: A | Discharge status: Home / Self Care | Payer: Medicare Other | Source: Ambulatory Visit | Attending: Internal Medicine | Admitting: Internal Medicine

## 2019-02-13 ENCOUNTER — Ambulatory Visit (INDEPENDENT_AMBULATORY_CARE_PROVIDER_SITE_OTHER): Payer: Medicare Other | Admitting: Internal Medicine

## 2019-02-13 VITALS — BP 130/82 | HR 89 | Temp 98.4°F | Resp 16 | Ht 65.0 in | Wt 247.0 lb

## 2019-02-13 DIAGNOSIS — Z23 Encounter for immunization: Secondary | ICD-10-CM

## 2019-02-13 DIAGNOSIS — G479 Sleep disorder, unspecified: Secondary | ICD-10-CM | POA: Diagnosis not present

## 2019-02-13 DIAGNOSIS — R1032 Left lower quadrant pain: Secondary | ICD-10-CM

## 2019-02-13 DIAGNOSIS — E119 Type 2 diabetes mellitus without complications: Secondary | ICD-10-CM

## 2019-02-13 DIAGNOSIS — R609 Edema, unspecified: Secondary | ICD-10-CM

## 2019-02-13 DIAGNOSIS — I10 Essential (primary) hypertension: Secondary | ICD-10-CM | POA: Diagnosis not present

## 2019-02-13 NOTE — Assessment & Plan Note (Signed)
Blood pressure well controlled Continue current medications at current doses 

## 2019-02-13 NOTE — Assessment & Plan Note (Signed)
Diet controlled Last A1c was well controlled Will recheck at next visit Encouraged continuing regular exercise and to work on weight loss

## 2019-02-13 NOTE — Assessment & Plan Note (Signed)
Currently he is not in pain and there is no pain with palpation History fits more with a musculoskeletal cause, possible lumbar radiculopathy Pain is not severe-more of a discomfort and he has only had a few episodes We will get an x-ray today Monitor symptoms and if persist may need further evaluation

## 2019-02-13 NOTE — Assessment & Plan Note (Signed)
Swelling controlled Continue Lasix 40 mg daily Continue compression sock on left lower extremity Continue elevation when sitting Continue regular exercise Encourage weight loss

## 2019-02-13 NOTE — Patient Instructions (Addendum)
Have an xray today.      Flu immunization administered today.     Medications reviewed and updated.  Changes include :   none    Please followup in 6 months

## 2019-02-13 NOTE — Assessment & Plan Note (Signed)
Sleeping well with Lunesta 2 mg nightly We will continue

## 2019-02-15 ENCOUNTER — Encounter: Payer: Self-pay | Admitting: Internal Medicine

## 2019-03-01 ENCOUNTER — Other Ambulatory Visit: Payer: Self-pay

## 2019-03-01 ENCOUNTER — Encounter: Payer: Self-pay | Admitting: Orthopaedic Surgery

## 2019-03-01 ENCOUNTER — Ambulatory Visit (INDEPENDENT_AMBULATORY_CARE_PROVIDER_SITE_OTHER): Payer: Medicare Other | Admitting: Orthopaedic Surgery

## 2019-03-01 DIAGNOSIS — S86812D Strain of other muscle(s) and tendon(s) at lower leg level, left leg, subsequent encounter: Secondary | ICD-10-CM | POA: Diagnosis not present

## 2019-03-01 NOTE — Progress Notes (Signed)
Post-Op Visit Note   Patient: Alejandro Brown           Date of Birth: 1955/12/03           MRN: [id number] Visit Date: 03/01/2019 PCP: Binnie Rail, MD   Assessment & Plan:  Chief Complaint:  Chief Complaint  Patient presents with  . Left Knee - Follow-up   Visit Diagnoses:  1. Patellar tendon rupture, left, subsequent encounter     Plan: Patient is a pleasant 63 year old gentleman who presents our clinic today about 6-1/2 months out left patellar tendon repair, date of surgery 01/15/2019.  He has been doing well.  He has been slow to progress with physical therapy but has recently been discharged.  He continues to work on a home exercise program.  He still notes some weakness but this has improved with time.  Examination of his left knee reveals a fully healed surgical scar without complication.  Range of motion 10 degrees extension lag to 120 degrees of flexion.  3 out of 5 strength with straight leg raise.  He is neurovascular intact distally.  At this point, he will continue to work on his range of motion strengthening exercises at home.  He will follow-up with [place] as needed.  Follow-Up Instructions: Return if symptoms worsen or fail to improve.   Orders:  No orders of the defined types were placed in this encounter.  No orders of the defined types were placed in this encounter.   Imaging: No new imaging  PMFS History: Patient Active Problem List   Diagnosis Date Noted  . Left groin pain 02/13/2019  . Sleep difficulties 01/29/2019  . DVT (deep venous thrombosis) ([place]) 11/07/2018  . Tachycardia 11/01/2018  . Patellar tendon rupture, left, initial encounter 08/15/2018  . Right shoulder pain 07/09/2018  . Umbilical hernia [id number]  . Enlarged prostate 06/12/2015  . Elevated PSA 06/12/2015  . Ventral hernia 06/12/2015  . HTN (hypertension) 08/31/2010  . Diabetes type 2, controlled ([place]) 08/31/2010  . Obesity 08/31/2010  . Mental retardation 08/31/2010  .  Dependent edema 08/31/2010  . Obstructive sleep apnea 05/05/2007  . ALLERGIC RHINITIS 05/04/2007   Past Medical History:  Diagnosis Date  . ALLERGIC RHINITIS   . Allergy   . Arthritis   . Dependent edema    chronic L>R  . Diverticulosis of colon   . GERD (gastroesophageal reflux disease)   . Hypertension   . Mental retardation    mild  . Obesity   . OBSTRUCTIVE SLEEP APNEA    noncompliant with CPAP  . Rupture of left patellar tendon   . Sleep apnea   . Type II or unspecified type diabetes mellitus without mention of complication, not stated as uncontrolled    diet controlled  . Umbilical hernia     Family History  Problem Relation Age of Onset  . Breast cancer Sister   . Breast cancer Other   . Colon cancer Neg Hx   . Esophageal cancer Neg Hx   . Rectal cancer Neg Hx   . Stomach cancer Neg Hx     Past Surgical History:  Procedure Laterality Date  . HERNIA REPAIR    . NASAL SEPTUM SURGERY    . PATELLAR TENDON REPAIR Left 08/15/2018   Procedure: LEFT PATELLAR TENDON REPAIR;  Surgeon: Leandrew Koyanagi, MD;  Location: [facility];  Service: Orthopedics;  Laterality: Left;  . ROTATOR CUFF REPAIR Left 06/2014   [name]   Social History  Occupational History  . 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 Smoking status: Never Smoker  . Smokeless tobacco: Never Used  Substance and Sexual Activity  . Alcohol use: No    Alcohol/week: 0.0 standard drinks  . Drug use: No  . Sexual activity: Not on file

## 2019-04-09 ENCOUNTER — Other Ambulatory Visit: Payer: Self-pay | Admitting: Internal Medicine

## 2019-04-09 NOTE — Telephone Encounter (Signed)
Medication Refill - Medication: Generic Lunesta 2 mg  [facility]   CB#  [phone]

## 2019-04-11 MED ORDER — ESZOPICLONE 2 MG PO TABS: 2.0000 mg | ORAL_TABLET | Freq: Every evening | ORAL | 1 refills | Status: DC | PRN

## 2019-04-11 NOTE — Telephone Encounter (Signed)
Pharmacy has called back in regard.  This message was not routed to [place] on the 19th.  Pharmacy would like to know if this can be processed for the patient today due to him being out of the medication.

## 2019-04-11 NOTE — Telephone Encounter (Signed)
Last OV 11/07/18 Next OV 08/13/19  I could not find in the database when it was last filled.

## 2019-05-07 ENCOUNTER — Telehealth: Payer: Self-pay

## 2019-05-07 MED ORDER — FUROSEMIDE 40 MG PO TABS: 40.0000 mg | ORAL_TABLET | Freq: Every day | ORAL | 1 refills | Status: DC

## 2019-05-07 NOTE — Telephone Encounter (Signed)
New message     1. Which medications need to be refilled? (please list name of each medication and dose if known) furosemide (LASIX) 40 MG tablet  2. Which pharmacy/location (including street and city if local pharmacy) is medication to be sent to? [facility]  - [place]    3. Do they need a 30 day or 90 day supply? 90 day supply

## 2019-05-20 ENCOUNTER — Other Ambulatory Visit: Payer: Self-pay

## 2019-05-20 ENCOUNTER — Encounter: Payer: Self-pay | Admitting: Internal Medicine

## 2019-05-20 ENCOUNTER — Ambulatory Visit (HOSPITAL_COMMUNITY)
Admission: RE | Admit: 2019-05-20 | Discharge: 2019-05-20 | Disposition: A | Discharge status: Home / Self Care | Payer: Medicare Other | Source: Ambulatory Visit | Attending: Surgery | Admitting: Surgery

## 2019-05-20 ENCOUNTER — Telehealth: Payer: Self-pay

## 2019-05-20 ENCOUNTER — Ambulatory Visit (INDEPENDENT_AMBULATORY_CARE_PROVIDER_SITE_OTHER): Payer: Medicare Other | Admitting: Internal Medicine

## 2019-05-20 VITALS — BP 124/72 | HR 86 | Temp 98.4°F | Ht 65.0 in | Wt 205.6 lb

## 2019-05-20 DIAGNOSIS — M7989 Other specified soft tissue disorders: Secondary | ICD-10-CM

## 2019-05-20 DIAGNOSIS — R0602 Shortness of breath: Secondary | ICD-10-CM | POA: Diagnosis not present

## 2019-05-20 LAB — COMPREHENSIVE METABOLIC PANEL
ALT: 10 U/L (ref 0–53)
AST: 11 U/L (ref 0–37)
Albumin: 3.9 g/dL (ref 3.5–5.2)
Alkaline Phosphatase: 53 U/L (ref 39–117)
BUN: 15 mg/dL (ref 6–23)
CO2: 32 mEq/L (ref 19–32)
Calcium: 9.6 mg/dL (ref 8.4–10.5)
Chloride: 102 mEq/L (ref 96–112)
Creatinine, Ser: 1.16 mg/dL (ref 0.40–1.50)
GFR: 63.13 mL/min (ref >60.00)
Glucose, Bld: 108 mg/dL — ABNORMAL HIGH (ref 70–99)
Potassium: 4.5 mEq/L (ref 3.5–5.1)
Sodium: 140 mEq/L (ref 135–145)
Total Bilirubin: 0.8 mg/dL (ref 0.2–1.2)
Total Protein: 6.8 g/dL (ref 6.0–8.3)

## 2019-05-20 LAB — CBC
HCT: 43.3 % (ref 39.0–52.0)
Hemoglobin: 14.5 g/dL (ref 13.0–17.0)
MCHC: 33.4 g/dL (ref 30.0–36.0)
MCV: 90.6 fl (ref 78.0–100.0)
Platelets: 252 10*3/uL (ref 150.0–400.0)
RBC: 4.78 Mil/uL (ref 4.22–5.81)
RDW: 12.8 % (ref 11.5–15.5)
WBC: 8.8 10*3/uL (ref 4.0–10.5)

## 2019-05-20 LAB — BRAIN NATRIURETIC PEPTIDE: Pro B Natriuretic peptide (BNP): 42 pg/mL (ref 0.0–100.0)

## 2019-05-20 MED ORDER — RIVAROXABAN (XARELTO) VTE STARTER PACK (15 & 20 MG): ORAL_TABLET | ORAL | 0 refills | Status: DC

## 2019-05-20 NOTE — Telephone Encounter (Signed)
-----   Message from Hoyt Koch, MD sent at 05/20/2019  3:59 PM EST ----- His US of the leg came back with a blood clot in the leg. This did not look new but they could not tell. We sent in [place] which is a blood thinner for him to take 15 mg pill twice a day for the first 3 weeks then switch to 20 mg pill once a day. Call and let him know. He should schedule follow up with [facility] in 2-3 weeks.

## 2019-05-20 NOTE — Telephone Encounter (Signed)
Pt's sister has been informed or results and expressed understanding.   She was going to verify pharmacy in case we needed to send it somewhere else and call the office back.

## 2019-05-20 NOTE — Progress Notes (Signed)
  Cardiovascular Imaging at [facility]  (Vascular Lab)      Lower extremity venous duplex has been completed and is positive for DVT.  The referring office has instructed [place] to send the patient home. The referring office is going to contact the patient with further instructions.  Findings were reported to Dr. Sharlet Salina at 3:56 pm.  Please see the preliminary report in the CV proc tab in Chart Review.      Bowman, RVT

## 2019-05-20 NOTE — Progress Notes (Signed)
   Subjective:   Patient ID: Alejandro Brown, male    DOB: 1955/06/11, 64 y.o.   MRN: [id number]  HPI The patient is a 64 YO man coming in for concerns about left leg swelling. Prior DVT summer 2020 after right knee surgery in the right leg. Took eliquis for >3 months then stopped. Denies long travel or recent surgery. Denies recent immobilization. Is trying to cut back on salt recently. The swelling started 1-2 weeks ago in the left leg only. Worse with activity. Is not sure if it hurts when just sitting but does hurt some with walking. Denies redness or color change in the skin. Most recent PSA .95 with urology 01/2018. Colonoscopy up to date. Aide present to help give history.   Review of Systems  Constitutional: Negative.   HENT: Negative.   Eyes: Negative.   Respiratory: Negative for cough, chest tightness and shortness of breath.   Cardiovascular: Positive for leg swelling. Negative for chest pain and palpitations.  Gastrointestinal: Negative for abdominal distention, abdominal pain, constipation, diarrhea, nausea and vomiting.  Musculoskeletal: Negative.   Skin: Negative.   Neurological: Negative.   Psychiatric/Behavioral: Negative.     Objective:  Physical Exam Constitutional:      Appearance: He is well-developed. He is obese.  HENT:     Head: Normocephalic and atraumatic.  Cardiovascular:     Rate and Rhythm: Normal rate and regular rhythm.  Pulmonary:     Effort: Pulmonary effort is normal. No respiratory distress.     Breath sounds: Normal breath sounds. No wheezing or rales.  Abdominal:     General: Bowel sounds are normal. There is no distension.     Palpations: Abdomen is soft.     Tenderness: There is no abdominal tenderness. There is no rebound.  Musculoskeletal:     Cervical back: Normal range of motion.     Left lower leg: Edema present.     Comments: Left leg below the knee 2-3+ pitting edema, no calf tenderness. Right leg without swelling.   Skin:  General: Skin is warm and dry.  Neurological:     Mental Status: He is alert and oriented to person, place, and time.     Coordination: Coordination normal.     Vitals:   05/20/19 1422  BP: 124/72  Pulse: 86  Temp: 98.4 F (36.9 C)  TempSrc: Oral  SpO2: 97%  Weight: 205 lb 9.6 oz (93.3 kg)  Height: 5\' 5"  (1.651 m)    This visit occurred during the SARS-CoV-2 public health emergency.  Safety protocols were in place, including screening questions prior to the visit, additional usage of staff PPE, and extensive cleaning of exam room while observing appropriate contact time as indicated for disinfecting solutions.   Assessment & Plan:  Visit time 20 minutes in face to face communication with patient and coordination of care, additional 10 minutes spent in record review, coordination or care, ordering tests, communicating/referring to other healthcare professionals, documenting in medical records all on the same day of the visit for total time 30 minutes spent on the visit.

## 2019-05-20 NOTE — Assessment & Plan Note (Addendum)
Ordered stat doppler to rule out DVT today. Checking CBC, CMP, BNP to rule out alternate etiology. He is up to date on cancer screening. No recent provocation to promote DVT but prior within the last year.  ADDENDUM called by [place] that there is an age indeterminate blood clot in the left popliteal vein. Rx sent in for xarelto starter kit to take as he had diarrhea with eliquis in the past.

## 2019-05-20 NOTE — Addendum Note (Signed)
Addended by: Pricilla Holm A on: 05/20/2019 04:00 PM   Modules accepted: Orders

## 2019-05-22 ENCOUNTER — Telehealth: Payer: Self-pay | Admitting: Internal Medicine

## 2019-05-22 NOTE — Telephone Encounter (Signed)
New message:    Anderson Malta from [facility] is calling and states the patient did not get his discharge papers on 05/20/19 when he was here. She would like to clarify how much walking this patient is supposed to be doing since he has a blood clot in his legs. Please advise.

## 2019-05-22 NOTE — Telephone Encounter (Signed)
He should be walking as much as possible.  There are no limitations.

## 2019-05-23 NOTE — Telephone Encounter (Signed)
Alejandro Brown the message and she expressed understanding.

## 2019-05-27 ENCOUNTER — Telehealth: Payer: Self-pay | Admitting: Internal Medicine

## 2019-05-27 NOTE — Telephone Encounter (Signed)
New Message:    Alejandro Brown  Is calling with [facility] and states the patient is complaining of no appetite due to not be able to taste or smell anything. She states he has been like this for a month and would like an order for him to get tested on tomorrow 05/28/19. She also states the patient was taken off of his fiberlax due to infrequent bowel movements but would like a prescription for him to start back taking this medication. Alejandro Brown states she will be faxing over this request as well. Please advise.

## 2019-05-27 NOTE — Telephone Encounter (Signed)
Okay for Fiber-Lax order.  Do they need an official order for Covid testing?  Do they do a nasal swab there?

## 2019-05-27 NOTE — Telephone Encounter (Signed)
[facility] for fiberlax order

## 2019-05-28 NOTE — Telephone Encounter (Signed)
Yes. Faxed over orders to do COVID testing and ok to take fiberlax.

## 2019-06-05 ENCOUNTER — Telehealth: Payer: Self-pay | Admitting: Internal Medicine

## 2019-06-05 NOTE — Telephone Encounter (Signed)
There is no obvious cause of loss of appetite such as a new medication that he should have a visit.  Should be in the office.

## 2019-06-05 NOTE — Telephone Encounter (Signed)
Appointment scheduled.

## 2019-06-05 NOTE — Telephone Encounter (Signed)
Do you want and OV?

## 2019-06-05 NOTE — Telephone Encounter (Signed)
    Coral Ceo from [facility] [phone] calling to report patient has lost 10lbs within 1 month, stating patient complains of no appetite (covid neg). No other symptoms

## 2019-06-07 ENCOUNTER — Ambulatory Visit: Payer: Federal, State, Local not specified - PPO | Admitting: Internal Medicine

## 2019-06-09 NOTE — Progress Notes (Signed)
Subjective:    Patient ID: Alejandro Brown, male    DOB: February 10, 1956, 64 y.o.   MRN: [id number]  HPI The patient is here for an acute visit for weight loss.  He is here with his sister in law.  He is a poor historian.     He is taking all of his medications as prescribed.    According to our scales he weighed 247 on 02/13/19, he weighed 2/5 on 05/20/19 and now weighs 205.  Marland Kitchen  At [facility] according to their scales he has lost 15 pounds in almost two months.  He states decreased taste for the past  2 1/2 weeks.   Last year he enjoyed the food where he lives, but does not enjoy it now.  He thinks his taste is improving recently.    He has been walking more - he now walks 4 times a day.  Prior to the past month or so he was no active.    He denies other concerns. He is following with psychiatry.  He denies depression.     Medications and allergies reviewed with patient and updated if appropriate.  Patient Active Problem List   Diagnosis Date Noted  . Left leg swelling 05/20/2019  . Left groin pain 02/13/2019  . Sleep difficulties 01/29/2019  . DVT (deep venous thrombosis) ([place]) 11/07/2018  . Tachycardia 11/01/2018  . Patellar tendon rupture, left, initial encounter 08/15/2018  . Right shoulder pain 07/09/2018  . Umbilical hernia [id number]  . Enlarged prostate 06/12/2015  . Elevated PSA 06/12/2015  . Ventral hernia 06/12/2015  . HTN (hypertension) 08/31/2010  . Diabetes type 2, controlled ([place]) 08/31/2010  . Obesity 08/31/2010  . Mental retardation 08/31/2010  . Dependent edema 08/31/2010  . Obstructive sleep apnea 05/05/2007  . ALLERGIC RHINITIS 05/04/2007    Current Outpatient Medications on File Prior to Visit  Medication Sig Dispense Refill  . Calcium Polycarbophil (FIBER) 625 MG TABS Taking one daily (Patient taking differently: Take 625 mg by mouth daily. Taking one daily) 14 each 0  . Cholecalciferol (VITAMIN D3) 50 MCG (2000 UT) TABS Take by mouth.    Hospital doctor Bandages & Supports (MEDICAL COMPRESSION SOCKS) MISC Medium compression 20-30 mm Hg for chronic leg edema 4 each 0  . Ensure (ENSURE) Take 237 mLs by mouth.    . eszopiclone (LUNESTA) 2 MG TABS tablet Take 1 tablet (2 mg total) by mouth at bedtime as needed for sleep. Take immediately before bedtime 30 tablet 1  . furosemide (LASIX) 40 MG tablet Take 1 tablet (40 mg total) by mouth daily. 90 tablet 1  . multivitamin (ONE-A-DAY MEN'S) TABS tablet Take 1 tablet by mouth daily.  0  . polyethylene glycol (MIRALAX / GLYCOLAX) 17 g packet Take 17 g by mouth daily.    . ramipril (ALTACE) 5 MG capsule Take 1 capsule (5 mg total) by mouth daily. 90 capsule 1  . risperiDONE (RISPERDAL) 0.5 MG tablet Take 0.5 mg by mouth at bedtime.    . Rivaroxaban 15 & 20 MG TBPK Follow package directions: Take one 15mg  tablet by mouth twice a day. On day 22, switch to one 20mg  tablet once a day. Take with food. 51 each 0   No current facility-administered medications on file prior to visit.    Past Medical History:  Diagnosis Date  . ALLERGIC RHINITIS   . Allergy   . Arthritis   . Dependent edema    chronic L>R  .  Diverticulosis of colon   . GERD (gastroesophageal reflux disease)   . Hypertension   . Mental retardation    mild  . Obesity   . OBSTRUCTIVE SLEEP APNEA    noncompliant with CPAP  . Rupture of left patellar tendon   . Sleep apnea   . Type II or unspecified type diabetes mellitus without mention of complication, not stated as uncontrolled    diet controlled  . Umbilical hernia     Past Surgical History:  Procedure Laterality Date  . HERNIA REPAIR    . NASAL SEPTUM SURGERY    . PATELLAR TENDON REPAIR Left 08/15/2018   Procedure: LEFT PATELLAR TENDON REPAIR;  Surgeon: Leandrew Koyanagi, MD;  Location: [facility];  Service: Orthopedics;  Laterality: Left;  . ROTATOR CUFF REPAIR Left 06/2014   [name]    Social History   Socioeconomic History  . Marital status: Single     Spouse name: n/a  . Number of children: 0  . Years of education: 12th grade  . Highest education level: Not on file  Occupational History  . 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 Smoking status: Never Smoker  . Smokeless tobacco: Never Used  Substance and Sexual Activity  . Alcohol use: No    Alcohol/week: 0.0 standard drinks  . Drug use: No  . Sexual activity: Not on file  Other Topics Concern  . 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   Social Determinants of Health   Financial Resource Strain:   . Difficulty of Paying Living Expenses:   Food Insecurity:   . Worried About Charity fundraiser in the Last Year:   . Arboriculturist in the Last Year:   Transportation Needs:   . Film/video editor (Medical):   Marland Kitchen Lack of Transportation (Non-Medical):   Physical Activity:   . Days of Exercise per Week:   . Minutes of Exercise per Session:   Stress:   . Feeling of Stress :   Social Connections:   . Frequency of Communication with Friends and Family:   . Frequency of Social Gatherings with Friends and Family:   . Attends Religious Services:   . Active Member of Clubs or Organizations:   . Attends Archivist Meetings:   Marland Kitchen Marital Status:     Family History  Problem Relation Age of Onset  . Breast cancer Sister   . Breast cancer Other   . Colon cancer Neg Hx   . Esophageal cancer Neg Hx   . Rectal cancer Neg Hx   . Stomach cancer Neg Hx     Review of Systems  Constitutional: Negative for chills, diaphoresis, fatigue and fever.  HENT:       Decreased taste  Respiratory: Negative for cough, shortness of breath and wheezing.   Cardiovascular: Negative for palpitations and leg swelling.  Gastrointestinal: Negative for abdominal pain, blood in stool (no black stool), constipation, diarrhea and nausea.       No gerd  Genitourinary: Negative  for dysuria and hematuria.  Neurological: Negative for light-headedness and headaches.  Psychiatric/Behavioral: Positive for dysphoric mood (once in a while). The patient is nervous/anxious (sometimes).        Objective:   Vitals:   06/10/19 1414  BP: 100/62  Pulse: 79  Resp: 16  Temp: 98.7 F (37.1 C)  SpO2: 96%   BP Readings from Last 3 Encounters:  06/10/19 100/62  05/20/19 124/72  02/13/19 130/82   Wt Readings from Last 3 Encounters:  06/10/19 205 lb 12.8 oz (93.4 kg)  05/20/19 205 lb 9.6 oz (93.3 kg)  02/13/19 247 lb (112 kg)   Body mass index is 34.25 kg/m.   Physical Exam    Constitutional: Appears well-developed and well-nourished. No distress.  Head: Normocephalic and atraumatic.  Neck: Neck supple. No tracheal deviation present. No thyromegaly present.  No cervical lymphadenopathy Cardiovascular: Normal rate, regular rhythm and normal heart sounds.  No murmur heard. No carotid bruit .  No edema Pulmonary/Chest: Effort normal and breath sounds normal. No respiratory distress. No has no wheezes. No rales. Abd:  Soft, NT, ND  Skin: Skin is warm and dry. Not diaphoretic.  Psychiatric: Normal mood and affect. Behavior is normal.       Assessment & Plan:    See Problem List for Assessment and Plan of chronic medical problems.    This visit occurred during the SARS-CoV-2 public health emergency.  Safety protocols were in place, including screening questions prior to the visit, additional usage of staff PPE, and extensive cleaning of exam room while observing appropriate contact time as indicated for disinfecting solutions.

## 2019-06-10 ENCOUNTER — Encounter: Payer: Self-pay | Admitting: Internal Medicine

## 2019-06-10 ENCOUNTER — Ambulatory Visit (INDEPENDENT_AMBULATORY_CARE_PROVIDER_SITE_OTHER): Payer: Medicare Other | Admitting: Internal Medicine

## 2019-06-10 ENCOUNTER — Other Ambulatory Visit: Payer: Self-pay

## 2019-06-10 VITALS — BP 100/62 | HR 79 | Temp 98.7°F | Resp 16 | Ht 65.0 in | Wt 205.8 lb

## 2019-06-10 DIAGNOSIS — E119 Type 2 diabetes mellitus without complications: Secondary | ICD-10-CM | POA: Diagnosis not present

## 2019-06-10 DIAGNOSIS — R634 Abnormal weight loss: Secondary | ICD-10-CM | POA: Diagnosis not present

## 2019-06-10 DIAGNOSIS — I10 Essential (primary) hypertension: Secondary | ICD-10-CM | POA: Diagnosis not present

## 2019-06-10 DIAGNOSIS — R600 Localized edema: Secondary | ICD-10-CM | POA: Insufficient documentation

## 2019-06-10 LAB — COMPREHENSIVE METABOLIC PANEL
ALT: 12 U/L (ref 0–53)
AST: 12 U/L (ref 0–37)
Albumin: 3.7 g/dL (ref 3.5–5.2)
Alkaline Phosphatase: 59 U/L (ref 39–117)
BUN: 13 mg/dL (ref 6–23)
CO2: 31 mEq/L (ref 19–32)
Calcium: 9.1 mg/dL (ref 8.4–10.5)
Chloride: 100 mEq/L (ref 96–112)
Creatinine, Ser: 1.07 mg/dL (ref 0.40–1.50)
GFR: 69.28 mL/min (ref >60.00)
Glucose, Bld: 99 mg/dL (ref 70–99)
Potassium: 4.2 mEq/L (ref 3.5–5.1)
Sodium: 138 mEq/L (ref 135–145)
Total Bilirubin: 0.6 mg/dL (ref 0.2–1.2)
Total Protein: 6.6 g/dL (ref 6.0–8.3)

## 2019-06-10 LAB — CBC WITH DIFFERENTIAL/PLATELET
Basophils Absolute: 0.1 10*3/uL (ref 0.0–0.1)
Basophils Relative: 0.7 % (ref 0.0–3.0)
Eosinophils Absolute: 0.2 10*3/uL (ref 0.0–0.7)
Eosinophils Relative: 2.1 % (ref 0.0–5.0)
HCT: 43.8 % (ref 39.0–52.0)
Hemoglobin: 14.9 g/dL (ref 13.0–17.0)
Lymphocytes Relative: 20 % (ref 12.0–46.0)
Lymphs Abs: 1.9 10*3/uL (ref 0.7–4.0)
MCHC: 33.9 g/dL (ref 30.0–36.0)
MCV: 90.4 fl (ref 78.0–100.0)
Monocytes Absolute: 0.6 10*3/uL (ref 0.1–1.0)
Monocytes Relative: 6.3 % (ref 3.0–12.0)
Neutro Abs: 6.9 10*3/uL (ref 1.4–7.7)
Neutrophils Relative %: 70.9 % (ref 43.0–77.0)
Platelets: 292 10*3/uL (ref 150.0–400.0)
RBC: 4.85 Mil/uL (ref 4.22–5.81)
RDW: 12.8 % (ref 11.5–15.5)
WBC: 9.8 10*3/uL (ref 4.0–10.5)

## 2019-06-10 LAB — TSH: TSH: 1.57 u[IU]/mL (ref 0.35–4.50)

## 2019-06-10 LAB — HEMOGLOBIN A1C: Hgb A1c MFr Bld: 5.5 % (ref 4.6–6.5)

## 2019-06-10 NOTE — Assessment & Plan Note (Signed)
Acute Has ? Amount of weight loss - 15 lbs per [facility] in past 2 months No weight loss according to our scale here in the past 20 days He has no concerning symptoms and HM is up to date - follows with urology and colonoscopy up to date He is eating less due to change in taste and has been walking more - this may be the cause of his weight loss ? Cause of decreased taste --- he thinks this is getting better Cbc, cmp, tsh, a1c Will monitor for now - continue to monitor weight If taste does not improve may need to see ENT If weight loss persists will need imaging, but again there are no concerning symptoms Advised to discuss weight loss with psych to make sure there is no depression

## 2019-06-10 NOTE — Patient Instructions (Signed)
  Blood work was ordered.     Medications reviewed and updated.  Changes include :   none    Please put compression socks on both legs daily   If the weight loss continues or the loss of taste continues please call and let me know.

## 2019-06-10 NOTE — Assessment & Plan Note (Signed)
Diet controlled Unlikely cause of weight loss Check a1c

## 2019-06-10 NOTE — Assessment & Plan Note (Signed)
BP Readings from Last 3 Encounters:  06/10/19 100/62  05/20/19 124/72  02/13/19 130/82   Chronic BP well controlled Current regimen effective and well tolerated Continue current medications at current doses

## 2019-06-10 NOTE — Assessment & Plan Note (Addendum)
LLE > RLE but has small varicose veins in RLE  Advised compression socks to both legs daily Continue lasix daily

## 2019-06-13 ENCOUNTER — Other Ambulatory Visit: Payer: Self-pay | Admitting: Internal Medicine

## 2019-06-13 MED ORDER — RIVAROXABAN 20 MG PO TABS: 20.0000 mg | ORAL_TABLET | Freq: Every day | ORAL | 5 refills | Status: DC

## 2019-06-13 NOTE — Telephone Encounter (Signed)
What strength should he be on now?

## 2019-06-13 NOTE — Telephone Encounter (Signed)
xarelto 20 mg daily - pending

## 2019-06-13 NOTE — Telephone Encounter (Signed)
New message:   1.Medication Requested: Rivaroxaban 15 & 20 MG TBPK 2. Pharmacy (Name, [place]): [facility] [place], [place] - [address] 3. On Med List: Yes  4. Last Visit with PCP:   5. Next visit date with PCP:  Baldo Ash is calling and states the patient will be running out very soon of this medication. Please advise. Agent: Please be advised that RX refills may take up to 3 business days. We ask that you follow-up with your pharmacy.

## 2019-06-19 ENCOUNTER — Other Ambulatory Visit: Payer: Self-pay

## 2019-06-19 MED ORDER — FIBER 625 MG PO TABS: 625.0000 mg | ORAL_TABLET | Freq: Every day | ORAL | 1 refills | Status: DC

## 2019-06-25 ENCOUNTER — Telehealth: Payer: Self-pay | Admitting: Internal Medicine

## 2019-06-25 NOTE — Telephone Encounter (Signed)
   1.Medication Requested: eszopiclone (LUNESTA) 2 MG TABS tablet   2. Pharmacy (Name, Street, [place]):[facility] [place], [place] - [address]  3. On Med List: yes  4. Last Visit with PCP: 06/10/19  5. Next visit date with PCP:08/13/19  Agent: Please be advised that RX refills may take up to 3 business days. We ask that you follow-up with your pharmacy.

## 2019-06-26 MED ORDER — ESZOPICLONE 2 MG PO TABS: 2.0000 mg | ORAL_TABLET | Freq: Every evening | ORAL | 1 refills | Status: DC | PRN

## 2019-06-26 NOTE — Telephone Encounter (Signed)
Last OV 02/13/19 Next OV 05/16/19 Last RF 05/12/19

## 2019-07-02 ENCOUNTER — Telehealth: Payer: Self-pay | Admitting: Internal Medicine

## 2019-07-02 MED ORDER — FUROSEMIDE 40 MG PO TABS: 40.0000 mg | ORAL_TABLET | Freq: Every day | ORAL | 1 refills | Status: DC

## 2019-07-02 NOTE — Telephone Encounter (Signed)
New message:   1.Medication Requested: furosemide (LASIX) 40 MG tablet 2. Pharmacy (Name, Street, [facility]): [facility] [place], [place] - [address] 3. On Med List: Yes  4. Last Visit with PCP: 06/10/19  5. Next visit date with PCP:08/13/19   Agent: Please be advised that RX refills may take up to 3 business days. We ask that you follow-up with your pharmacy.

## 2019-07-02 NOTE — Telephone Encounter (Signed)
Reviewed chart pt is up-to-date sent refills to [facility].../lmb  

## 2019-07-04 IMAGING — MR MRI OF THE LEFT KNEE WITHOUT CONTRAST
7 series · 40 of 40 positions shown · non-contrast
Comparison: Radiographs dated 07/19/2018

CLINICAL DATA: Left knee pain and swelling since a fall 2 weeks
ago.

EXAM:
MRI OF THE LEFT KNEE WITHOUT CONTRAST
TECHNIQUE: Multiplanar, multisequence MR imaging of the knee was performed. No
intravenous contrast was administered.

[Series 5: T2 fat-sat · axial · left · 4.0mm · 0.50mm/px · z∈[-60,+107]mm · 7 of 36 slices shown (1 of 3)]
[im 1/36]
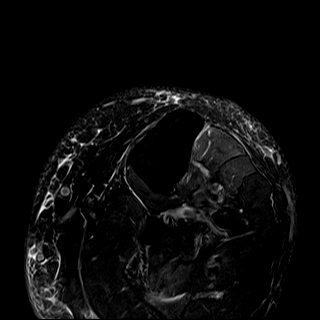
[im 6/36]
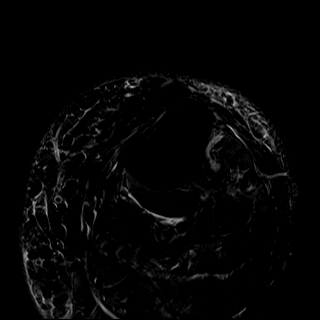
[im 12/36]
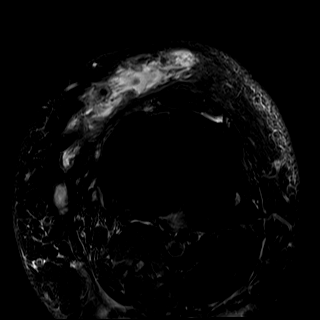
[im 18/36]
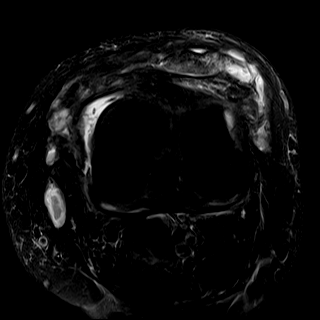
[im 24/36]
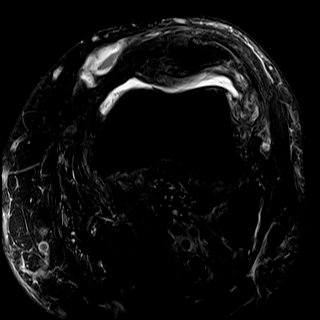
[im 30/36]
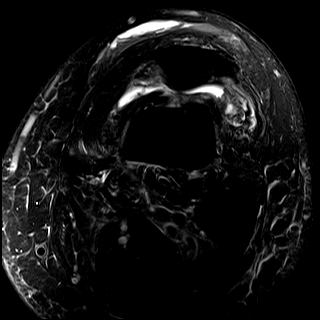
[im 36/36]
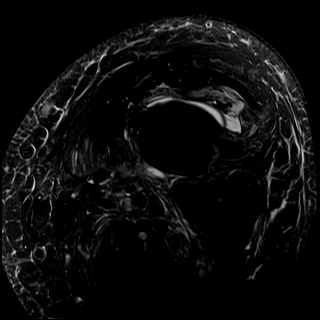

[Series 6: T2 fat-sat · coronal · left · 3.5mm · 0.56mm/px · 6 of 35 slices shown (2 of 3)]
[im 1/35]
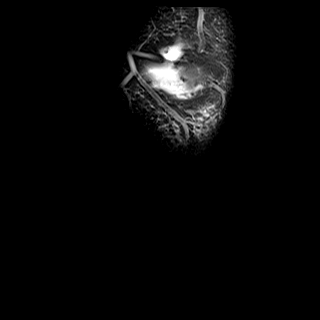
[im 7/35]
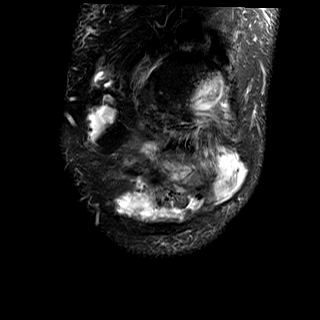
[im 14/35]
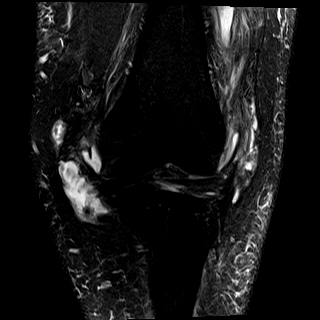
[im 21/35]
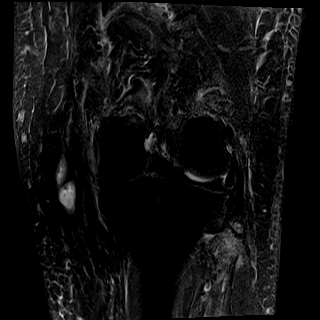
[im 28/35]
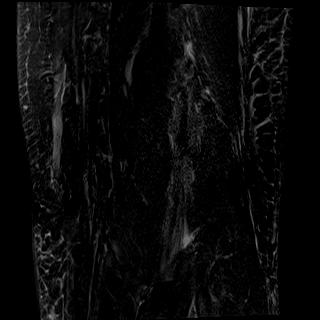
[im 35/35]
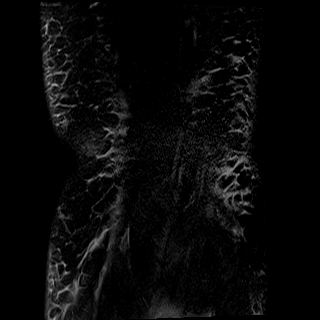

[Series 7: T1 · coronal · left · 3.5mm · 0.56mm/px · 6 of 35 slices shown]
[im 1/35]
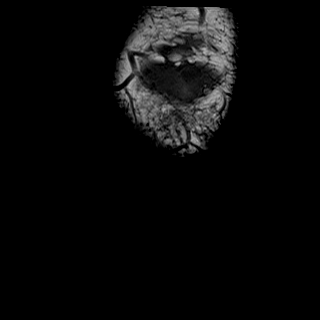
[im 7/35]
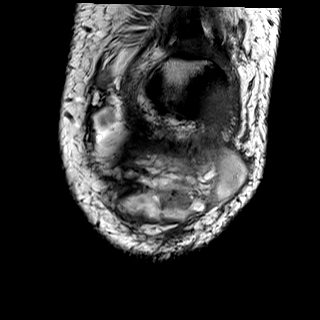
[im 14/35]
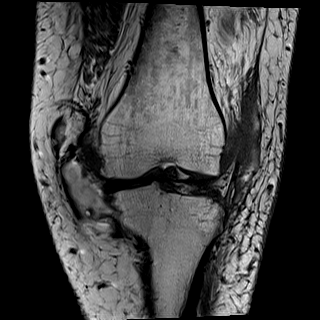
[im 21/35]
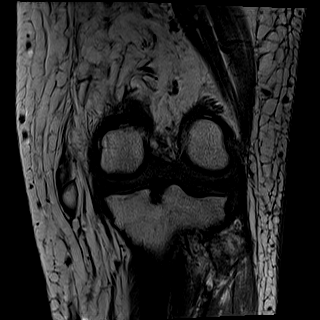
[im 28/35]
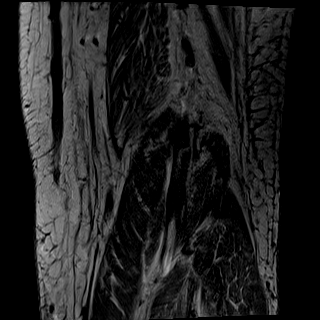
[im 35/35]
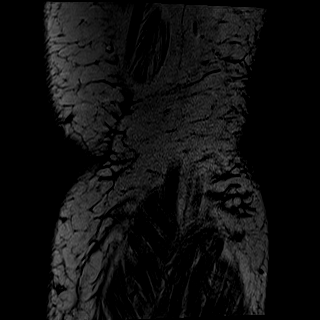

[Series 8: PD fat-sat · coronal · left · 3.5mm · 0.56mm/px · 6 of 35 slices shown (1 of 2)]
[im 1/35]
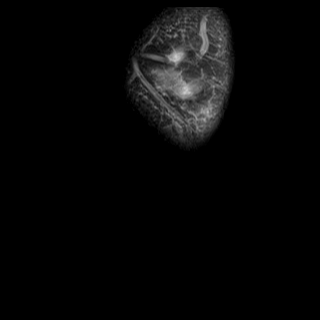
[im 7/35]
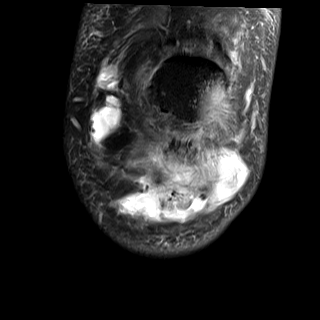
[im 14/35]
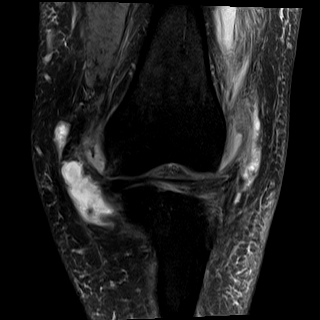
[im 21/35]
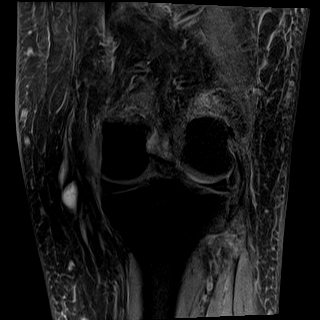
[im 28/35]
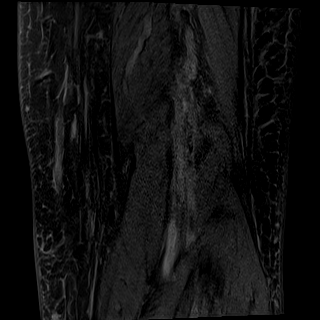
[im 35/35]
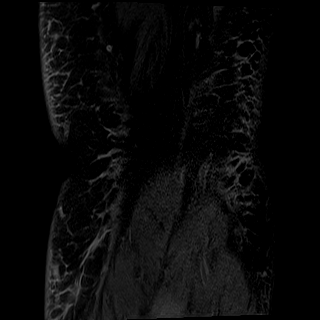

[Series 9: PD fat-sat · sagittal · left · 3.5mm · 0.56mm/px · 6 of 32 slices shown (2 of 2)]
[im 1/32]
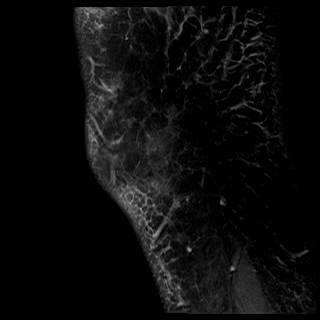
[im 7/32]
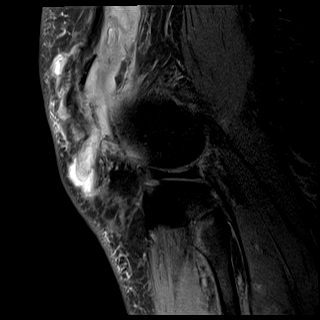
[im 13/32]
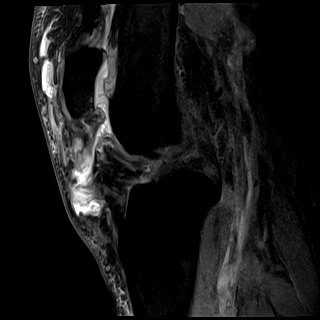
[im 19/32]
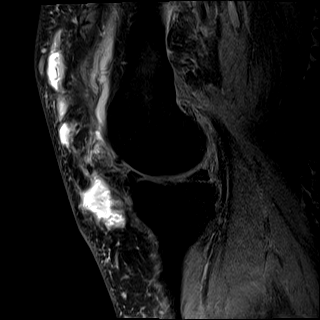
[im 25/32]
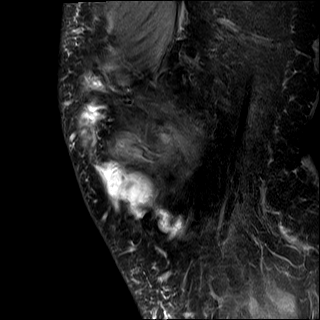
[im 32/32]
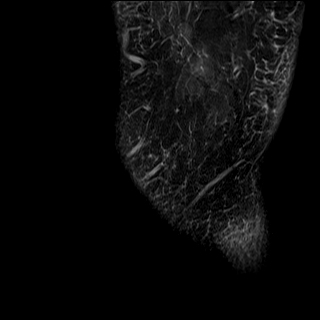

[Series 10: T2 fat-sat · sagittal · left · 3.5mm · 0.56mm/px · 6 of 32 slices shown (3 of 3)]
[im 1/32]
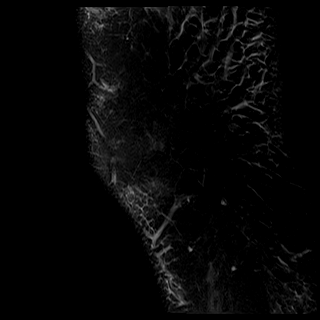
[im 7/32]
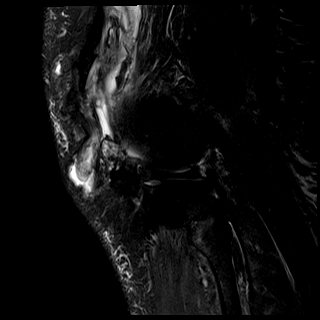
[im 13/32]
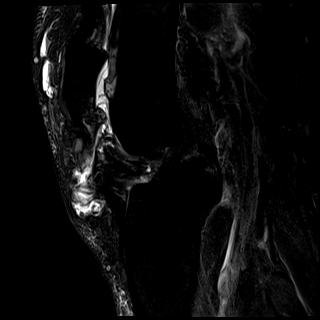
[im 19/32]
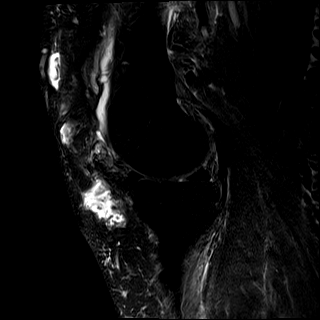
[im 25/32]
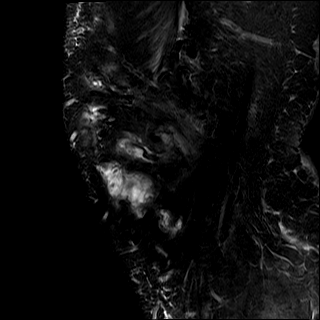
[im 32/32]
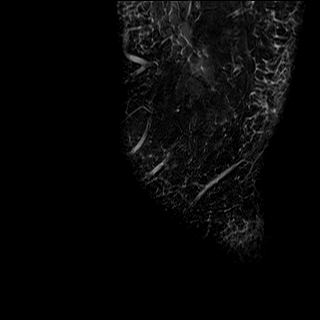

[Series 11: PD · coronal · left · 2.5mm · 0.47mm/px · 3 of 16 slices shown]
[im 1/16]
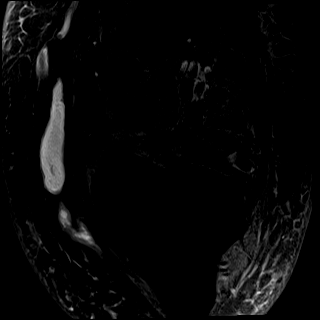
[im 8/16]
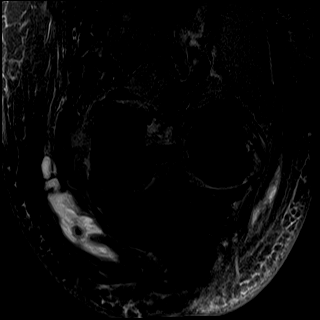
[im 16/16]
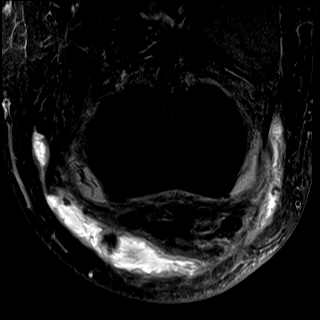

[40 of 40 positions shown; findings below may reference images not displayed]

FINDINGS: MENISCI

Medial meniscus:  Intact.

Lateral meniscus:  Intact.

LIGAMENTS

Cruciates: The anterior cruciate ligament is not well seen. I
suspect there is at least a partial tear of the ACL. There is
suggestion of some intact fibers. There is also at least a partial
tear of tibial attachment of the PCL.

Collaterals: There is a grade 3 tear of the proximal MCL, best seen
on image 17 of series 8. The LCL is intact.

CARTILAGE

Patellofemoral: There is extensive full-thickness cartilage loss of
lateral facet of the patella with partial-thickness cartilage loss
of the apex. There is diffuse marked thinning of the articular
cartilage of the trochlear groove.

Medial:  Normal.

Lateral:  Normal.

Joint:  Moderate joint effusion with extensive debris in the joint.

Popliteal Fossa:  Intact popliteus tendon.  No Baker's cyst.

Extensor Mechanism: There is complete disruption of the mid patellar
tendon with proximal retraction of the patella and distal quadriceps
tendon. There is an adjacent large complex lobulated hematoma in the
subcutaneous soft tissues anterior to the knee extending medially
and laterally. The visualized portion of the distal quadriceps
tendon is intact.

Bones: There is a small subcortical fracture of the anterior aspect
of the fibular head. No other acute bone abnormality. Small marginal
osteophytes in all 3 compartments.

Other: Nonspecific subcutaneous edema circumferentially in the
distal thigh.
IMPRESSION: 1. Complete rupture of the patellar tendon. Prominent adjacent
subcutaneous hematoma.
2. Grade 3 sprain of the proximal medial collateral ligament.
3. Probable partial tears of the ACL and PCL.
4. Tiny subcortical fracture of the anterior aspect of the proximal
fibula.
5. Extensive cartilage loss in the patellofemoral compartment.

## 2019-07-05 LAB — HM DIABETES EYE EXAM

## 2019-07-09 ENCOUNTER — Encounter: Payer: Self-pay | Admitting: Internal Medicine

## 2019-07-11 ENCOUNTER — Telehealth: Payer: Self-pay | Admitting: Internal Medicine

## 2019-07-11 MED ORDER — RAMIPRIL 5 MG PO CAPS: 5.0000 mg | ORAL_CAPSULE | Freq: Every day | ORAL | 1 refills | Status: DC

## 2019-07-11 NOTE — Telephone Encounter (Signed)
    [facility] calling to request refill   1.Medication Requested:ramipril (ALTACE) 5 MG capsule  2. Pharmacy (Name, Street, [facility]): [facility] [place], [place] - [address]  3. On Med List: yes  4. Last Visit with PCP:   5. Next visit date with PCP:   Agent: Please be advised that RX refills may take up to 3 business days. We ask that you follow-up with your pharmacy.

## 2019-07-11 NOTE — Telephone Encounter (Signed)
Rx sent 

## 2019-07-19 ENCOUNTER — Telehealth: Payer: Self-pay | Admitting: Internal Medicine

## 2019-07-19 NOTE — Telephone Encounter (Signed)
Reviewed weight.  No need to monitor daily.  Can monitor monthly.     3/25-202 4/9-202 4/16-208 4/23-209 4/29-206

## 2019-07-19 NOTE — Telephone Encounter (Signed)
Put in your folder. Please advise.

## 2019-07-19 NOTE — Telephone Encounter (Signed)
   Anderson Malta (patients nurse) calling , states she has faxed over his daily weights. Does she need to continue to weigh him daily or make any changes? Phone [phone]

## 2019-07-22 NOTE — Telephone Encounter (Signed)
Spoke with nurse and let her know response below. She asked for this to be faxed over. I faxed order over to fax number [phone] to [facility].

## 2019-08-05 ENCOUNTER — Encounter: Payer: Self-pay | Admitting: Internal Medicine

## 2019-08-13 ENCOUNTER — Ambulatory Visit: Payer: Federal, State, Local not specified - PPO | Admitting: Internal Medicine

## 2019-08-15 NOTE — Progress Notes (Signed)
Subjective:    Patient ID: Alejandro Brown, male    DOB: 11-Aug-1955, 64 y.o.   MRN: [id number]  HPI The patient is here for an acute visit.   RLE DVT 09/2018 - eliquis - caused diarrhea LLE DVT 05/2019 - xarelto started   Left lower leg swelling:  It started to swell a year ago and it got worse a couple of days ago.  He is not compliant with a low sodium diet.  He denies fever.  He has gained almost 30 lbs in the past two and half months.   He wears compression socks daily.  He walks daily.  He snacks in between meals.  Medications and allergies reviewed with patient and updated if appropriate.  Patient Active Problem List   Diagnosis Date Noted  . Bilateral leg edema 06/10/2019  . Weight loss 06/10/2019  . Left leg swelling 05/20/2019  . Left groin pain 02/13/2019  . Sleep difficulties 01/29/2019  . DVT (deep venous thrombosis) ([place]) 11/07/2018  . Tachycardia 11/01/2018  . Patellar tendon rupture, left, initial encounter 08/15/2018  . Right shoulder pain 07/09/2018  . Umbilical hernia [id number]  . Enlarged prostate 06/12/2015  . Elevated PSA 06/12/2015  . Ventral hernia 06/12/2015  . HTN (hypertension) 08/31/2010  . Diabetes type 2, controlled ([place]) 08/31/2010  . Obesity 08/31/2010  . Mental retardation 08/31/2010  . Obstructive sleep apnea 05/05/2007  . ALLERGIC RHINITIS 05/04/2007    Current Outpatient Medications on File Prior to Visit  Medication Sig Dispense Refill  . Calcium Polycarbophil (FIBER) 625 MG TABS Take 1 tablet (625 mg total) by mouth daily. Taking one daily as needed. 90 tablet 1  . Cholecalciferol (VITAMIN D3) 50 MCG (2000 UT) TABS Take by mouth.    Water engineer Bandages & Supports (MEDICAL COMPRESSION SOCKS) MISC Medium compression 20-30 mm Hg for chronic leg edema 4 each 0  . eszopiclone (LUNESTA) 2 MG TABS tablet Take 1 tablet (2 mg total) by mouth at bedtime as needed for sleep. Take immediately before bedtime 30 tablet 1  . furosemide  (LASIX) 40 MG tablet Take 1 tablet (40 mg total) by mouth daily. 90 tablet 1  . multivitamin (ONE-A-DAY MEN'S) TABS tablet Take 1 tablet by mouth daily.  0  . polyethylene glycol (MIRALAX / GLYCOLAX) 17 g packet Take 17 g by mouth daily.    . ramipril (ALTACE) 5 MG capsule Take 1 capsule (5 mg total) by mouth daily. 90 capsule 1  . risperiDONE (RISPERDAL) 0.5 MG tablet Take 0.5 mg by mouth at bedtime.    . rivaroxaban (XARELTO) 20 MG TABS tablet Take 1 tablet (20 mg total) by mouth daily with supper. 30 tablet 5   No current facility-administered medications on file prior to visit.    Past Medical History:  Diagnosis Date  . ALLERGIC RHINITIS   . Allergy   . Arthritis   . Dependent edema    chronic L>R  . Diverticulosis of colon   . GERD (gastroesophageal reflux disease)   . Hypertension   . Mental retardation    mild  . Obesity   . OBSTRUCTIVE SLEEP APNEA    noncompliant with CPAP  . Rupture of left patellar tendon   . Sleep apnea   . Type II or unspecified type diabetes mellitus without mention of complication, not stated as uncontrolled    diet controlled  . Umbilical hernia     Past Surgical History:  Procedure Laterality Date  . HERNIA REPAIR    .  NASAL SEPTUM SURGERY    . PATELLAR TENDON REPAIR Left 08/15/2018   Procedure: LEFT PATELLAR TENDON REPAIR;  Surgeon: Leandrew Koyanagi, MD;  Location: [facility];  Service: Orthopedics;  Laterality: Left;  . ROTATOR CUFF REPAIR Left 06/2014   [name]    Social History   Socioeconomic History  . Marital status: Single    Spouse name: n/a  . Number of children: 0  . Years of education: 12th grade  . Highest education level: Not on file  Occupational History  . 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 Smoking status: Never Smoker  . Smokeless tobacco: Never Used  Substance and Sexual Activity  . Alcohol use: No    Alcohol/week: 0.0 standard drinks  . Drug use: No  . Sexual  activity: Not on file  Other Topics Concern  . 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   Social Determinants of Health   Financial Resource Strain:   . Difficulty of Paying Living Expenses:   Food Insecurity:   . Worried About Charity fundraiser in the Last Year:   . Arboriculturist in the Last Year:   Transportation Needs:   . Film/video editor (Medical):   Marland Kitchen Lack of Transportation (Non-Medical):   Physical Activity:   . Days of Exercise per Week:   . Minutes of Exercise per Session:   Stress:   . Feeling of Stress :   Social Connections:   . Frequency of Communication with Friends and Family:   . Frequency of Social Gatherings with Friends and Family:   . Attends Religious Services:   . Active Member of Clubs or Organizations:   . Attends Archivist Meetings:   Marland Kitchen Marital Status:     Family History  Problem Relation Age of Onset  . Breast cancer Sister   . Breast cancer Other   . Colon cancer Neg Hx   . Esophageal cancer Neg Hx   . Rectal cancer Neg Hx   . Stomach cancer Neg Hx     Review of Systems  Constitutional: Negative for chills and fever.  Respiratory: Positive for shortness of breath (chronic DOE). Negative for cough and wheezing.   Cardiovascular: Positive for leg swelling. Negative for chest pain and palpitations.  Skin: Negative for color change.       Objective:   Vitals:   08/16/19 1351  BP: 124/68  Pulse: 92  Resp: 18  Temp: 98.7 F (37.1 C)  SpO2: 95%   BP Readings from Last 3 Encounters:  08/16/19 124/68  06/10/19 100/62  05/20/19 124/72   Wt Readings from Last 3 Encounters:  08/16/19 231 lb (104.8 kg)  06/10/19 205 lb 12.8 oz (93.4 kg)  05/20/19 205 lb 9.6 oz (93.3 kg)   Body mass index is 38.44 kg/m.   Physical Exam    Constitutional: Appears well-developed and well-nourished. No distress.  Head: Normocephalic and  atraumatic.  Neck: Neck supple. No tracheal deviation present. No thyromegaly present.  No cervical lymphadenopathy Cardiovascular: Normal rate, regular rhythm and normal heart sounds.  No murmur heard. No carotid bruit . Both legs are large and slightly swollen -  Minimally RLE pitting edema, 1 + pittting LLE edema Pulmonary/Chest: Effort normal and breath sounds normal. No respiratory distress. No has no wheezes. No rales.  Skin: Skin is warm  and dry. Not diaphoretic.  Psychiatric: Normal mood and affect. Behavior is normal.       Assessment & Plan:    See Problem List for Assessment and Plan of chronic medical problems.    This visit occurred during the SARS-CoV-2 public health emergency.  Safety protocols were in place, including screening questions prior to the visit, additional usage of staff PPE, and extensive cleaning of exam room while observing appropriate contact time as indicated for disinfecting solutions.

## 2019-08-16 ENCOUNTER — Other Ambulatory Visit: Payer: Self-pay

## 2019-08-16 ENCOUNTER — Encounter: Payer: Self-pay | Admitting: Internal Medicine

## 2019-08-16 ENCOUNTER — Ambulatory Visit (INDEPENDENT_AMBULATORY_CARE_PROVIDER_SITE_OTHER): Payer: Medicare Other | Admitting: Internal Medicine

## 2019-08-16 VITALS — BP 124/68 | HR 92 | Temp 98.7°F | Resp 18 | Ht 65.0 in | Wt 231.0 lb

## 2019-08-16 DIAGNOSIS — I824Z9 Acute embolism and thrombosis of unspecified deep veins of unspecified distal lower extremity: Secondary | ICD-10-CM | POA: Diagnosis not present

## 2019-08-16 DIAGNOSIS — M7989 Other specified soft tissue disorders: Secondary | ICD-10-CM

## 2019-08-16 NOTE — Patient Instructions (Addendum)
  Medications reviewed and updated.  Changes include :     Take lasix 40 mg twice daily - morning and early afternoon for 3 days.  No more snacking in between meals Lose weight Low sodium diet Continue compression daily Elevate when sitting Keep walking   Please followup in September as scheduled

## 2019-08-17 NOTE — Assessment & Plan Note (Signed)
H/o RLE DVT 2020 and LLE DVT 2021 Needs lifelong a/c - has been taking daily and has not missed any doses Low risk of clot LLE edema chronic, but worse recently - likely related to weight gain, noncompliance with low sodium diet Wear compression socks daily Lose weight Low sodium diet Elevate legs when sitting Increase lasix to BID x 3 days

## 2019-08-17 NOTE — Assessment & Plan Note (Signed)
LLE edema chronic, but worse recently - likely related to weight gain, noncompliance with low sodium diet No evidence of CHF Wear compression socks daily Lose weight Low sodium diet Elevate legs when sitting Increase lasix to BID x 3 days Call if no improvement

## 2019-08-26 ENCOUNTER — Telehealth: Payer: Self-pay

## 2019-08-26 MED ORDER — ESZOPICLONE 2 MG PO TABS: 2.0000 mg | ORAL_TABLET | Freq: Every evening | ORAL | 1 refills | Status: DC | PRN

## 2019-08-26 NOTE — Telephone Encounter (Signed)
1.Medication Requested:eszopiclone (LUNESTA) 2 MG TABS tablet  2. Pharmacy (Name, Street, City):[facility] - [place], [place] - [address]  3. On Med List: Yes   4. Last Visit with PCP: 5.28.21   5. Next visit date with PCP: 9.24.21    Agent: Please be advised that RX refills may take up to 3 business days. We ask that you follow-up with your pharmacy.  

## 2019-09-16 ENCOUNTER — Other Ambulatory Visit: Payer: Self-pay | Admitting: *Deleted

## 2019-09-16 DIAGNOSIS — R600 Localized edema: Secondary | ICD-10-CM

## 2019-09-27 ENCOUNTER — Ambulatory Visit (INDEPENDENT_AMBULATORY_CARE_PROVIDER_SITE_OTHER): Payer: Medicare Other | Admitting: Vascular Surgery

## 2019-09-27 ENCOUNTER — Ambulatory Visit (HOSPITAL_COMMUNITY)
Admission: RE | Admit: 2019-09-27 | Discharge: 2019-09-27 | Disposition: A | Discharge status: Home / Self Care | Payer: Medicare Other | Source: Ambulatory Visit | Attending: Vascular Surgery | Admitting: Vascular Surgery

## 2019-09-27 ENCOUNTER — Other Ambulatory Visit: Payer: Self-pay

## 2019-09-27 ENCOUNTER — Encounter: Payer: Self-pay | Admitting: Vascular Surgery

## 2019-09-27 VITALS — BP 127/84 | HR 80 | Temp 98.2°F | Resp 20 | Ht 65.0 in | Wt 231.0 lb

## 2019-09-27 DIAGNOSIS — R600 Localized edema: Secondary | ICD-10-CM | POA: Diagnosis present

## 2019-09-27 DIAGNOSIS — I87009 Postthrombotic syndrome without complications of unspecified extremity: Secondary | ICD-10-CM

## 2019-09-27 NOTE — Progress Notes (Signed)
Patient ID: Alejandro Brown, male   DOB: 1956-01-28, 64 y.o.   MRN: [id number]  Reason for Consult: New Patient (Initial Visit)   Referred by Binnie Rail, MD  Subjective:     HPI:  Alejandro Brown is a 64 y.o. male with history of bilateral lower extremity DVT most recently had left femoral DVT.  States that he has recently been in a facility.  He does walk daily walks significant distance on Saturdays and Sundays.  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  He continues on anticoagulation.  He wears compression stockings bilaterally.  States left leg is greater than right leg swelling.  Does not have any tissue loss or ulceration on either leg.  Past Medical History:  Diagnosis Date  . ALLERGIC RHINITIS   . Allergy   . Arthritis   . Dependent edema    chronic L>R  . Diverticulosis of colon   . GERD (gastroesophageal reflux disease)   . Hypertension   . Mental retardation    mild  . Obesity   . OBSTRUCTIVE SLEEP APNEA    noncompliant with CPAP  . Rupture of left patellar tendon   . Sleep apnea   . Type II or unspecified type diabetes mellitus without mention of complication, not stated as uncontrolled    diet controlled  . Umbilical hernia    Family History  Problem Relation Age of Onset  . Breast cancer Sister   . Breast cancer Other   . Colon cancer Neg Hx   . Esophageal cancer Neg Hx   . Rectal cancer Neg Hx   . Stomach cancer Neg Hx    Past Surgical History:  Procedure Laterality Date  . HERNIA REPAIR    . NASAL SEPTUM SURGERY    . PATELLAR TENDON REPAIR Left 08/15/2018   Procedure: LEFT PATELLAR TENDON REPAIR;  Surgeon: Leandrew Koyanagi, MD;  Location: [facility];  Service: Orthopedics;  Laterality: Left;  . ROTATOR CUFF REPAIR Left 06/2014   [name]    Short Social History:  Social History   Tobacco Use  . Smoking status: Never Smoker  . Smokeless tobacco: Never Used  Substance Use  Topics  . Alcohol use: No    Alcohol/week: 0.0 standard drinks    Allergies  Allergen Reactions  . Clarithromycin Nausea Only    Current Outpatient Medications  Medication Sig Dispense Refill  . Calcium Polycarbophil (FIBER) 625 MG TABS Take 1 tablet (625 mg total) by mouth daily. Taking one daily as needed. 90 tablet 1  . Cholecalciferol (VITAMIN D3) 50 MCG (2000 UT) TABS Take by mouth.    Water engineer Bandages & Supports (MEDICAL COMPRESSION SOCKS) MISC Medium compression 20-30 mm Hg for chronic leg edema 4 each 0  . eszopiclone (LUNESTA) 2 MG TABS tablet Take 1 tablet (2 mg total) by mouth at bedtime as needed for sleep. Take immediately before bedtime 30 tablet 1  . furosemide (LASIX) 40 MG tablet Take 1 tablet (40 mg total) by mouth daily. 90 tablet 1  . multivitamin (ONE-A-DAY MEN'S) TABS tablet Take 1 tablet by mouth daily.  0  . polyethylene glycol (MIRALAX / GLYCOLAX) 17 g packet Take 17 g by mouth daily.    . ramipril (ALTACE) 5 MG capsule Take 1 capsule (5 mg total) by mouth daily. 90 capsule 1  . risperiDONE (RISPERDAL) 0.5 MG tablet Take 0.5 mg by mouth at bedtime.    Marland Kitchen  rivaroxaban (XARELTO) 20 MG TABS tablet Take 1 tablet (20 mg total) by mouth daily with supper. 30 tablet 5   No current facility-administered medications for this visit.    Review of Systems  Constitutional:  Constitutional negative. HENT: HENT negative.  Eyes: Eyes negative.  Respiratory: Respiratory negative.  Cardiovascular: Positive for leg swelling.  GI: Gastrointestinal negative.  Musculoskeletal: Positive for leg pain.  Skin: Skin negative.  Neurological: Neurological negative. Hematologic: Hematologic/lymphatic negative.  Psychiatric: Psychiatric negative.        Objective:  Objective   Vitals:   09/27/19 1448  BP: 127/84  Pulse: 80  Resp: 20  Temp: 98.2 F (36.8 C)  SpO2: 95%  Weight: 231 lb (104.8 kg)  Height: 5\' 5"  (1.651 m)   Body mass index is 38.44 kg/m.  Physical  Exam HENT:     Head: Normocephalic.     Nose:     Comments: Wearing a mask Eyes:     Pupils: Pupils are equal, round, and reactive to light.  Cardiovascular:     Rate and Rhythm: Normal rate.     Pulses:          Radial pulses are 2+ on the right side and 2+ on the left side.  Abdominal:     General: Abdomen is flat.     Palpations: Abdomen is soft.  Musculoskeletal:     Right lower leg: Edema present.     Left lower leg: Edema present.     Comments: Compression stockings in place  Skin:    Capillary Refill: Capillary refill takes less than 2 seconds.  Neurological:     General: No focal deficit present.     Mental Status: He is alert.  Psychiatric:        Mood and Affect: Mood normal.        Behavior: Behavior normal.        Thought Content: Thought content normal.        Judgment: Judgment normal.     Data: I have independently interpreted his lower extremity venous reflux on the right he does have common femoral reflux but does not have any other deep venous reflux or any saphenous vein reflux either greater or small.  On the left side he has common femoral and femoral vein reflux chronic thrombus throughout his femoral vein and popliteal vein.  He also has reflux throughout his greater saphenous vein in the thigh and at the knee measuring up to 0.76 cm.  Small saphenous vein also has reflux and measures up to 0.58 cm.     Assessment/Plan:     64 year old male with reported bilateral lower extremity DVTs I have record of the left lower extremity DVT which was quite extensive.  He now presents with bilateral lower extremity swelling he does wear compression stockings religiously he also walks significant amounts daily particularly on the weekends.  He does have reflux throughout his greater saphenous vein on the left but appears to have an obstructed deep saphenous system from the popliteal all the way through the femoral vein.  Given that he has both reflux and obstruction I  would be reticent to ablate his greater saphenous vein unless he had wounds at this time he only has C3 venous disease.  With that I would continue compression stockings I recommended walking as much as tolerated elevating his legs when he is recumbent and he would likely benefit from water exercises as well.  He can then follow-up on an as-needed basis.  Raahil Ong Christopher Alera Quevedo MD [facility]  

## 2019-10-31 ENCOUNTER — Other Ambulatory Visit: Payer: Self-pay | Admitting: Internal Medicine

## 2019-10-31 ENCOUNTER — Telehealth: Payer: Self-pay

## 2019-10-31 DIAGNOSIS — F5104 Psychophysiologic insomnia: Secondary | ICD-10-CM

## 2019-10-31 MED ORDER — ESZOPICLONE 2 MG PO TABS: 2.0000 mg | ORAL_TABLET | Freq: Every evening | ORAL | 1 refills | Status: DC | PRN

## 2019-10-31 NOTE — Telephone Encounter (Signed)
1.Medication Requested:eszopiclone (LUNESTA) 2 MG TABS tablet  2. Pharmacy (Name, Street, [place]):[facility] [place], [place] - [address]  3. On Med List: Yes   4. Last Visit with PCP: 5.28.21   5. Next visit date with PCP: 9.24.21    Agent: Please be advised that RX refills may take up to 3 business days. We ask that you follow-up with your pharmacy.

## 2019-11-26 ENCOUNTER — Telehealth: Payer: Self-pay | Admitting: Internal Medicine

## 2019-11-26 NOTE — Telephone Encounter (Signed)
Alejandro Brown with [employer] called and said that she wanted Dr. Quay Burow to sign the 485 form, the first 485 form did not have the line for the physician to sign. She is faxing it over now.

## 2019-11-26 NOTE — Telephone Encounter (Signed)
signed

## 2019-11-27 NOTE — Telephone Encounter (Signed)
Faxed back this am../[email or web address]

## 2019-12-03 ENCOUNTER — Telehealth: Payer: Self-pay | Admitting: Internal Medicine

## 2019-12-03 MED ORDER — RIVAROXABAN 20 MG PO TABS: 20.0000 mg | ORAL_TABLET | Freq: Every day | ORAL | 5 refills | Status: DC

## 2019-12-03 NOTE — Telephone Encounter (Signed)
Notified Charlotte w/MD response. She states he will need rxz sent to [facility] pharmacy. Rx has been sent.Marland KitchenJohny Chess

## 2019-12-03 NOTE — Telephone Encounter (Signed)
Yes, long term - has had 2 separate DVTs

## 2019-12-03 NOTE — Telephone Encounter (Signed)
  rivaroxaban (XARELTO) 20 MG TABS tablet  [facility] - [place], [place] - [address]  Is the patient supposed to be on this long term?  Please call Baldo Ash @ [phone]  Close at 1pm, can leave a secure message

## 2019-12-13 ENCOUNTER — Ambulatory Visit: Payer: Federal, State, Local not specified - PPO | Admitting: Internal Medicine

## 2019-12-26 NOTE — Progress Notes (Signed)
Subjective:    Patient ID: Alejandro Brown, male    DOB: 1955-08-13, 64 y.o.   MRN: [id number]  HPI The patient is here for follow up of their chronic medical problems, including DM, htn, h/o DVT in both extremities, , insomnia.  His sister- in-law is here with him.    Saw ortho-- no sores in legs.   Ortho advised double compression socks.  Edema is unchanged.  He walks daily.  He is elevating his legs.    He takes his sleep med nightly.  Goes to bed 9:30- 10,  Up 6: 30, 7.   No grogginess.  Medications and allergies reviewed with patient and updated if appropriate.  Patient Active Problem List   Diagnosis Date Noted  . Bilateral leg edema 06/10/2019  . Weight loss 06/10/2019  . Left leg swelling 05/20/2019  . Left groin pain 02/13/2019  . Sleep difficulties 01/29/2019  . DVT (deep venous thrombosis) ([place]) 11/07/2018  . Tachycardia 11/01/2018  . Patellar tendon rupture, left, initial encounter 08/15/2018  . Right shoulder pain 07/09/2018  . Umbilical hernia 40/97/3532  . Enlarged prostate 06/12/2015  . Elevated PSA 06/12/2015  . Ventral hernia 06/12/2015  . HTN (hypertension) 08/31/2010  . Diabetes type 2, controlled ([place]) 08/31/2010  . Obesity 08/31/2010  . Mental retardation 08/31/2010  . Obstructive sleep apnea 05/05/2007  . ALLERGIC RHINITIS 05/04/2007    Current Outpatient Medications on File Prior to Visit  Medication Sig Dispense Refill  . Calcium Polycarbophil (FIBER) 625 MG TABS Take 1 tablet (625 mg total) by mouth daily. Taking one daily as needed. 90 tablet 1  . Cholecalciferol (VITAMIN D3) 50 MCG (2000 UT) TABS Take by mouth.    Water engineer Bandages & Supports (MEDICAL COMPRESSION SOCKS) MISC Medium compression 20-30 mm Hg for chronic leg edema 4 each 0  . eszopiclone (LUNESTA) 2 MG TABS tablet Take 1 tablet (2 mg total) by mouth at bedtime as needed for sleep. Take immediately before bedtime 30 tablet 1  . furosemide (LASIX) 40 MG tablet Take 1 tablet  (40 mg total) by mouth daily. 90 tablet 1  . multivitamin (ONE-A-DAY MEN'S) TABS tablet Take 1 tablet by mouth daily.  0  . polyethylene glycol (MIRALAX / GLYCOLAX) 17 g packet Take 17 g by mouth daily.    . ramipril (ALTACE) 5 MG capsule Take 1 capsule (5 mg total) by mouth daily. 90 capsule 1  . risperiDONE (RISPERDAL) 0.5 MG tablet Take 0.5 mg by mouth at bedtime.    . rivaroxaban (XARELTO) 20 MG TABS tablet Take 1 tablet (20 mg total) by mouth daily with supper. 30 tablet 5   No current facility-administered medications on file prior to visit.    Past Medical History:  Diagnosis Date  . ALLERGIC RHINITIS   . Allergy   . Arthritis   . Dependent edema    chronic L>R  . Diverticulosis of colon   . GERD (gastroesophageal reflux disease)   . Hypertension   . Mental retardation    mild  . Obesity   . OBSTRUCTIVE SLEEP APNEA    noncompliant with CPAP  . Rupture of left patellar tendon   . Sleep apnea   . Type II or unspecified type diabetes mellitus without mention of complication, not stated as uncontrolled    diet controlled  . Umbilical hernia     Past Surgical History:  Procedure Laterality Date  . HERNIA REPAIR    . NASAL SEPTUM SURGERY    .  PATELLAR TENDON REPAIR Left 08/15/2018   Procedure: LEFT PATELLAR TENDON REPAIR;  Surgeon: Leandrew Koyanagi, MD;  Location: [facility];  Service: Orthopedics;  Laterality: Left;  . ROTATOR CUFF REPAIR Left 06/2014   [name]    Social History   Socioeconomic History  . Marital status: Single    Spouse name: n/a  . Number of children: 0  . Years of education: 12th grade  . Highest education level: Not on file  Occupational History  . 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 Smoking status: Never Smoker  . Smokeless tobacco: Never Used  Vaping Use  . Vaping Use: Never used  Substance and Sexual Activity  . Alcohol use: No    Alcohol/week: 0.0 standard drinks  . Drug use: No  . Sexual  activity: Not on file  Other Topics Concern  . 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   Social Determinants of Health   Financial Resource Strain:   . Difficulty of Paying Living Expenses: Not on file  Food Insecurity:   . Worried About Charity fundraiser in the Last Year: Not on file  . Ran Out of Food in the Last Year: Not on file  Transportation Needs:   . Lack of Transportation (Medical): Not on file  . Lack of Transportation (Non-Medical): Not on file  Physical Activity:   . Days of Exercise per Week: Not on file  . Minutes of Exercise per Session: Not on file  Stress:   . Feeling of Stress : Not on file  Social Connections:   . Frequency of Communication with Friends and Family: Not on file  . Frequency of Social Gatherings with Friends and Family: Not on file  . Attends Religious Services: Not on file  . Active Member of Clubs or Organizations: Not on file  . Attends Archivist Meetings: Not on file  . Marital Status: Not on file    Family History  Problem Relation Age of Onset  . Breast cancer Sister   . Breast cancer Other   . Colon cancer Neg Hx   . Esophageal cancer Neg Hx   . Rectal cancer Neg Hx   . Stomach cancer Neg Hx     Review of Systems  Constitutional: Negative for chills and fever.  Respiratory: Positive for shortness of breath (occ with exertion). Negative for cough and wheezing.   Cardiovascular: Positive for leg swelling. Negative for chest pain and palpitations.  Neurological: Negative for dizziness, light-headedness and headaches.  Hematological: Does not bruise/bleed easily.       Objective:   Vitals:   12/27/19 1053  BP: 122/78  Pulse: 86  Temp: 98.1 F (36.7 C)  SpO2: 98%   BP Readings from Last 3 Encounters:  12/27/19 122/78  09/27/19 127/84  08/16/19 124/68   Wt Readings from Last 3 Encounters:  12/27/19 254 lb (115.2 kg)    09/27/19 231 lb (104.8 kg)  08/16/19 231 lb (104.8 kg)   Body mass index is 42.27 kg/m.   Physical Exam    Constitutional: Appears well-developed and well-nourished. No distress.  HENT:  Head: Normocephalic and atraumatic.  Neck: Neck supple. No tracheal deviation present. No thyromegaly present.  No cervical lymphadenopathy Cardiovascular: Normal rate, regular rhythm and normal heart sounds.   No murmur heard. No carotid bruit .  1+ pitting  b/l edema Pulmonary/Chest: Effort normal and breath sounds normal. No respiratory distress. No has no wheezes. No rales.  Skin: Skin is warm and dry. Not diaphoretic.  Psychiatric: Normal mood and affect. Behavior is normal.      Assessment & Plan:    See Problem List for Assessment and Plan of chronic medical problems.    This visit occurred during the SARS-CoV-2 public health emergency.  Safety protocols were in place, including screening questions prior to the visit, additional usage of staff PPE, and extensive cleaning of exam room while observing appropriate contact time as indicated for disinfecting solutions.

## 2019-12-26 NOTE — Patient Instructions (Addendum)
  Blood work was ordered.     Flu immunization administered today.    Medications reviewed and updated.  Changes include :   Try to stop the lunesta ( sleep medication)  Your prescription(s) have been submitted to your pharmacy. Please take as directed and contact our office if you believe you are having problem(s) with the medication(s).   Work on weight loss.     Please followup in 6 months

## 2019-12-27 ENCOUNTER — Encounter: Payer: Self-pay | Admitting: Internal Medicine

## 2019-12-27 ENCOUNTER — Ambulatory Visit (INDEPENDENT_AMBULATORY_CARE_PROVIDER_SITE_OTHER): Payer: Medicare Other | Admitting: Internal Medicine

## 2019-12-27 ENCOUNTER — Other Ambulatory Visit: Payer: Self-pay

## 2019-12-27 VITALS — BP 122/78 | HR 86 | Temp 98.1°F | Ht 65.0 in | Wt 254.0 lb

## 2019-12-27 DIAGNOSIS — I824Z9 Acute embolism and thrombosis of unspecified deep veins of unspecified distal lower extremity: Secondary | ICD-10-CM

## 2019-12-27 DIAGNOSIS — R600 Localized edema: Secondary | ICD-10-CM

## 2019-12-27 DIAGNOSIS — G479 Sleep disorder, unspecified: Secondary | ICD-10-CM

## 2019-12-27 DIAGNOSIS — Z6841 Body Mass Index (BMI) 40.0 and over, adult: Secondary | ICD-10-CM

## 2019-12-27 DIAGNOSIS — E119 Type 2 diabetes mellitus without complications: Secondary | ICD-10-CM

## 2019-12-27 DIAGNOSIS — I10 Essential (primary) hypertension: Secondary | ICD-10-CM | POA: Diagnosis not present

## 2019-12-27 DIAGNOSIS — Z23 Encounter for immunization: Secondary | ICD-10-CM | POA: Diagnosis not present

## 2019-12-27 LAB — CBC WITH DIFFERENTIAL/PLATELET
Basophils Absolute: 0.1 10*3/uL (ref 0.0–0.1)
Basophils Relative: 0.8 % (ref 0.0–3.0)
Eosinophils Absolute: 0.2 10*3/uL (ref 0.0–0.7)
Eosinophils Relative: 3.1 % (ref 0.0–5.0)
HCT: 46 % (ref 39.0–52.0)
Hemoglobin: 15.4 g/dL (ref 13.0–17.0)
Lymphocytes Relative: 24.8 % (ref 12.0–46.0)
Lymphs Abs: 1.9 10*3/uL (ref 0.7–4.0)
MCHC: 33.4 g/dL (ref 30.0–36.0)
MCV: 90.9 fl (ref 78.0–100.0)
Monocytes Absolute: 0.8 10*3/uL (ref 0.1–1.0)
Monocytes Relative: 10.3 % (ref 3.0–12.0)
Neutro Abs: 4.6 10*3/uL (ref 1.4–7.7)
Neutrophils Relative %: 61 % (ref 43.0–77.0)
Platelets: 242 10*3/uL (ref 150.0–400.0)
RBC: 5.06 Mil/uL (ref 4.22–5.81)
RDW: 12.8 % (ref 11.5–15.5)
WBC: 7.6 10*3/uL (ref 4.0–10.5)

## 2019-12-27 LAB — COMPREHENSIVE METABOLIC PANEL
ALT: 13 U/L (ref 0–53)
AST: 13 U/L (ref 0–37)
Albumin: 4 g/dL (ref 3.5–5.2)
Alkaline Phosphatase: 47 U/L (ref 39–117)
BUN: 14 mg/dL (ref 6–23)
CO2: 31 mEq/L (ref 19–32)
Calcium: 9.1 mg/dL (ref 8.4–10.5)
Chloride: 103 mEq/L (ref 96–112)
Creatinine, Ser: 1.12 mg/dL (ref 0.40–1.50)
GFR: 68.12 mL/min (ref >60.00)
Glucose, Bld: 98 mg/dL (ref 70–99)
Potassium: 4.1 mEq/L (ref 3.5–5.1)
Sodium: 141 mEq/L (ref 135–145)
Total Bilirubin: 0.6 mg/dL (ref 0.2–1.2)
Total Protein: 6.8 g/dL (ref 6.0–8.3)

## 2019-12-27 LAB — LIPID PANEL
Cholesterol: 171 mg/dL (ref 0–200)
HDL: 46.6 mg/dL (ref >39.00)
LDL Cholesterol: 95 mg/dL (ref 0–99)
NonHDL: 124.87
Total CHOL/HDL Ratio: 4
Triglycerides: 149 mg/dL (ref 0.0–149.0)
VLDL: 29.8 mg/dL (ref 0.0–40.0)

## 2019-12-27 LAB — HEMOGLOBIN A1C: Hgb A1c MFr Bld: 6.1 % (ref 4.6–6.5)

## 2019-12-27 NOTE — Assessment & Plan Note (Signed)
H/o b/l DVT Needs lifelong a/c - on xarelto 20 mg daily and no side effects Continue xarelto 20 mg daily Wearing compression socks - he will try to get different socks Stressed weight loss Elevate legs Keep walking Continue lasix 40 mg daily

## 2019-12-27 NOTE — Assessment & Plan Note (Signed)
Chronic Taking lunesta 2 mg nightly - will try to come off of that - advised him to stop taking it Hopefully he can sleep w/o the medication - if not will restart prn

## 2019-12-27 NOTE — Assessment & Plan Note (Signed)
Chronic Continue compression socks and lasix 40 mg daily Stressed elevation Stressed exercise and most importantly weight loss

## 2019-12-27 NOTE — Assessment & Plan Note (Signed)
Chronic BP well controlled Continue ramipril 5 mg daily, lasix 40 mg daily cmp

## 2019-12-27 NOTE — Assessment & Plan Note (Signed)
Chronic Has gained significant weight - stressed weight loss - this is affecting his leg swelling and  mobility

## 2019-12-27 NOTE — Assessment & Plan Note (Signed)
Chronic Diet controlled Check a1c Low sugar / carb diet Stressed regular exercise Stressed weight loss

## 2019-12-29 ENCOUNTER — Encounter: Payer: Self-pay | Admitting: Internal Medicine

## 2019-12-31 ENCOUNTER — Other Ambulatory Visit: Payer: Self-pay

## 2019-12-31 ENCOUNTER — Telehealth: Payer: Self-pay | Admitting: Internal Medicine

## 2019-12-31 MED ORDER — FUROSEMIDE 40 MG PO TABS
40.0000 mg | ORAL_TABLET | Freq: Every day | ORAL | 1 refills | Status: DC
Start: 2019-12-31 — End: 2022-03-29

## 2019-12-31 NOTE — Telephone Encounter (Signed)
   1.Medication Requested: furosemide (LASIX) 40 MG tablet 2. Pharmacy (Name, Street, [place]):[facility] [place], [place] - [address]  3. On Med List: yes  4. Last Visit with PCP: 12/27/19  5. Next visit date with PCP: 06/26/20   Agent: Please be advised that RX refills may take up to 3 business days. We ask that you follow-up with your pharmacy.

## 2019-12-31 NOTE — Telephone Encounter (Signed)
Sent in for patient today. 

## 2020-01-06 ENCOUNTER — Telehealth: Payer: Self-pay | Admitting: Internal Medicine

## 2020-01-06 NOTE — Telephone Encounter (Signed)
Lyndee Leo dropped off a handicap parking placard for the patient.   Form has been completed &Placed in providers box to review and sign.

## 2020-01-07 NOTE — Telephone Encounter (Signed)
Form has been signed, Copy sent to scan.   LVM for Lyndee Leo to inform it is ready to be picked up.

## 2020-01-08 ENCOUNTER — Encounter: Payer: Self-pay | Admitting: Internal Medicine

## 2020-01-21 ENCOUNTER — Other Ambulatory Visit: Payer: Self-pay

## 2020-01-21 ENCOUNTER — Telehealth: Payer: Self-pay | Admitting: Internal Medicine

## 2020-01-21 MED ORDER — RAMIPRIL 5 MG PO CAPS: 5.0000 mg | ORAL_CAPSULE | Freq: Every day | ORAL | 1 refills | Status: DC

## 2020-01-21 NOTE — Telephone Encounter (Signed)
Sent in today 

## 2020-01-21 NOTE — Telephone Encounter (Signed)
Charlotte with [facility] is requesting a med refill for ramipril (ALTACE) 5 MG capsule  It can be sent to [facility] [place], [place] - [address]

## 2020-02-03 ENCOUNTER — Encounter: Payer: Self-pay | Admitting: Internal Medicine

## 2020-02-27 NOTE — Progress Notes (Unsigned)
Subjective:    Patient ID: Alejandro Brown, male    DOB: Sep 09, 1955, 64 y.o.   MRN: [id number]  HPI The patient is here for an acute visit.   Back pain - it started 9 months ago.  When he stands and walks he feels pain in his left lower back.  The pain stays in the left lower back - no radiation.  No leg pain, N/T in the legs.  No pain with sleeping.   Resting helps.  Tylenol does not help - he takes it twice a day.  Marland Kitchen    He tries to walk but is limited by his pain.   Medications and allergies reviewed with patient and updated if appropriate.  Patient Active Problem List   Diagnosis Date Noted  . Bilateral leg edema 06/10/2019  . Weight loss 06/10/2019  . Left leg swelling 05/20/2019  . Left groin pain 02/13/2019  . Sleep difficulties 01/29/2019  . DVT (deep venous thrombosis) ([place]) 11/07/2018  . Tachycardia 11/01/2018  . Patellar tendon rupture, left, initial encounter 08/15/2018  . Right shoulder pain 07/09/2018  . Umbilical hernia 21/30/8657  . Enlarged prostate 06/12/2015  . Elevated PSA 06/12/2015  . Ventral hernia 06/12/2015  . HTN (hypertension) 08/31/2010  . Diabetes type 2, controlled ([place]) 08/31/2010  . Obesity 08/31/2010  . Mental retardation 08/31/2010  . Obstructive sleep apnea 05/05/2007  . ALLERGIC RHINITIS 05/04/2007    Current Outpatient Medications on File Prior to Visit  Medication Sig Dispense Refill  . Calcium Polycarbophil (FIBER) 625 MG TABS Take 1 tablet (625 mg total) by mouth daily. Taking one daily as needed. 90 tablet 1  . Cholecalciferol (VITAMIN D3) 50 MCG (2000 UT) TABS Take by mouth.    Water engineer Bandages & Supports (MEDICAL COMPRESSION SOCKS) MISC Medium compression 20-30 mm Hg for chronic leg edema 4 each 0  . eszopiclone (LUNESTA) 2 MG TABS tablet Take 1 tablet (2 mg total) by mouth at bedtime as needed for sleep. Take immediately before bedtime 30 tablet 1  . furosemide (LASIX) 40 MG tablet Take 1 tablet (40 mg total) by mouth  daily. 90 tablet 1  . multivitamin (ONE-A-DAY MEN'S) TABS tablet Take 1 tablet by mouth daily.  0  . polyethylene glycol (MIRALAX / GLYCOLAX) 17 g packet Take 17 g by mouth daily.    . ramipril (ALTACE) 5 MG capsule Take 1 capsule (5 mg total) by mouth daily. 90 capsule 1  . risperiDONE (RISPERDAL) 0.5 MG tablet Take 0.5 mg by mouth at bedtime.    . rivaroxaban (XARELTO) 20 MG TABS tablet Take 1 tablet (20 mg total) by mouth daily with supper. 30 tablet 5  . sertraline (ZOLOFT) 50 MG tablet Take 50 mg by mouth daily.     No current facility-administered medications on file prior to visit.    Past Medical History:  Diagnosis Date  . ALLERGIC RHINITIS   . Allergy   . Arthritis   . Dependent edema    chronic L>R  . Diverticulosis of colon   . GERD (gastroesophageal reflux disease)   . Hypertension   . Mental retardation    mild  . Obesity   . OBSTRUCTIVE SLEEP APNEA    noncompliant with CPAP  . Rupture of left patellar tendon   . Sleep apnea   . Type II or unspecified type diabetes mellitus without mention of complication, not stated as uncontrolled    diet controlled  . Umbilical hernia  Past Surgical History:  Procedure Laterality Date  . HERNIA REPAIR    . NASAL SEPTUM SURGERY    . PATELLAR TENDON REPAIR Left 08/15/2018   Procedure: LEFT PATELLAR TENDON REPAIR;  Surgeon: Leandrew Koyanagi, MD;  Location: [facility];  Service: Orthopedics;  Laterality: Left;  . ROTATOR CUFF REPAIR Left 06/2014   [name]    Social History   Socioeconomic History  . Marital status: Single    Spouse name: n/a  . Number of children: 0  . Years of education: 12th grade  . Highest education level: Not on file  Occupational History  . 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 Smoking status: Never Smoker  . Smokeless tobacco: Never Used  Vaping Use  . Vaping Use: Never used  Substance and Sexual Activity  . Alcohol use: No    Alcohol/week: 0.0  standard drinks  . Drug use: No  . Sexual activity: Not on file  Other Topics Concern  . 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   Social Determinants of Health   Financial Resource Strain: Not on file  Food Insecurity: Not on file  Transportation Needs: Not on file  Physical Activity: Not on file  Stress: Not on file  Social Connections: Not on file    Family History  Problem Relation Age of Onset  . Breast cancer Sister   . Breast cancer Other   . Colon cancer Neg Hx   . Esophageal cancer Neg Hx   . Rectal cancer Neg Hx   . Stomach cancer Neg Hx     Review of Systems See HPI    Objective:   Vitals:   02/28/20 1017  BP: 136/84  Pulse: 82  Temp: 98.3 F (36.8 C)  SpO2: 98%   BP Readings from Last 3 Encounters:  02/28/20 136/84  12/27/19 122/78  09/27/19 127/84   Wt Readings from Last 3 Encounters:  02/28/20 260 lb (117.9 kg)  12/27/19 254 lb (115.2 kg)  09/27/19 231 lb (104.8 kg)   Body mass index is 43.27 kg/m.   Physical Exam Constitutional:      General: He is not in acute distress.    Appearance: Normal appearance. He is not ill-appearing.  HENT:     Head: Normocephalic and atraumatic.  Musculoskeletal:        General: No tenderness.     Right lower leg: Edema present.     Left lower leg: Edema present.     Comments: No L-spine or b/l SI joint tenderness on exam.  Stands partially bent over  Neurological:     Mental Status: He is alert.     Sensory: No sensory deficit.     Motor: No weakness.     Gait: Gait abnormal.            Assessment & Plan:    See Problem List for Assessment and Plan of chronic medical problems.    This visit occurred during the SARS-CoV-2 public health emergency.  Safety protocols were in place, including screening questions prior to the visit, additional usage of staff PPE, and extensive cleaning of exam room while  observing appropriate contact time as indicated for disinfecting solutions.

## 2020-02-28 ENCOUNTER — Other Ambulatory Visit: Payer: Self-pay

## 2020-02-28 ENCOUNTER — Encounter: Payer: Self-pay | Admitting: Internal Medicine

## 2020-02-28 ENCOUNTER — Ambulatory Visit (INDEPENDENT_AMBULATORY_CARE_PROVIDER_SITE_OTHER): Payer: Medicare Other | Admitting: Internal Medicine

## 2020-02-28 VITALS — BP 136/84 | HR 82 | Temp 98.3°F | Ht 65.0 in | Wt 260.0 lb

## 2020-02-28 DIAGNOSIS — M533 Sacrococcygeal disorders, not elsewhere classified: Secondary | ICD-10-CM

## 2020-02-28 DIAGNOSIS — G8929 Other chronic pain: Secondary | ICD-10-CM | POA: Diagnosis not present

## 2020-02-28 NOTE — Assessment & Plan Note (Signed)
Chronic Over the past year he has gained significant weight Stressed that he needs to work on decreasing his portions.  He states where he lives the food is very good He is limited with his walking and he is very deconditioned-would benefit from PT-he deferred that for now until seen sports medicine.  He did do physical therapy earlier this year Stressed the consequences of this weight gain and the importance of decreasing his intake

## 2020-02-28 NOTE — Assessment & Plan Note (Signed)
New problem Started about 9 months ago Has left lower back pain that does not radiate.  No leg numbness/tingling/weakness or pain Here today he does not have any pain even after standing for short period of time.  Pain typically comes with standing for certain amount of time and walking Area seems to be located at the left SI joint Deferred physical therapy for now Refer to sports medicine for further evaluation Continue Tylenol as needed

## 2020-02-28 NOTE — Patient Instructions (Addendum)
A referral to sports medicine was ordered.   They will call you to schedule this.     If you want to do PT prior to seeing them let me know.   Work on decreasing your portions - you need to work on weight loss.

## 2020-03-03 ENCOUNTER — Telehealth: Payer: Self-pay | Admitting: Internal Medicine

## 2020-03-03 NOTE — Telephone Encounter (Signed)
Alejandro Brown with [facility] called and was wondering if therapy orders could be faxed to [phone]. They can be attn. To Alejandro Brown. She did not know the frequency.   Phone: [phone]

## 2020-03-04 NOTE — Telephone Encounter (Signed)
Does he want to PT?  He deferred doing physical therapy visit until he saw sports medicine.

## 2020-03-04 NOTE — Telephone Encounter (Signed)
My chart message sent to patient and called and left message for him to return call to clinic.

## 2020-03-17 NOTE — Progress Notes (Signed)
Subjective:    I'm seeing this patient as a consultation for:  Dr. Lawerance Bach. Note will be routed back to referring provider/PCP.  CC: L-sided LBP / SIJ pain  I, Molly Weber, LAT, ATC, am serving as scribe for Dr. Clementeen Graham.  HPI: Pt is a 64 y/o male presenting w/ L-sided LBP x 9 months w/ no certain MOI, possible over-twisting once. He specifically locates his pain to L side low back, SIJ region. He lives at [facility]. Physical therapy is onsite. He has not had physical therapy for his back. He is receiving physical therapy for his knee in the past. He uses a walker to ambulate.  Radiating pain: No LE numbness/tingling: No LE weakness: No Aggravating factors: standing; walking Treatments tried: Tylenol- w/ no relief  Diagnostic testing: L-spine XR- 02/13/20  Past medical history, Surgical history, Family history, Social history, Allergies, and medications have been entered into the medical record, reviewed. Mild MR  Review of Systems: No new headache, visual changes, nausea, vomiting, diarrhea, constipation, dizziness, abdominal pain, skin rash, fevers, chills, night sweats, weight loss, swollen lymph nodes, body aches, joint swelling, muscle aches, chest pain, shortness of breath, mood changes, visual or auditory hallucinations.   Objective:    Vitals:   03/18/20 0934  BP: 128/86  Pulse: 94  SpO2: 96%   General: Well Developed, well nourished, and in no acute distress.  Neuro/Psych: Alert and oriented x3, extra-ocular muscles intact, able to move all 4 extremities, sensation grossly intact. Skin: Warm and dry, no rashes noted.  Respiratory: Not using accessory muscles, speaking in full sentences, trachea midline.  Cardiovascular: Pulses palpable, no extremity edema. Abdomen: Does not appear distended. MSK: L-spine normal-appearing nontender midline. Not particularly tender left paraspinal musculature and SI joint. Decreased lumbar motion. Antalgic  gait.  Lab and Radiology Results EXAM: LUMBAR SPINE - COMPLETE 4+ VIEW  COMPARISON:  05/04/2011  FINDINGS: Spinal degenerative changes with no signs of acute fracture or subluxation T12-L1 degenerative changes are worse than on prior studies.  IMPRESSION: Degenerative changes in the spine. No acute abnormality.   Electronically Signed   By: Donzetta Kohut M.D.   On: 02/13/2019 13:53  I, Clementeen Graham, personally (independently) visualized and performed the interpretation of the images attached in this note.   Impression and Recommendations:    Assessment and Plan: 64 y.o. male with low back pain ongoing for about 9 months. Patient has a significantly abnormal gait because of his knee. I think he has excessive forces through his core stabilizing muscles when he is trying to walk which is causing a lot of his back pain and probably some SI joint pain and dysfunction as well. He is a good candidate for physical therapy. Physical therapy is onsite at his retirement. Plan for referral to PT. Consider aquatic PT if this is not an option or he cannot tolerate regular physical therapy. Recheck in 6 weeks. If not improving either proceed with MRI or proceed with SI joint injection or both.  PDMP not reviewed this encounter. Orders Placed This Encounter  Procedures  . Ambulatory referral to Physical Therapy    Referral Priority:   Routine    Referral Type:   Physical Medicine    Referral Reason:   Specialty Services Required    Requested Specialty:   Physical Therapy    Number of Visits Requested:   1   No orders of the defined types were placed in this encounter.   Discussed warning signs or symptoms.  Please see discharge instructions. Patient expresses understanding.   The above documentation has been reviewed and is accurate and complete Lynne Leader, M.D.

## 2020-03-18 ENCOUNTER — Encounter: Payer: Self-pay | Admitting: Family Medicine

## 2020-03-18 ENCOUNTER — Ambulatory Visit (INDEPENDENT_AMBULATORY_CARE_PROVIDER_SITE_OTHER): Payer: Medicare Other | Admitting: Family Medicine

## 2020-03-18 ENCOUNTER — Other Ambulatory Visit: Payer: Self-pay

## 2020-03-18 VITALS — BP 128/86 | HR 94 | Ht 65.0 in | Wt 265.4 lb

## 2020-03-18 DIAGNOSIS — M545 Low back pain, unspecified: Secondary | ICD-10-CM | POA: Diagnosis not present

## 2020-03-18 DIAGNOSIS — G8929 Other chronic pain: Secondary | ICD-10-CM

## 2020-03-18 DIAGNOSIS — M533 Sacrococcygeal disorders, not elsewhere classified: Secondary | ICD-10-CM | POA: Diagnosis not present

## 2020-03-18 NOTE — Patient Instructions (Addendum)
Thank you for coming in today.  I've referred you to Physical Therapy.  Let us know if you don't hear from them in one week.  Recheck in 6 weeks.   Let me know if this is not working.   Heating pad would help.

## 2020-04-20 ENCOUNTER — Telehealth: Payer: Self-pay | Admitting: Internal Medicine

## 2020-04-20 NOTE — Telephone Encounter (Signed)
[place] w/ [facility] pharmacy called and was wondering if Dr. Quay Burow would be willing to prescribe risperiDONE (RISPERDAL) 0.5 MG tablet  sertraline (ZOLOFT) 50 MG tablet. She said that the refill request can be sent to [facility] [place] Fax: [phone] Phone: [phone]

## 2020-04-21 NOTE — Telephone Encounter (Signed)
Spoke with Alejandro Brown today @ [phone]. She states scripts were orginally written by Dr. Doroteo Bradford. She reached out to [facility] and was told he only does inpatient. She was said patient last got script filled in 12/21 and still had enough of the medication for now. She was going to have the home health nurse reach out to the family to see if he was receiving outside psych services somewhere and call us back if needed.

## 2020-04-21 NOTE — Telephone Encounter (Signed)
Who is prescribing it now - ?psychiatry?  Is he stable on the meds and no longer feels he needs pscyh?

## 2020-04-28 NOTE — Progress Notes (Signed)
I, Alejandro Brown, LAT, ATC, am serving as scribe for Dr. Lynne Brown.  Alejandro Brown is a 65 y.o. male who presents to [name] today for f/u of L-sided LBP / SIJ pain.  He was last seen by Dr. Georgina Snell on 03/18/20 and was referred to PT at his assisted living home.  Since his last visit, pt reports back pain is about the same. Pt has been compliant w/ PT w/ no improvement. Pt c/o back tightness and increased pain w/ standing. No LE numbness/tingling present.  Pain is located in the left lower spine.  No radiating pain weakness or numbness distally.  Diagnostic testing: L-spine XR- 02/13/19  Pertinent review of systems: No fevers or chills  Relevant historical information: Patient lives in an assisted living/retirement community.  Significant for mild developmental delay/mental retardation. Difficult also for obesity  Exam:  BP 138/80 (BP Location: Right Arm, Patient Position: Sitting, Cuff Size: Normal)   Pulse 82   Ht 5\' 5"  (1.651 m)   Wt 267 lb 9.6 oz (121.4 kg)   SpO2 97%   BMI 44.53 kg/m  General: Well Developed, well nourished, and in no acute distress.   MSK: L-spine normal-appearing Nontender midline. Mildly tender palpation left lumbar paraspinal musculature. Not particularly tender left SI joint. Decreased lumbar motion. Lower extremity strength reflexes and sensation are intact distally bilateral lower extremities.    Lab and Radiology Results EXAM: LUMBAR SPINE - COMPLETE 4+ VIEW  COMPARISON:  05/04/2011  FINDINGS: Spinal degenerative changes with no signs of acute fracture or subluxation T12-L1 degenerative changes are worse than on prior studies.  IMPRESSION: Degenerative changes in the spine. No acute abnormality.   Electronically Signed   By: Alejandro Brown M.D.   On: 02/13/2019 13:53  I, Alejandro Brown, personally (independently) visualized and performed the interpretation of the images attached in this  note.      Assessment and Plan: 65 y.o. male with left low back pain.  This is now approaching a chronic issue.  He has had dedicated conservative management including physical therapy at his assisted living facility with little to no improvement.  At this point we will proceed with MRI to further diagnose the cause of pain and for potential facet injection planning.  Discussed the plan with the patient who expresses understanding and agreement.  Return following MRI.  Differential diagnosis also includes left SI joint is a possibility of pain although this is less likely.  If left lower facet joints look pretty normal on MRI or if not better following injections would consider trial of SI joint injection in the future.   PDMP not reviewed this encounter. Orders Placed This Encounter  Procedures  . MR Lumbar Spine Wo Contrast    [facility] [employer] Epic ORDER NO COVID WT:250 HT:5'4 WALKER/NO CLAUS/ NO METAL REMOVED/ NO IMPLANTS/ NO BULLETS OR BB'S/ NO GLUCOSE MONITOR, SPINAL STIMULATOR OR INJECTORS/ NO PREV SX/ NO BRAIN HEART EYE OR EAR SX DP AND PT POA ORDER CHECKED DP 04-29-2020    Standing Status:   Future    Standing Expiration Date:   04/29/2021    Order Specific Question:   What is the patient's sedation requirement?    Answer:   No Sedation    Order Specific Question:   Does the patient have a pacemaker or implanted devices?    Answer:   No    Order Specific Question:   Preferred imaging location?    Answer:   [id number]  [facility] (table limit-550lbs)   No orders of the defined types were placed in this encounter.    Discussed warning signs or symptoms. Please see discharge instructions. Patient expresses understanding.   The above documentation has been reviewed and is accurate and complete Alejandro Brown, M.D.

## 2020-04-29 ENCOUNTER — Ambulatory Visit (INDEPENDENT_AMBULATORY_CARE_PROVIDER_SITE_OTHER): Payer: Medicare Other | Admitting: Family Medicine

## 2020-04-29 ENCOUNTER — Other Ambulatory Visit: Payer: Self-pay

## 2020-04-29 VITALS — BP 138/80 | HR 82 | Ht 65.0 in | Wt 267.6 lb

## 2020-04-29 DIAGNOSIS — M545 Low back pain, unspecified: Secondary | ICD-10-CM | POA: Diagnosis not present

## 2020-04-29 DIAGNOSIS — G8929 Other chronic pain: Secondary | ICD-10-CM

## 2020-04-29 DIAGNOSIS — M533 Sacrococcygeal disorders, not elsewhere classified: Secondary | ICD-10-CM | POA: Diagnosis not present

## 2020-04-29 NOTE — Patient Instructions (Signed)
Thank you for coming in today.   You should hear from MRI scheduling within 1 week. If you do not hear please let me know.    Recheck following the MRI.  

## 2020-05-19 ENCOUNTER — Other Ambulatory Visit: Payer: Self-pay

## 2020-05-19 ENCOUNTER — Ambulatory Visit
Admission: RE | Admit: 2020-05-19 | Discharge: 2020-05-19 | Disposition: A | Discharge status: Home / Self Care | Payer: Medicare Other | Source: Ambulatory Visit | Attending: Family Medicine | Admitting: Family Medicine

## 2020-05-19 DIAGNOSIS — M533 Sacrococcygeal disorders, not elsewhere classified: Secondary | ICD-10-CM

## 2020-05-19 DIAGNOSIS — M545 Low back pain, unspecified: Secondary | ICD-10-CM

## 2020-05-19 DIAGNOSIS — G8929 Other chronic pain: Secondary | ICD-10-CM

## 2020-05-20 NOTE — Progress Notes (Signed)
MRI lumbar spine shows multilevel arthritis changes.  Multiple areas that could be injected.  Recommend you return to clinic to go over these results in full detail so we can discuss potential areas to proceed with injection.

## 2020-06-01 ENCOUNTER — Other Ambulatory Visit: Payer: Self-pay | Admitting: Internal Medicine

## 2020-06-11 ENCOUNTER — Telehealth: Payer: Self-pay | Admitting: Internal Medicine

## 2020-06-11 NOTE — Telephone Encounter (Signed)
Received today. Placed on Dr. Quay Burow for signature. Will fax once she signs off on paper.

## 2020-06-11 NOTE — Telephone Encounter (Signed)
[facility] pharmacy calling, states they are faxing over a physician order sheet. What it is, is a form Dr. Quay Burow will sign to allow them to do all of his refills for the rest of the year and in [place] every year they will send a new one. It looks like a MAR and it will have a list of all the medication they are requesting to fill for the patient. [phone]

## 2020-06-12 NOTE — Telephone Encounter (Signed)
Faxed in today. 

## 2020-06-25 NOTE — Patient Instructions (Addendum)
  Blood work was ordered.     Pneumovax vaccine given today.      Medications changes include :  crestor 5 mg three times a week   Your prescription(s) have been submitted to your pharmacy. Please take as directed and contact our office if you believe you are having problem(s) with the medication(s).     Please followup in 6 months

## 2020-06-25 NOTE — Progress Notes (Signed)
Subjective:    Patient ID: Alejandro Brown, male    DOB: 1955/06/25, 65 y.o.   MRN: [id number]  HPI The patient is here for follow up of their chronic medical problems, including DM, htn, h/o DVT in b/l Legs, leg edema, insomnia.  He is here with his sister-in-law.  He denies any changes in his health and has no concerns.  Medications and allergies reviewed with patient and updated if appropriate.  Patient Active Problem List   Diagnosis Date Noted  . Chronic left SI joint pain 02/28/2020  . Bilateral leg edema 06/10/2019  . Left leg swelling 05/20/2019  . Left groin pain 02/13/2019  . Sleep difficulties 01/29/2019  . DVT (deep venous thrombosis) ([place]) 11/07/2018  . Tachycardia 11/01/2018  . Patellar tendon rupture, left, initial encounter 08/15/2018  . Right shoulder pain 07/09/2018  . Umbilical hernia 88/89/1694  . Enlarged prostate 06/12/2015  . Elevated PSA 06/12/2015  . Ventral hernia 06/12/2015  . HTN (hypertension) 08/31/2010  . Diabetes type 2, controlled ([place]) 08/31/2010  . Morbid obesity ([place]) 08/31/2010  . Mental retardation 08/31/2010  . Obstructive sleep apnea 05/05/2007  . ALLERGIC RHINITIS 05/04/2007    Current Outpatient Medications on File Prior to Visit  Medication Sig Dispense Refill  . Calcium Polycarbophil (FIBER) 625 MG TABS Take 1 tablet (625 mg total) by mouth daily. Taking one daily as needed. 90 tablet 1  . Cholecalciferol (VITAMIN D3) 50 MCG (2000 UT) TABS Take by mouth.    Water engineer Bandages & Supports (MEDICAL COMPRESSION SOCKS) MISC Medium compression 20-30 mm Hg for chronic leg edema 4 each 0  . furosemide (LASIX) 40 MG tablet Take 1 tablet (40 mg total) by mouth daily. 90 tablet 1  . multivitamin (ONE-A-DAY MEN'S) TABS tablet Take 1 tablet by mouth daily.  0  . polyethylene glycol (MIRALAX / GLYCOLAX) 17 g packet Take 17 g by mouth daily.    . ramipril (ALTACE) 5 MG capsule Take 1 capsule (5 mg total) by mouth daily. 90 capsule 1  .  risperiDONE (RISPERDAL) 0.5 MG tablet Take 0.5 mg by mouth at bedtime.    . sertraline (ZOLOFT) 50 MG tablet Take 50 mg by mouth daily.    Alveda Reasons 20 MG TABS tablet TAKE 1 TABLET BY MOUTH ONCE DAILY WITH SUPPER *TAKE WITH FOOD* 30 tablet 11   No current facility-administered medications on file prior to visit.    Past Medical History:  Diagnosis Date  . ALLERGIC RHINITIS   . Allergy   . Arthritis   . Dependent edema    chronic L>R  . Diverticulosis of colon   . GERD (gastroesophageal reflux disease)   . Hypertension   . Mental retardation    mild  . Obesity   . OBSTRUCTIVE SLEEP APNEA    noncompliant with CPAP  . Rupture of left patellar tendon   . Sleep apnea   . Type II or unspecified type diabetes mellitus without mention of complication, not stated as uncontrolled    diet controlled  . Umbilical hernia     Past Surgical History:  Procedure Laterality Date  . HERNIA REPAIR    . NASAL SEPTUM SURGERY    . PATELLAR TENDON REPAIR Left 08/15/2018   Procedure: LEFT PATELLAR TENDON REPAIR;  Surgeon: Leandrew Koyanagi, MD;  Location: [facility];  Service: Orthopedics;  Laterality: Left;  . ROTATOR CUFF REPAIR Left 06/2014   [name]    Social History   Socioeconomic History  .  Marital status: Single    Spouse name: n/a  . Number of children: 0  . Years of education: 12th grade  . Highest education level: Not on file  Occupational History  . 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 Smoking status: Never Smoker  . Smokeless tobacco: Never Used  Vaping Use  . Vaping Use: Never used  Substance and Sexual Activity  . Alcohol use: No    Alcohol/week: 0.0 standard drinks  . Drug use: No  . Sexual activity: Not on file  Other Topics Concern  . 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   Social Determinants of Health   Financial Resource Strain:  Not on file  Food Insecurity: Not on file  Transportation Needs: Not on file  Physical Activity: Not on file  Stress: Not on file  Social Connections: Not on file    Family History  Problem Relation Age of Onset  . Breast cancer Sister   . Breast cancer Other   . Colon cancer Neg Hx   . Esophageal cancer Neg Hx   . Rectal cancer Neg Hx   . Stomach cancer Neg Hx     Review of Systems  Constitutional: Negative for chills and fever.  Respiratory: Positive for shortness of breath and wheezing (little). Negative for cough.   Cardiovascular: Positive for leg swelling. Negative for chest pain and palpitations.  Gastrointestinal: Negative for abdominal pain and blood in stool (no black stool).  Neurological: Negative for light-headedness and headaches.       Objective:   Vitals:   06/26/20 1046  BP: 120/74  Pulse: 80  Temp: 98 F (36.7 C)  SpO2: 96%   BP Readings from Last 3 Encounters:  06/26/20 120/74  04/29/20 138/80  03/18/20 128/86   Wt Readings from Last 3 Encounters:  06/26/20 277 lb (125.6 kg)  04/29/20 267 lb 9.6 oz (121.4 kg)  03/18/20 265 lb 6.4 oz (120.4 kg)   Body mass index is 46.1 kg/m.   Physical Exam    Constitutional: Appears well-developed and well-nourished. No distress.  HENT:  Head: Normocephalic and atraumatic.  Neck: Neck supple. No tracheal deviation present. No thyromegaly present.  No cervical lymphadenopathy Cardiovascular: Normal rate, regular rhythm and normal heart sounds.   No murmur heard. No carotid bruit .  Bilateral lower extremity edema-difficult to measure-wearing bilateral compression socks Pulmonary/Chest: Effort normal and breath sounds normal. No respiratory distress. No has no wheezes. No rales.  Skin: Skin is warm and dry. Not diaphoretic.  Psychiatric: Normal mood and affect. Behavior is normal.    The 10-year ASCVD risk score Mikey Bussing DC Jr., et al., 2013) is: 24.3%   Values used to calculate the score:     Age: 55  years     Sex: Male     Is Non-Hispanic African American: No     Diabetic: Yes     Tobacco smoker: No     Systolic Blood Pressure: 409 mmHg     Is BP treated: Yes     HDL Cholesterol: 46.6 mg/dL     Total Cholesterol: 171 mg/dL   Assessment & Plan:    Pneumovax vaccine today  See Problem List for Assessment and Plan of chronic medical problems.    This visit occurred during the SARS-CoV-2 public health emergency.  Safety protocols were in place, including screening questions prior  to the visit, additional usage of staff PPE, and extensive cleaning of exam room while observing appropriate contact time as indicated for disinfecting solutions.

## 2020-06-26 ENCOUNTER — Ambulatory Visit (INDEPENDENT_AMBULATORY_CARE_PROVIDER_SITE_OTHER): Payer: Medicare Other | Admitting: Internal Medicine

## 2020-06-26 ENCOUNTER — Other Ambulatory Visit: Payer: Self-pay

## 2020-06-26 ENCOUNTER — Encounter: Payer: Self-pay | Admitting: Internal Medicine

## 2020-06-26 VITALS — BP 120/74 | HR 80 | Temp 98.0°F | Ht 65.0 in | Wt 277.0 lb

## 2020-06-26 DIAGNOSIS — G479 Sleep disorder, unspecified: Secondary | ICD-10-CM

## 2020-06-26 DIAGNOSIS — I824Z9 Acute embolism and thrombosis of unspecified deep veins of unspecified distal lower extremity: Secondary | ICD-10-CM | POA: Diagnosis not present

## 2020-06-26 DIAGNOSIS — E119 Type 2 diabetes mellitus without complications: Secondary | ICD-10-CM

## 2020-06-26 DIAGNOSIS — I10 Essential (primary) hypertension: Secondary | ICD-10-CM

## 2020-06-26 DIAGNOSIS — R600 Localized edema: Secondary | ICD-10-CM | POA: Diagnosis not present

## 2020-06-26 DIAGNOSIS — Z23 Encounter for immunization: Secondary | ICD-10-CM | POA: Diagnosis not present

## 2020-06-26 DIAGNOSIS — Z6841 Body Mass Index (BMI) 40.0 and over, adult: Secondary | ICD-10-CM

## 2020-06-26 LAB — COMPREHENSIVE METABOLIC PANEL
ALT: 20 U/L (ref 0–53)
AST: 15 U/L (ref 0–37)
Albumin: 3.8 g/dL (ref 3.5–5.2)
Alkaline Phosphatase: 50 U/L (ref 39–117)
BUN: 11 mg/dL (ref 6–23)
CO2: 32 mEq/L (ref 19–32)
Calcium: 9.2 mg/dL (ref 8.4–10.5)
Chloride: 101 mEq/L (ref 96–112)
Creatinine, Ser: 1.07 mg/dL (ref 0.40–1.50)
GFR: 72.36 mL/min (ref >60.00)
Glucose, Bld: 108 mg/dL — ABNORMAL HIGH (ref 70–99)
Potassium: 4.3 mEq/L (ref 3.5–5.1)
Sodium: 138 mEq/L (ref 135–145)
Total Bilirubin: 0.6 mg/dL (ref 0.2–1.2)
Total Protein: 6.5 g/dL (ref 6.0–8.3)

## 2020-06-26 LAB — LIPID PANEL
Cholesterol: 169 mg/dL (ref 0–200)
HDL: 42.3 mg/dL (ref >39.00)
NonHDL: 127.01
Total CHOL/HDL Ratio: 4
Triglycerides: 243 mg/dL — ABNORMAL HIGH (ref 0.0–149.0)
VLDL: 48.6 mg/dL — ABNORMAL HIGH (ref 0.0–40.0)

## 2020-06-26 LAB — HEMOGLOBIN A1C: Hgb A1c MFr Bld: 6.5 % (ref 4.6–6.5)

## 2020-06-26 LAB — LDL CHOLESTEROL, DIRECT: Direct LDL: 107 mg/dL

## 2020-06-26 MED ORDER — ROSUVASTATIN CALCIUM 5 MG PO TABS: 5.0000 mg | ORAL_TABLET | ORAL | 3 refills | Status: DC

## 2020-06-26 NOTE — Assessment & Plan Note (Signed)
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

## 2020-06-26 NOTE — Assessment & Plan Note (Signed)
Chronic No longer taking Lunesta Sleep has been good-he is on Risperdal nightly

## 2020-06-26 NOTE — Assessment & Plan Note (Signed)
Chronic Controlled Continue compression socks daily Continue Lasix 40 mg daily Stressed weight loss and regular exercise CBC, CMP

## 2020-06-26 NOTE — Assessment & Plan Note (Signed)
Chronic Blood pressure well controlled Continue Avapro 5 mg daily, Lasix 40 mg daily CMP

## 2020-06-26 NOTE — Assessment & Plan Note (Signed)
Chronic He continues to gain weight He is doing some exercise-encouraged more exercise He is eating large portions-states he does not snack much Stressed the importance of decreasing portions Stressed weight loss in the multiple reasons why this would be beneficial for him

## 2020-06-26 NOTE — Addendum Note (Signed)
Addended by: Marcina Millard on: 06/26/2020 04:51 PM   Modules accepted: Orders

## 2020-06-26 NOTE — Assessment & Plan Note (Signed)
Chronic Controlled with diet Check A1c Stressed decrease portions, diabetic diet and weight loss Stressed increasing his exercise

## 2020-07-07 LAB — HM DIABETES EYE EXAM

## 2020-07-09 ENCOUNTER — Encounter: Payer: Self-pay | Admitting: Internal Medicine

## 2020-07-09 NOTE — Progress Notes (Signed)
Outside notes received. Information abstracted. Notes sent to scan.  

## 2020-08-06 ENCOUNTER — Telehealth: Payer: Self-pay

## 2020-08-06 NOTE — Telephone Encounter (Signed)
Left patient a voicemail to call and schedule an AWV with the health Coach

## 2020-09-23 ENCOUNTER — Telehealth: Payer: Self-pay | Admitting: Internal Medicine

## 2020-09-23 DIAGNOSIS — M170 Bilateral primary osteoarthritis of knee: Secondary | ICD-10-CM

## 2020-09-23 NOTE — Telephone Encounter (Signed)
   Angela Nevin from [facility] requesting order for OT eval. Patient needs adaptive equipment and teaching   Please fax order for OT, eval and treat Fax [phone], [employer] Phone [phone]

## 2020-09-23 NOTE — Telephone Encounter (Signed)
ok 

## 2020-09-24 NOTE — Telephone Encounter (Signed)
Ordered - printed in office

## 2020-09-24 NOTE — Telephone Encounter (Signed)
Order faxed today.

## 2020-09-30 ENCOUNTER — Encounter: Payer: Self-pay | Admitting: Internal Medicine

## 2020-09-30 DIAGNOSIS — E119 Type 2 diabetes mellitus without complications: Secondary | ICD-10-CM

## 2020-09-30 DIAGNOSIS — I10 Essential (primary) hypertension: Secondary | ICD-10-CM

## 2020-10-08 ENCOUNTER — Other Ambulatory Visit: Payer: Self-pay

## 2020-10-08 MED ORDER — ROSUVASTATIN CALCIUM 5 MG PO TABS: 5.0000 mg | ORAL_TABLET | ORAL | 3 refills | Status: DC

## 2020-11-05 ENCOUNTER — Encounter: Payer: Self-pay | Admitting: Internal Medicine

## 2020-11-05 DIAGNOSIS — F5101 Primary insomnia: Secondary | ICD-10-CM

## 2020-11-05 DIAGNOSIS — E119 Type 2 diabetes mellitus without complications: Secondary | ICD-10-CM

## 2020-11-05 DIAGNOSIS — F419 Anxiety disorder, unspecified: Secondary | ICD-10-CM

## 2020-11-07 DIAGNOSIS — F419 Anxiety disorder, unspecified: Secondary | ICD-10-CM | POA: Insufficient documentation

## 2020-12-28 ENCOUNTER — Other Ambulatory Visit: Payer: Self-pay

## 2020-12-28 ENCOUNTER — Encounter: Payer: Self-pay | Admitting: Dietician

## 2020-12-28 ENCOUNTER — Encounter: Payer: Medicare Other | Attending: Internal Medicine | Admitting: Dietician

## 2020-12-28 DIAGNOSIS — E119 Type 2 diabetes mellitus without complications: Secondary | ICD-10-CM | POA: Insufficient documentation

## 2020-12-28 DIAGNOSIS — Z6841 Body Mass Index (BMI) 40.0 and over, adult: Secondary | ICD-10-CM | POA: Diagnosis present

## 2020-12-28 NOTE — Progress Notes (Signed)
Medical Nutrition Therapy  Appointment Start time:  0254  Appointment End time:  69 Patient is here today with his sister-in-law. Primary concerns today: His brother wants patient to slow his weight loss to maintain an active semi-independent life.  Referral diagnosis: type 2 diabetes and obesity Preferred learning style: auditory Learning readiness: contemplating   NUTRITION ASSESSMENT   Anthropometrics  66" 282 lbs 12/28/2020   Clinical Medical Hx: Type 2 Diabetes, obesity. OSA (was unable to wear c-pap), intellectual disability Medications: see list to include Lasix Labs: A1C 6.5% 06/2020 Notable Signs/Symptoms: obesity  Lifestyle & Dietary Hx He is in independent living at [facility] assisted living and has unlimited freedom with his food.  He states that they now have a new chef that makes better food.  He can buy candy and soda when he wishes. He fell about 2-3 years ago and drove prior to that time. He used to participate in the [employer] and his favorite thing to play is golf but has not participated recently. His family takes him out for doctor's appointments. Patient states that he eats to overfull frequently.  Supplements: vitamin D Sleep: adequate Stress / self-care: good Current average weekly physical activity: walks with a walker small amounts daily  24-Hr Dietary Recall First Meal: 1 ham biscuits OR bacon and egg croisant OR sausage biscuit OR biscuit and gravy OR bacon, eggs, biscuit  with OCCASIONAL fruit  Snack: none Second Meal: taco pizza OR vege pizza OR [place] pizza OR BBQ OR salad AND fruit OCCASIONAL candy bar Snack:  none Third Meal: vegetables, meat, starch, fruit Snack: none Beverages: water, diet coke, OJ or other juice, occasional sweet tea  Estimated Energy Needs Calories: 2000 Carbohydrate: 125 g Protein: 150 g Fat: 56 g   NUTRITION DIAGNOSIS  NB-1.1 Food and nutrition-related knowledge deficit As related to balance of  carbohydrate, protein, and fat.  As evidenced by diet hx and patient report.   NUTRITION INTERVENTION  Nutrition education (E-1) on the following topics:  Discussed benefits of activity and goal setting Discussed benefits of weight loss (due to A1C and for mobility) Discussed to avoid eating in front of the TV to prevent overeating Discussed stopping eating when full rather than overly uncomfortably full and to eat slowly Discussed moderation with choices  Handouts Provided Include  none  Learning Style & Readiness for Change Teaching method utilized: Visual & Auditory  Demonstrated degree of understanding via: Teach Back  Barriers to learning/adherence to lifestyle change: disability  Goals Established by Pt Walk after each meal to the end of the hall, back to the cafeteria and then to your room.  Avoid eating in front of the TV  Stop eating when you are full rather than overfull.  Ask for a small portion Eat slowly.  Take a bite and put the fork down.  Choose 1 portion of food.  Fruit for dessert. Choose dessert once per week.   MONITORING & EVALUATION Dietary intake, weekly physical activity, and  weight in 1-2 months.  Next Steps  Patient/family is to call for questions.

## 2020-12-28 NOTE — Patient Instructions (Addendum)
Walk after each meal to the end of the hall, back to the cafeteria and then to your room.  Avoid eating in front of the TV  Stop eating when you are full rather than overfull.  Ask for a small portion Eat slowly.  Take a bite and put the fork down.  Choose 1 portion of food.  Fruit for dessert. Choose dessert once per week.

## 2021-01-07 ENCOUNTER — Encounter: Payer: Self-pay | Admitting: Internal Medicine

## 2021-01-07 NOTE — Patient Instructions (Addendum)
  Blood work was ordered.     Medications changes include :   rybelsus 3mg  daily if covered. We can increase this to 7mg  after one month if covered.    Your prescription(s) have been submitted to your pharmacy. Please take as directed and contact our office if you believe you are having problem(s) with the medication(s).    Please followup in 6 months

## 2021-01-07 NOTE — Progress Notes (Signed)
Subjective:    Patient ID: Alejandro Brown, male    DOB: 12-Apr-1955, 65 y.o.   MRN: [id number]  This visit occurred during the SARS-CoV-2 public health emergency.  Safety protocols were in place, including screening questions prior to the visit, additional usage of staff PPE, and extensive cleaning of exam room while observing appropriate contact time as indicated for disinfecting solutions.     HPI The patient is here for follow up of their chronic medical problems, including DM, htn, h/o DVT b/l legs, leg edema, insomnia.  He is here today with his brother and sister-in-law.  OSA - not treated-he did not tolerate CPAP.  He states he feels fine during the day and sleeps well.  He has seen the nutritionist and has a follow-up appointment.  He has not been successful in weight loss efforts.  He does walk daily.  He is not very restraining from eating what he wants for portion control.    Medications and allergies reviewed with patient and updated if appropriate.  Patient Active Problem List   Diagnosis Date Noted   Anxiety 11/07/2020   Chronic left SI joint pain 02/28/2020   Bilateral leg edema 06/10/2019   Sleep difficulties 01/29/2019   History of DVT of lower extremity 11/07/2018   Tachycardia 11/01/2018   Patellar tendon rupture, left, initial encounter 08/15/2018   Right shoulder pain 03/47/4259   Umbilical hernia 56/38/7564   Enlarged prostate 06/12/2015   Elevated PSA 06/12/2015   Ventral hernia 06/12/2015   HTN (hypertension) 08/31/2010   Diabetes type 2, controlled ([place]) 08/31/2010   Morbid obesity with BMI of 45.0-49.9, adult ([place]) 08/31/2010   Mental retardation 08/31/2010   Obstructive sleep apnea 05/05/2007   ALLERGIC RHINITIS 05/04/2007    Current Outpatient Medications on File Prior to Visit  Medication Sig Dispense Refill   Cholecalciferol (VITAMIN D3) 50 MCG (2000 UT) TABS Take by mouth.     Elastic Bandages & Supports (MEDICAL COMPRESSION  SOCKS) MISC Medium compression 20-30 mm Hg for chronic leg edema 4 each 0   eszopiclone (LUNESTA) 1 MG TABS tablet Take 1 mg by mouth at bedtime as needed for sleep. Take immediately before bedtime     furosemide (LASIX) 40 MG tablet Take 1 tablet (40 mg total) by mouth daily. 90 tablet 1   polyethylene glycol (MIRALAX / GLYCOLAX) 17 g packet Take 17 g by mouth daily.     ramipril (ALTACE) 5 MG capsule Take 1 capsule (5 mg total) by mouth daily. 90 capsule 1   risperiDONE (RISPERDAL) 0.5 MG tablet Take 0.5 mg by mouth at bedtime.     rosuvastatin (CRESTOR) 5 MG tablet Take 1 tablet (5 mg total) by mouth 3 (three) times a week. 13 tablet 3   sertraline (ZOLOFT) 50 MG tablet Take 50 mg by mouth daily.     XARELTO 20 MG TABS tablet TAKE 1 TABLET BY MOUTH ONCE DAILY WITH SUPPER *TAKE WITH FOOD* 30 tablet 11   No current facility-administered medications on file prior to visit.    Past Medical History:  Diagnosis Date   ALLERGIC RHINITIS    Allergy    Arthritis    Dependent edema    chronic L>R   Diverticulosis of colon    GERD (gastroesophageal reflux disease)    Hypertension    Mental retardation    mild   Obesity    OBSTRUCTIVE SLEEP APNEA    noncompliant with CPAP   Rupture of left patellar  tendon    Sleep apnea    Type II or unspecified type diabetes mellitus without mention of complication, not stated as uncontrolled    diet controlled   Umbilical hernia     Past Surgical History:  Procedure Laterality Date   HERNIA REPAIR     NASAL SEPTUM SURGERY     PATELLAR TENDON REPAIR Left 08/15/2018   Procedure: LEFT PATELLAR TENDON REPAIR;  Surgeon: Leandrew Koyanagi, MD;  Location: [facility];  Service: Orthopedics;  Laterality: Left;   ROTATOR CUFF REPAIR Left 06/2014   [name]    Social History   Socioeconomic History   Marital status: Single    Spouse name: n/a   Number of children: 0   Years of education: 12th grade   Highest education level: Not on file   Occupational History   Occupation: [employer]    Comment: Custodian/maintenance  Tobacco Use   Smoking status: Never   Smokeless tobacco: Never  Vaping Use   Vaping Use: Never used  Substance and Sexual Activity   Alcohol use: No    Alcohol/week: 0.0 standard drinks   Drug use: No   Sexual activity: Not on file  Other Topics Concern   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   Social Determinants of Health   Financial Resource Strain: Not on file  Food Insecurity: Not on file  Transportation Needs: Not on file  Physical Activity: Not on file  Stress: Not on file  Social Connections: Not on file    Family History  Problem Relation Age of Onset   Breast cancer Sister    Breast cancer Other    Colon cancer Neg Hx    Esophageal cancer Neg Hx    Rectal cancer Neg Hx    Stomach cancer Neg Hx     Review of Systems  Constitutional:  Negative for chills and fever.  Respiratory:  Positive for shortness of breath. Negative for cough and wheezing.   Cardiovascular:  Positive for leg swelling. Negative for chest pain and palpitations.  Gastrointestinal:  Negative for abdominal pain and nausea.       No gerd  Neurological:  Negative for dizziness, light-headedness and headaches.      Objective:   Vitals:   01/08/21 1111  BP: 124/70  Pulse: 100  Temp: 98.6 F (37 C)  SpO2: 96%   BP Readings from Last 3 Encounters:  01/08/21 124/70  06/26/20 120/74  04/29/20 138/80   Wt Readings from Last 3 Encounters:  01/08/21 277 lb 6.4 oz (125.8 kg)  12/28/20 282 lb (127.9 kg)  06/26/20 277 lb (125.6 kg)   Body mass index is 44.77 kg/m.   Physical Exam    Constitutional: Appears well-developed and well-nourished. No distress.  HENT:  Head: Normocephalic and atraumatic.  Neck: Neck supple. No tracheal deviation present. No thyromegaly present.  No cervical lymphadenopathy Cardiovascular:  Normal rate, regular rhythm and normal heart sounds.   No murmur heard. No carotid bruit .  Chronic bilateral lower extremity edema-wearing compression socks-1+ nonpitting edema at ankles bilaterally Pulmonary/Chest: Effort normal and breath sounds normal. No respiratory distress. No has no wheezes. No rales.  Skin: Skin is warm and dry. Not diaphoretic.  Psychiatric: Normal mood and affect. Behavior is normal.      Assessment & Plan:    See Problem List for Assessment and Plan of chronic medical  problems.

## 2021-01-08 ENCOUNTER — Ambulatory Visit (INDEPENDENT_AMBULATORY_CARE_PROVIDER_SITE_OTHER): Payer: Medicare Other | Admitting: Internal Medicine

## 2021-01-08 ENCOUNTER — Other Ambulatory Visit: Payer: Self-pay

## 2021-01-08 VITALS — BP 124/70 | HR 100 | Temp 98.6°F | Ht 66.0 in | Wt 277.4 lb

## 2021-01-08 DIAGNOSIS — R600 Localized edema: Secondary | ICD-10-CM

## 2021-01-08 DIAGNOSIS — Z86718 Personal history of other venous thrombosis and embolism: Secondary | ICD-10-CM | POA: Diagnosis not present

## 2021-01-08 DIAGNOSIS — E119 Type 2 diabetes mellitus without complications: Secondary | ICD-10-CM | POA: Diagnosis not present

## 2021-01-08 DIAGNOSIS — I10 Essential (primary) hypertension: Secondary | ICD-10-CM | POA: Diagnosis not present

## 2021-01-08 DIAGNOSIS — G479 Sleep disorder, unspecified: Secondary | ICD-10-CM

## 2021-01-08 DIAGNOSIS — F419 Anxiety disorder, unspecified: Secondary | ICD-10-CM

## 2021-01-08 DIAGNOSIS — Z6841 Body Mass Index (BMI) 40.0 and over, adult: Secondary | ICD-10-CM

## 2021-01-08 LAB — CBC WITH DIFFERENTIAL/PLATELET
Basophils Absolute: 0.1 10*3/uL (ref 0.0–0.1)
Basophils Relative: 0.9 % (ref 0.0–3.0)
Eosinophils Absolute: 0.2 10*3/uL (ref 0.0–0.7)
Eosinophils Relative: 3 % (ref 0.0–5.0)
HCT: 45.8 % (ref 39.0–52.0)
Hemoglobin: 15.1 g/dL (ref 13.0–17.0)
Lymphocytes Relative: 23.9 % (ref 12.0–46.0)
Lymphs Abs: 2 10*3/uL (ref 0.7–4.0)
MCHC: 33 g/dL (ref 30.0–36.0)
MCV: 91.6 fl (ref 78.0–100.0)
Monocytes Absolute: 0.9 10*3/uL (ref 0.1–1.0)
Monocytes Relative: 10.4 % (ref 3.0–12.0)
Neutro Abs: 5.1 10*3/uL (ref 1.4–7.7)
Neutrophils Relative %: 61.8 % (ref 43.0–77.0)
Platelets: 222 10*3/uL (ref 150.0–400.0)
RBC: 5 Mil/uL (ref 4.22–5.81)
RDW: 12.5 % (ref 11.5–15.5)
WBC: 8.3 10*3/uL (ref 4.0–10.5)

## 2021-01-08 LAB — LIPID PANEL
Cholesterol: 146 mg/dL (ref 0–200)
HDL: 40.8 mg/dL (ref >39.00)
NonHDL: 105.47
Total CHOL/HDL Ratio: 4
Triglycerides: 205 mg/dL — ABNORMAL HIGH (ref 0.0–149.0)
VLDL: 41 mg/dL — ABNORMAL HIGH (ref 0.0–40.0)

## 2021-01-08 LAB — COMPREHENSIVE METABOLIC PANEL
ALT: 22 U/L (ref 0–53)
AST: 19 U/L (ref 0–37)
Albumin: 4.2 g/dL (ref 3.5–5.2)
Alkaline Phosphatase: 50 U/L (ref 39–117)
BUN: 16 mg/dL (ref 6–23)
CO2: 31 mEq/L (ref 19–32)
Calcium: 9.7 mg/dL (ref 8.4–10.5)
Chloride: 101 mEq/L (ref 96–112)
Creatinine, Ser: 1.08 mg/dL (ref 0.40–1.50)
GFR: 71.28 mL/min (ref >60.00)
Glucose, Bld: 91 mg/dL (ref 70–99)
Potassium: 3.8 mEq/L (ref 3.5–5.1)
Sodium: 140 mEq/L (ref 135–145)
Total Bilirubin: 0.8 mg/dL (ref 0.2–1.2)
Total Protein: 7 g/dL (ref 6.0–8.3)

## 2021-01-08 LAB — T4, FREE: Free T4: 0.77 ng/dL (ref 0.60–1.60)

## 2021-01-08 LAB — HEMOGLOBIN A1C: Hgb A1c MFr Bld: 7.1 % — ABNORMAL HIGH (ref 4.6–6.5)

## 2021-01-08 LAB — LDL CHOLESTEROL, DIRECT: Direct LDL: 85 mg/dL

## 2021-01-08 LAB — TSH: TSH: 1.66 u[IU]/mL (ref 0.35–5.50)

## 2021-01-08 MED ORDER — RYBELSUS 3 MG PO TABS: 3.0000 mg | ORAL_TABLET | Freq: Every day | ORAL | 1 refills | Status: DC

## 2021-01-08 NOTE — Assessment & Plan Note (Signed)
Chronic Taking Lunesta 1 mg as needed sleep

## 2021-01-08 NOTE — Assessment & Plan Note (Signed)
Chronic Has been diet controlled and sugars have been well controlled We will check A1c today We will try Rybelsus 3 mg daily to help maintain good sugar control and hopefully help with weight loss-if this is not covered can consider a weekly injectable if covered Discussed possible side effects of Rybelsus

## 2021-01-08 NOTE — Assessment & Plan Note (Signed)
Chronic Following with psychiatry-Dr. Sanjuana Letters Will check TSH, free T4

## 2021-01-08 NOTE — Assessment & Plan Note (Signed)
Chronic Blood pressure well controlled CMP Continue ramipril 5 mg daily 

## 2021-01-08 NOTE — Assessment & Plan Note (Signed)
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

## 2021-01-08 NOTE — Assessment & Plan Note (Signed)
Chronic Fairly controlled Continue bilateral compression socks Continue furosemide 40 mg daily Stressed low-sodium diet, elevating legs when sitting, regular exercise and weight loss

## 2021-01-26 ENCOUNTER — Telehealth: Payer: Self-pay | Admitting: Internal Medicine

## 2021-01-26 NOTE — Telephone Encounter (Signed)
1.Medication Requested: rosuvastatin (CRESTOR) 5 MG tablet  2. Pharmacy (Name, Street, [facility]): [place], [place] - [address]  Phone:  [phone] Fax:  [phone]   3. On Med List: yes  4. Last Visit with PCP: 10.21.22  5. Next visit date with PCP: 04.21.23   Agent: Please be advised that RX refills may take up to 3 business days. We ask that you follow-up with your pharmacy.

## 2021-01-26 NOTE — Telephone Encounter (Signed)
Re-faxed today.

## 2021-01-28 ENCOUNTER — Other Ambulatory Visit: Payer: Self-pay

## 2021-01-28 ENCOUNTER — Encounter: Payer: Medicare Other | Attending: Internal Medicine | Admitting: Dietician

## 2021-01-28 DIAGNOSIS — E119 Type 2 diabetes mellitus without complications: Secondary | ICD-10-CM | POA: Insufficient documentation

## 2021-01-28 NOTE — Patient Instructions (Addendum)
Goals Established by Pt Walk after each meal to the end of the hall, back to the cafeteria and then to your room.   Avoid eating in front of the TV   Stop eating when you are full rather than overfull.   Ask for a small portion Eat slowly.  Take a bite and put the fork down.   Choose 1 portion of food.  Fruit for dessert. Choose dessert once per week. Choose fresh fruit rather than fruit juice most often.   Take the Rybelsus with only 4 oz water and wait 30 minutes to eat. Note any side effects and talk to your sister-in-law about this.

## 2021-01-28 NOTE — Progress Notes (Signed)
Medical Nutrition Therapy  Appointment Start time:  [id number] Appointment End time:  7096 Patient is here today with his sister-in-law. Primary concerns today: His brother wants patient to slow his weight gain to maintain an active semi-independent life.  Referral diagnosis: type 2 diabetes and obesity Preferred learning style: auditory Learning readiness: contemplating   NUTRITION ASSESSMENT  Since last visit, patient states that he has been walking daily, drinking sparkling water rather than sweet beverages, avoiding seconds and choosing dessert only occasionally. He has started the Rybelsus and take this with 8 oz water or more and eats about 20 minutes after.  He is having some urgency with diarrhea at times.  Anthropometrics  66" 281 lbs 01/28/2021 282 lbs 12/28/2020   Clinical Medical Hx: Type 2 Diabetes, obesity. OSA (was unable to wear c-pap), intellectual disability Medications: see list to include Lasix, Rybelsus Labs: A1C 6.5% 06/2020 increased to 7.1% 01/08/2021 Lipids have improved.  Triglycerides 205 01/08/21 reduced from 243 06/26/2020 Notable Signs/Symptoms: obesity  Lifestyle & Dietary Hx He is in independent living at [facility] assisted living and has unlimited freedom with his food.  He states that they now have a new chef that makes better food.  He can buy candy and soda when he wishes. He fell about 2-3 years ago and drove prior to that time. He used to participate in the [employer] and his favorite thing to play is golf but has not participated recently. His family takes him out for doctor's appointments. Patient states that he eats to overfull frequently.  Supplements: vitamin D Sleep: adequate Stress / self-care: good Current average weekly physical activity: walks with a walker small amounts daily  24-Hr Dietary Recall First Meal: 1 ham biscuits OR bacon and egg croisant OR sausage biscuit OR biscuit and gravy OR bacon, eggs, biscuit  with OCCASIONAL  fruit  Snack: special K with berries or plain Cheerios and 1% milk Second Meal: taco pizza OR vege pizza OR [place] pizza OR BBQ OR salad AND fruit OCCASIONAL candy bar Snack:  none Third Meal: vegetables, meat, starch, fruit Snack: none Beverages: water, diet coke, OJ or other juice, occasional sweet tea  Estimated Energy Needs Calories: 2000 Carbohydrate: 125 g Protein: 150 g Fat: 56 g   NUTRITION DIAGNOSIS  NB-1.1 Food and nutrition-related knowledge deficit As related to balance of carbohydrate, protein, and fat.  As evidenced by diet hx and patient report.   NUTRITION INTERVENTION  Nutrition education (E-1) on the following topics:  Encouraged patient with exercise and current behavior change Reviewed stopping eating when full rather than overly uncomfortably full and to eat slowly Reviewed moderation with choices Encouraged fresh fruit rather than juice Discussed that he should take this medication with 4 oz of water, eat 30 minutes after and discuss any side effects such as diarrhea with his sister-in-law or MD.  Handouts Provided Include  none  Learning Style & Readiness for Change Teaching method utilized: Visual & Auditory  Demonstrated degree of understanding via: Teach Back  Barriers to learning/adherence to lifestyle change: disability  Goals Established by Pt Walk after each meal to the end of the hall, back to the cafeteria and then to your room.   Avoid eating in front of the TV   Stop eating when you are full rather than overfull.   Ask for a small portion Eat slowly.  Take a bite and put the fork down.   Choose 1 portion of food.  Fruit for dessert. Choose dessert once per week.  MONITORING & EVALUATION Dietary intake, weekly physical activity, and  weight in 1-2 months.  Next Steps  Patient/family is to call for questions.

## 2021-01-29 ENCOUNTER — Telehealth: Payer: Self-pay | Admitting: Internal Medicine

## 2021-02-15 ENCOUNTER — Encounter: Payer: Self-pay | Admitting: Internal Medicine

## 2021-02-16 MED ORDER — RYBELSUS 7 MG PO TABS: 7.0000 mg | ORAL_TABLET | Freq: Every day | ORAL | 1 refills | Status: DC

## 2021-03-27 ENCOUNTER — Other Ambulatory Visit: Payer: Self-pay | Admitting: Internal Medicine

## 2021-07-08 ENCOUNTER — Encounter: Payer: Self-pay | Admitting: Internal Medicine

## 2021-07-08 NOTE — Patient Instructions (Addendum)
? ? ? ?  Blood work was ordered.   ? ? ?Medications changes include :   change xarelto dose to 10 mg daily with next dose ? ? ?Your prescription(s) have been sent to your pharmacy.  ? ? ? ?Return in about 6 months (around 01/08/2022) for Physical Exam. ? ?

## 2021-07-08 NOTE — Progress Notes (Signed)
? ? ? ? ?Subjective:  ? ? Patient ID: Alejandro Brown, male    DOB: 1956/03/11, 66 y.o.   MRN: [id number] ? ?This visit occurred during the SARS-CoV-2 public health emergency.  Safety protocols were in place, including screening questions prior to the visit, additional usage of staff PPE, and extensive cleaning of exam room while observing appropriate contact time as indicated for disinfecting solutions.   ? ? ?HPI ?Alejandro Brown is here for follow up of his chronic medical problems, including DM, htn, h/o DVT b/l legs, leg edema, insomnia ? ?We started rybelsus at his last visit.  He has not lost weight.  He was placed back on metformin by another doctor-restarted it a week ago or so.  Him and his sister-in-law are not sure of the dose. ? ?He is eating less candy.  He drinks soda once in a while.  He eats dessert nightly.   ? ? ? ?Medications and allergies reviewed with patient and updated if appropriate. ? ?Current Outpatient Medications on File Prior to Visit  ?Medication Sig Dispense Refill  ? Cholecalciferol (VITAMIN D3) 50 MCG (2000 UT) TABS Take by mouth.    ? Elastic Bandages & Supports (MEDICAL COMPRESSION SOCKS) MISC Medium compression 20-30 mm Hg for chronic leg edema 4 each 0  ? furosemide (LASIX) 40 MG tablet Take 1 tablet (40 mg total) by mouth daily. 90 tablet 1  ? polyethylene glycol (MIRALAX / GLYCOLAX) 17 g packet Take 17 g by mouth daily.    ? ramipril (ALTACE) 5 MG capsule Take 1 capsule (5 mg total) by mouth daily. 90 capsule 1  ? risperiDONE (RISPERDAL) 0.5 MG tablet Take 0.5 mg by mouth at bedtime.    ? rosuvastatin (CRESTOR) 5 MG tablet Take 1 tablet (5 mg total) by mouth 3 (three) times a week. 13 tablet 3  ? Semaglutide (RYBELSUS) 7 MG TABS Take 7 mg by mouth daily. 90 tablet 1  ? sertraline (ZOLOFT) 50 MG tablet Take 50 mg by mouth daily.    ? [DISCONTINUED] XARELTO 20 MG TABS tablet TAKE 1 TABLET BY MOUTH ONCE DAILY WITH SUPPER *TAKE WITH FOOD* 30 tablet 11  ? ?No current  facility-administered medications on file prior to visit.  ? ? ? ?Review of Systems  ?Constitutional:  Negative for fever.  ?Respiratory:  Negative for cough, shortness of breath and wheezing.   ?Cardiovascular:  Positive for leg swelling. Negative for chest pain and palpitations.  ?Musculoskeletal:  Positive for arthralgias and back pain.  ?Neurological:  Negative for light-headedness and headaches.  ? ?   ?Objective:  ? ?Vitals:  ? 07/09/21 1027  ?BP: 138/78  ?Pulse: 95  ?Temp: 98.5 ?F (36.9 ?C)  ?SpO2: 95%  ? ?BP Readings from Last 3 Encounters:  ?07/09/21 138/78  ?01/08/21 124/70  ?06/26/20 120/74  ? ?Wt Readings from Last 3 Encounters:  ?07/09/21 283 lb (128.4 kg)  ?01/08/21 277 lb 6.4 oz (125.8 kg)  ?12/28/20 282 lb (127.9 kg)  ? ?Body mass index is 45.68 kg/m?. ? ?  ?Physical Exam ?Constitutional:   ?   General: He is not in acute distress. ?   Appearance: Normal appearance. He is not ill-appearing.  ?HENT:  ?   Head: Normocephalic and atraumatic.  ?Eyes:  ?   Conjunctiva/sclera: Conjunctivae normal.  ?Cardiovascular:  ?   Rate and Rhythm: Normal rate and regular rhythm.  ?   Heart sounds: Normal heart sounds. No murmur heard. ?Pulmonary:  ?   Effort: Pulmonary effort is  normal. No respiratory distress.  ?   Breath sounds: Normal breath sounds. No wheezing or rales.  ?Musculoskeletal:  ?   Right lower leg: Edema (Chronic-wearing compression socks-stable) present.  ?   Left lower leg: Edema (Chronic-wearing compression socks-stable) present.  ?Skin: ?   General: Skin is warm and dry.  ?   Findings: No rash.  ?Neurological:  ?   Mental Status: He is alert. Mental status is at baseline.  ?Psychiatric:     ?   Mood and Affect: Mood normal.  ? ?   ? ?Lab Results  ?Component Value Date  ? WBC 8.3 01/08/2021  ? HGB 15.1 01/08/2021  ? HCT 45.8 01/08/2021  ? PLT 222.0 01/08/2021  ? GLUCOSE 91 01/08/2021  ? CHOL 146 01/08/2021  ? TRIG 205.0 (H) 01/08/2021  ? HDL 40.80 01/08/2021  ? LDLDIRECT 85.0 01/08/2021  ? [place]  95 12/27/2019  ? ALT 22 01/08/2021  ? AST 19 01/08/2021  ? NA 140 01/08/2021  ? K 3.8 01/08/2021  ? CL 101 01/08/2021  ? CREATININE 1.08 01/08/2021  ? BUN 16 01/08/2021  ? CO2 31 01/08/2021  ? TSH 1.66 01/08/2021  ? PSA 6.18 (H) 06/04/2015  ? HGBA1C 7.1 (H) 01/08/2021  ? MICROALBUR <0.7 12/11/2015  ? ? ? ?Assessment & Plan:  ? ? ?See Problem List for Assessment and Plan of chronic medical problems.  ? ? ?

## 2021-07-09 ENCOUNTER — Ambulatory Visit (INDEPENDENT_AMBULATORY_CARE_PROVIDER_SITE_OTHER): Payer: Medicare Other | Admitting: Internal Medicine

## 2021-07-09 VITALS — BP 138/78 | HR 95 | Temp 98.5°F | Ht 66.0 in | Wt 283.0 lb

## 2021-07-09 DIAGNOSIS — E119 Type 2 diabetes mellitus without complications: Secondary | ICD-10-CM | POA: Diagnosis not present

## 2021-07-09 DIAGNOSIS — R600 Localized edema: Secondary | ICD-10-CM

## 2021-07-09 DIAGNOSIS — G4733 Obstructive sleep apnea (adult) (pediatric): Secondary | ICD-10-CM

## 2021-07-09 DIAGNOSIS — G479 Sleep disorder, unspecified: Secondary | ICD-10-CM | POA: Diagnosis not present

## 2021-07-09 DIAGNOSIS — Z86718 Personal history of other venous thrombosis and embolism: Secondary | ICD-10-CM

## 2021-07-09 DIAGNOSIS — I10 Essential (primary) hypertension: Secondary | ICD-10-CM | POA: Diagnosis not present

## 2021-07-09 LAB — COMPREHENSIVE METABOLIC PANEL
ALT: 23 U/L (ref 0–53)
AST: 17 U/L (ref 0–37)
Albumin: 3.9 g/dL (ref 3.5–5.2)
Alkaline Phosphatase: 47 U/L (ref 39–117)
BUN: 17 mg/dL (ref 6–23)
CO2: 30 mEq/L (ref 19–32)
Calcium: 9.2 mg/dL (ref 8.4–10.5)
Chloride: 100 mEq/L (ref 96–112)
Creatinine, Ser: 1.23 mg/dL (ref 0.40–1.50)
GFR: 60.77 mL/min (ref >60.00)
Glucose, Bld: 180 mg/dL — ABNORMAL HIGH (ref 70–99)
Potassium: 4.2 mEq/L (ref 3.5–5.1)
Sodium: 139 mEq/L (ref 135–145)
Total Bilirubin: 0.7 mg/dL (ref 0.2–1.2)
Total Protein: 6.6 g/dL (ref 6.0–8.3)

## 2021-07-09 LAB — MICROALBUMIN / CREATININE URINE RATIO
Creatinine,U: 85.1 mg/dL
Microalb Creat Ratio: 0.8 mg/g (ref 0.0–30.0)
Microalb, Ur: <0.7 mg/dL (ref 0.0–1.9)

## 2021-07-09 LAB — LIPID PANEL
Cholesterol: 135 mg/dL (ref 0–200)
HDL: 40.1 mg/dL (ref >39.00)
LDL Cholesterol: 60 mg/dL (ref 0–99)
NonHDL: 95.36
Total CHOL/HDL Ratio: 3
Triglycerides: 177 mg/dL — ABNORMAL HIGH (ref 0.0–149.0)
VLDL: 35.4 mg/dL (ref 0.0–40.0)

## 2021-07-09 LAB — HEMOGLOBIN A1C: Hgb A1c MFr Bld: 8.2 % — ABNORMAL HIGH (ref 4.6–6.5)

## 2021-07-09 MED ORDER — METFORMIN HCL 500 MG PO TABS: 500.0000 mg | ORAL_TABLET | Freq: Two times a day (BID) | ORAL | 3 refills | Status: DC

## 2021-07-09 MED ORDER — RIVAROXABAN 10 MG PO TABS: 10.0000 mg | ORAL_TABLET | Freq: Every day | ORAL | 3 refills | Status: DC

## 2021-07-09 NOTE — Assessment & Plan Note (Addendum)
Chronic ?Lab Results  ?Component Value Date  ? HGBA1C 7.1 (H) 01/08/2021  ? ?Sugars not ideally controlled at last visit ?Check A1c, urine microalbumin today ?Continue rybelsus 7 mg daily ?Also taking metformin?  Dose-likely metformin 500 mg twice daily, which she can continue ?Stressed regular exercise, diabetic diet ?Will adjust medication as needed ? ?

## 2021-07-09 NOTE — Assessment & Plan Note (Signed)
Chronic fairly controlled ?Continue compression socks ?Continue lasix 40 mg daily ?Low sodium diet, elevating legs, regular exercise, weight loss discussed ?

## 2021-07-09 NOTE — Assessment & Plan Note (Addendum)
Does not tolerate cpap ?Discussed potential consequences of untreated deep apnea ?Mention that he may be a candidate for a different treatment of sleep apnea and to think about if he would like to pursue that ?

## 2021-07-09 NOTE — Assessment & Plan Note (Signed)
Chronic ?H/o b/l DVT on 2 separate occasions ?Continue xarelto but decrease dose to 10 mg daily ?

## 2021-07-09 NOTE — Assessment & Plan Note (Signed)
Chronic ?On risperdal at bedtime ?

## 2021-07-09 NOTE — Assessment & Plan Note (Addendum)
Chronic ?BP controlled ?Continue ramipril 5 mg daily ?cmp ? ?

## 2021-07-13 ENCOUNTER — Encounter: Payer: Self-pay | Admitting: Internal Medicine

## 2021-07-14 ENCOUNTER — Other Ambulatory Visit: Payer: Self-pay | Admitting: Internal Medicine

## 2021-07-14 MED ORDER — METFORMIN HCL 500 MG PO TABS: 500.0000 mg | ORAL_TABLET | Freq: Two times a day (BID) | ORAL | 3 refills | Status: DC

## 2021-07-15 ENCOUNTER — Telehealth: Payer: Self-pay

## 2021-07-15 ENCOUNTER — Other Ambulatory Visit: Payer: Self-pay | Admitting: Internal Medicine

## 2021-07-15 MED ORDER — METFORMIN HCL 500 MG PO TABS: 500.0000 mg | ORAL_TABLET | Freq: Two times a day (BID) | ORAL | 3 refills | Status: DC

## 2021-07-15 NOTE — Telephone Encounter (Signed)
Sharyn Lull is calling to report that she isnt aware of pt ever taking the Rybelsus. She states that she is the one that does his pill box and that was never sent to her.  ? ?Sharyn Lull will call to the Pharmacy to find out when she will receive the Metformin to get him started as soon as possible. ? ?FYI ?

## 2021-07-19 ENCOUNTER — Telehealth: Payer: Self-pay | Admitting: Internal Medicine

## 2021-07-19 DIAGNOSIS — R5381 Other malaise: Secondary | ICD-10-CM

## 2021-07-19 DIAGNOSIS — R2689 Other abnormalities of gait and mobility: Secondary | ICD-10-CM

## 2021-07-19 DIAGNOSIS — M170 Bilateral primary osteoarthritis of knee: Secondary | ICD-10-CM

## 2021-07-19 NOTE — Telephone Encounter (Signed)
Patient had fall at assisted living today.- He has pain in hip on left side.  They would like an order for an Xray  for tomorrow.  Nurse said that he was ridng on his walker instead of using it to walk.  Patient was advised to use his walker in the proper manner.  He has since gone to dinner and seems to be doing well with walker but still has pain in hip ?

## 2021-07-20 NOTE — Telephone Encounter (Signed)
Ok for xray

## 2021-07-20 NOTE — Telephone Encounter (Signed)
Left message for Alejandro Brown with verbal for xray. ?

## 2021-07-21 NOTE — Addendum Note (Signed)
Addended by: Binnie Rail on: 07/21/2021 05:10 PM ? ? Modules accepted: Orders ? ?

## 2021-07-21 NOTE — Telephone Encounter (Signed)
Referral ordered

## 2021-07-21 NOTE — Telephone Encounter (Signed)
Requesting order for PT and OT evaluation with diagnosis code. Sits on walker and scoots around, so has developed "not so good habits."  ? ?Callback #- [phone] ?Fax #- [phone] ?

## 2021-07-22 NOTE — Telephone Encounter (Signed)
Alejandro Brown w/ [facility] checking status of request, informed her referral was placed on 5-3, she is requesting order to be faxed ? ?Fax [phone] ? ?Please advise ?

## 2021-07-22 NOTE — Telephone Encounter (Signed)
Faxed today

## 2021-07-23 ENCOUNTER — Encounter: Payer: Self-pay | Admitting: Internal Medicine

## 2021-08-11 ENCOUNTER — Encounter: Payer: Self-pay | Admitting: Internal Medicine

## 2021-08-11 LAB — HM DIABETES EYE EXAM

## 2021-08-12 ENCOUNTER — Ambulatory Visit (INDEPENDENT_AMBULATORY_CARE_PROVIDER_SITE_OTHER): Payer: Medicare Other | Admitting: Podiatry

## 2021-08-12 DIAGNOSIS — M79674 Pain in right toe(s): Secondary | ICD-10-CM | POA: Diagnosis not present

## 2021-08-12 DIAGNOSIS — B351 Tinea unguium: Secondary | ICD-10-CM | POA: Diagnosis not present

## 2021-08-12 DIAGNOSIS — E119 Type 2 diabetes mellitus without complications: Secondary | ICD-10-CM | POA: Diagnosis not present

## 2021-08-12 DIAGNOSIS — M79675 Pain in left toe(s): Secondary | ICD-10-CM

## 2021-08-12 NOTE — Progress Notes (Signed)
  Subjective:  Patient ID: Alejandro Brown, male    DOB: 08-23-1955,  MRN: [id number]  Chief Complaint  Patient presents with   Nail Problem    Nail trim    66 y.o. male returns for the above complaint.  Patient presents with thickened elongated dystrophic toenails x10.  Painful to touch.  He would like for me to debride that he is not able to do it himself.  He has not seen and was prior to seeing me.  He is a diabetic with last A1c of 8.2  Objective:  There were no vitals filed for this visit. Podiatric Exam: Vascular: dorsalis pedis and posterior tibial pulses are palpable bilateral. Capillary return is immediate. Temperature gradient is WNL. Skin turgor WNL  Sensorium: Normal Semmes Weinstein monofilament test. Normal tactile sensation bilaterally. Nail Exam: Pt has thick disfigured discolored nails with subungual debris noted bilateral entire nail hallux through fifth toenails.  Pain on palpation to the nails. Ulcer Exam: There is no evidence of ulcer or pre-ulcerative changes or infection. Orthopedic Exam: Muscle tone and strength are WNL. No limitations in general ROM. No crepitus or effusions noted.  Skin: No Porokeratosis. No infection or ulcers    Assessment & Plan:   1. Pain due to onychomycosis of toenails of both feet     Patient was evaluated and treated and all questions answered.  Onychomycosis with pain  -Nails palliatively debrided as below. -Educated on self-care  Procedure: Nail Debridement Rationale: pain  Type of Debridement: manual, sharp debridement. Instrumentation: Nail nipper, rotary burr. Number of Nails: 10  Procedures and Treatment: Consent by patient was obtained for treatment procedures. The patient understood the discussion of treatment and procedures well. All questions were answered thoroughly reviewed. Debridement of mycotic and hypertrophic toenails, 1 through 5 bilateral and clearing of subungual debris. No ulceration, no infection  noted.  Return Visit-Office Procedure: Patient instructed to return to the office for a follow up visit 3 months for continued evaluation and treatment.  Boneta Lucks, DPM    No follow-ups on file.

## 2021-09-27 ENCOUNTER — Encounter: Payer: Self-pay | Admitting: Internal Medicine

## 2021-09-27 NOTE — Progress Notes (Signed)
Outside notes received. Information abstracted. Notes sent to scan.  

## 2021-10-03 NOTE — Patient Instructions (Signed)
     Your a1c is 6.8% which is good.      Medications changes include :   none      Return for follow up as scheduled.

## 2021-10-03 NOTE — Progress Notes (Unsigned)
Subjective:    Patient ID: Alejandro Brown, male    DOB: 12/30/1955, 66 y.o.   MRN: [id number]     HPI Alejandro Brown is here for follow up of his chronic medical problems, including DM, htn,  b/l LE edema, h/o dvt  A1c today 6.8%.  Doing some walking for exercise.  Will restart swimming.  He has decreased how much is eating less and has lost some weight.  Medications and allergies reviewed with patient and updated if appropriate.  Current Outpatient Medications on File Prior to Visit  Medication Sig Dispense Refill   Cholecalciferol (VITAMIN D3) 50 MCG (2000 UT) TABS Take by mouth.     Elastic Bandages & Supports (MEDICAL COMPRESSION SOCKS) MISC Medium compression 20-30 mm Hg for chronic leg edema 4 each 0   furosemide (LASIX) 40 MG tablet Take 1 tablet (40 mg total) by mouth daily. 90 tablet 1   metFORMIN (GLUCOPHAGE) 500 MG tablet Take 1 tablet (500 mg total) by mouth 2 (two) times daily with a meal. 180 tablet 3   polyethylene glycol (MIRALAX / GLYCOLAX) 17 g packet Take 17 g by mouth daily.     ramipril (ALTACE) 5 MG capsule Take 1 capsule (5 mg total) by mouth daily. 90 capsule 1   risperiDONE (RISPERDAL) 0.5 MG tablet Take 0.5 mg by mouth at bedtime.     rivaroxaban (XARELTO) 10 MG TABS tablet Take 1 tablet (10 mg total) by mouth daily. 90 tablet 3   rosuvastatin (CRESTOR) 5 MG tablet Take 1 tablet (5 mg total) by mouth 3 (three) times a week. 13 tablet 3   sertraline (ZOLOFT) 50 MG tablet Take 50 mg by mouth daily.     No current facility-administered medications on file prior to visit.     Review of Systems  Constitutional:  Negative for fever.  Respiratory:  Positive for cough (from URI). Negative for shortness of breath and wheezing.   Cardiovascular:  Positive for leg swelling. Negative for chest pain and palpitations.  Gastrointestinal:  Negative for abdominal pain and diarrhea.  Neurological:  Negative for light-headedness and headaches.       Objective:    Vitals:   10/04/21 1443  BP: 126/70  Pulse: 95  Temp: 98.3 F (36.8 C)  SpO2: 97%   BP Readings from Last 3 Encounters:  10/04/21 126/70  07/09/21 138/78  01/08/21 124/70   Wt Readings from Last 3 Encounters:  10/04/21 272 lb (123.4 kg)  07/09/21 283 lb (128.4 kg)  01/08/21 277 lb 6.4 oz (125.8 kg)   Body mass index is 43.9 kg/m.    Physical Exam Constitutional:      General: He is not in acute distress.    Appearance: Normal appearance. He is not ill-appearing.  HENT:     Head: Normocephalic and atraumatic.  Eyes:     Conjunctiva/sclera: Conjunctivae normal.  Cardiovascular:     Rate and Rhythm: Normal rate and regular rhythm.     Heart sounds: Normal heart sounds. No murmur heard. Pulmonary:     Effort: Pulmonary effort is normal. No respiratory distress.     Breath sounds: Normal breath sounds. No wheezing or rales.  Musculoskeletal:     Right lower leg: Edema present.     Left lower leg: Edema (b/l leg edema chronic and stable) present.  Skin:    General: Skin is warm and dry.     Findings: No rash.  Neurological:     Mental Status: He  is alert. Mental status is at baseline.  Psychiatric:        Mood and Affect: Mood normal.        Lab Results  Component Value Date   WBC 8.3 01/08/2021   HGB 15.1 01/08/2021   HCT 45.8 01/08/2021   PLT 222.0 01/08/2021   GLUCOSE 180 (H) 07/09/2021   CHOL 135 07/09/2021   TRIG 177.0 (H) 07/09/2021   HDL 40.10 07/09/2021   LDLDIRECT 85.0 01/08/2021   LDLCALC 60 07/09/2021   ALT 23 07/09/2021   AST 17 07/09/2021   NA 139 07/09/2021   K 4.2 07/09/2021   CL 100 07/09/2021   CREATININE 1.23 07/09/2021   BUN 17 07/09/2021   CO2 30 07/09/2021   TSH 1.66 01/08/2021   PSA 6.18 (H) 06/04/2015   HGBA1C 6.8 (A) 10/04/2021   HGBA1C 6.8 10/04/2021   HGBA1C 6.8 (A) 10/04/2021   HGBA1C 6.8 10/04/2021   MICROALBUR <0.7 07/09/2021     Assessment & Plan:    See Problem List for Assessment and Plan of chronic  medical problems.

## 2021-10-04 ENCOUNTER — Ambulatory Visit (INDEPENDENT_AMBULATORY_CARE_PROVIDER_SITE_OTHER): Payer: Medicare Other | Admitting: Internal Medicine

## 2021-10-04 ENCOUNTER — Encounter: Payer: Self-pay | Admitting: Internal Medicine

## 2021-10-04 VITALS — BP 126/70 | HR 95 | Temp 98.3°F | Ht 66.0 in | Wt 272.0 lb

## 2021-10-04 DIAGNOSIS — G4733 Obstructive sleep apnea (adult) (pediatric): Secondary | ICD-10-CM

## 2021-10-04 DIAGNOSIS — E119 Type 2 diabetes mellitus without complications: Secondary | ICD-10-CM | POA: Diagnosis not present

## 2021-10-04 DIAGNOSIS — I10 Essential (primary) hypertension: Secondary | ICD-10-CM

## 2021-10-04 DIAGNOSIS — R600 Localized edema: Secondary | ICD-10-CM | POA: Diagnosis not present

## 2021-10-04 DIAGNOSIS — Z86718 Personal history of other venous thrombosis and embolism: Secondary | ICD-10-CM | POA: Diagnosis not present

## 2021-10-04 LAB — POCT GLYCOSYLATED HEMOGLOBIN (HGB A1C)
HbA1c POC (<> result, manual entry): 6.8 % (ref 4.0–5.6)
HbA1c, POC (controlled diabetic range): 6.8 % (ref 0.0–7.0)
HbA1c, POC (prediabetic range): 6.8 % — AB (ref 5.7–6.4)
Hemoglobin A1C: 6.8 % — AB (ref 4.0–5.6)

## 2021-10-04 NOTE — Assessment & Plan Note (Signed)
Chronic Check a1c  Lab Results  Component Value Date   HGBA1C 6.8 (A) 10/04/2021   HGBA1C 6.8 10/04/2021   HGBA1C 6.8 (A) 10/04/2021   HGBA1C 6.8 10/04/2021   Sugars well controlled Continue metformin 500 mg bid Stressed regular exercise, diabetic diet Continue to dec portions and work on weight loss

## 2021-10-04 NOTE — Assessment & Plan Note (Signed)
Chronic BP well controlled Continue ramipril 5 mg daily

## 2021-10-04 NOTE — Assessment & Plan Note (Signed)
Chronic H/o of dvt Continue xarelto 10 mg daily for prevention

## 2021-10-04 NOTE — Assessment & Plan Note (Signed)
Chronic Controlled, stable Continue lasix 40 mg daily Continue compression socks, elevation when sitting, regular exercise and weight loss efforts

## 2021-10-22 ENCOUNTER — Telehealth: Payer: Self-pay

## 2021-10-22 DIAGNOSIS — Z789 Other specified health status: Secondary | ICD-10-CM

## 2021-10-22 NOTE — Telephone Encounter (Signed)
I do not think I have ordered OT so he does not need it that is fine.  If I did order it then it was something that [facility] sent to me.

## 2021-10-22 NOTE — Telephone Encounter (Signed)
Alejandro Brown is requesting a CB regarding why pt needs an OT Alejandro Brown.   Alejandro Brown [phone]

## 2021-10-25 NOTE — Telephone Encounter (Signed)
Referral ordered

## 2021-10-26 NOTE — Telephone Encounter (Signed)
Faxed today

## 2021-10-26 NOTE — Telephone Encounter (Signed)
Alejandro Brown has called back with the fax number to send orders for OT.   The Fax\ number is [phone].

## 2021-11-17 ENCOUNTER — Ambulatory Visit: Payer: Federal, State, Local not specified - PPO | Admitting: Podiatry

## 2021-11-25 ENCOUNTER — Ambulatory Visit (INDEPENDENT_AMBULATORY_CARE_PROVIDER_SITE_OTHER): Payer: Medicare Other | Admitting: Podiatry

## 2021-11-25 ENCOUNTER — Encounter: Payer: Self-pay | Admitting: Podiatry

## 2021-11-25 DIAGNOSIS — M79675 Pain in left toe(s): Secondary | ICD-10-CM | POA: Diagnosis not present

## 2021-11-25 DIAGNOSIS — M79674 Pain in right toe(s): Secondary | ICD-10-CM | POA: Diagnosis not present

## 2021-11-25 DIAGNOSIS — E119 Type 2 diabetes mellitus without complications: Secondary | ICD-10-CM | POA: Diagnosis not present

## 2021-11-25 DIAGNOSIS — B351 Tinea unguium: Secondary | ICD-10-CM | POA: Diagnosis not present

## 2021-11-25 NOTE — Progress Notes (Signed)
  Subjective:  Patient ID: Alejandro Brown, male    DOB: 04/26/1955,  MRN: [id number]  Chief Complaint  Patient presents with   Nail Problem    Nail trim    66 y.o. male returns for the above complaint.  Patient presents with thickened elongated dystrophic toenails x10.  Painful to touch.  He would like for me to debride that he is not able to do it himself.  He has not seen and was prior to seeing me.  He is a diabetic with last A1c of 8.2  Objective:  There were no vitals filed for this visit. Podiatric Exam: Vascular: dorsalis pedis and posterior tibial pulses are palpable bilateral. Capillary return is immediate. Temperature gradient is WNL. Skin turgor WNL  Sensorium: Normal Semmes Weinstein monofilament test. Normal tactile sensation bilaterally. Nail Exam: Pt has thick disfigured discolored nails with subungual debris noted bilateral entire nail hallux through fifth toenails.  Pain on palpation to the nails. Ulcer Exam: There is no evidence of ulcer or pre-ulcerative changes or infection. Orthopedic Exam: Muscle tone and strength are WNL. No limitations in general ROM. No crepitus or effusions noted.  Skin: No Porokeratosis. No infection or ulcers    Assessment & Plan:   1. Pain due to onychomycosis of toenails of both feet   2. Controlled type 2 diabetes mellitus without complication, without long-term current use of insulin ([place])      Patient was evaluated and treated and all questions answered.  Onychomycosis with pain  -Nails palliatively debrided as below. -Educated on self-care  Procedure: Nail Debridement Rationale: pain  Type of Debridement: manual, sharp debridement. Instrumentation: Nail nipper, rotary burr. Number of Nails: 10  Procedures and Treatment: Consent by patient was obtained for treatment procedures. The patient understood the discussion of treatment and procedures well. All questions were answered thoroughly reviewed. Debridement of mycotic  and hypertrophic toenails, 1 through 5 bilateral and clearing of subungual debris. No ulceration, no infection noted.  Return Visit-Office Procedure: Patient instructed to return to the office for a follow up visit 3 months for continued evaluation and treatment.  Boneta Lucks, DPM    Return in about 3 months (around 02/24/2022) for Routine Foot Care.

## 2022-01-20 DIAGNOSIS — E785 Hyperlipidemia, unspecified: Secondary | ICD-10-CM | POA: Insufficient documentation

## 2022-01-20 NOTE — Progress Notes (Signed)
Subjective:    Patient ID: Alejandro Brown, male    DOB: 12-04-1955, 66 y.o.   MRN: [id number]     HPI Alejandro Brown is here for a physical exam.   He is here with his sister-in-law.  Overall feels he is doing well.   Having lower back pain.  Has seen sports medicine.  Eating less dessert - has lost some weight    Medications and allergies reviewed with patient and updated if appropriate.  Current Outpatient Medications on File Prior to Visit  Medication Sig Dispense Refill   Cholecalciferol (VITAMIN D3) 50 MCG (2000 UT) TABS Take by mouth.     Elastic Bandages & Supports (MEDICAL COMPRESSION SOCKS) MISC Medium compression 20-30 mm Hg for chronic leg edema 4 each 0   furosemide (LASIX) 40 MG tablet Take 1 tablet (40 mg total) by mouth daily. 90 tablet 1   metFORMIN (GLUCOPHAGE) 500 MG tablet Take 1 tablet (500 mg total) by mouth 2 (two) times daily with a meal. 180 tablet 3   polyethylene glycol (MIRALAX / GLYCOLAX) 17 g packet Take 17 g by mouth daily.     ramipril (ALTACE) 5 MG capsule Take 1 capsule (5 mg total) by mouth daily. 90 capsule 1   risperiDONE (RISPERDAL) 0.5 MG tablet Take 0.5 mg by mouth at bedtime.     rivaroxaban (XARELTO) 10 MG TABS tablet Take 1 tablet (10 mg total) by mouth daily. 90 tablet 3   rosuvastatin (CRESTOR) 5 MG tablet Take 1 tablet (5 mg total) by mouth 3 (three) times a week. 13 tablet 3   sertraline (ZOLOFT) 50 MG tablet Take 50 mg by mouth daily.     No current facility-administered medications on file prior to visit.    Review of Systems  Constitutional:  Negative for chills and fever.  Eyes:  Negative for visual disturbance.  Respiratory:  Positive for shortness of breath. Negative for cough and wheezing.   Cardiovascular:  Positive for leg swelling. Negative for chest pain and palpitations.  Gastrointestinal:  Positive for diarrhea. Negative for abdominal pain, blood in stool, constipation and nausea.       No gerd  Genitourinary:   Negative for difficulty urinating, dysuria and hematuria.  Musculoskeletal:  Positive for arthralgias (left hand) and back pain.  Skin:  Negative for rash.  Neurological:  Negative for light-headedness and headaches.  Psychiatric/Behavioral:  Negative for dysphoric mood. The patient is nervous/anxious.        Objective:   Vitals:   01/21/22 0909  BP: 126/76  Pulse: 92  Temp: 98.5 F (36.9 C)  SpO2: 97%   Filed Weights   01/21/22 0909  Weight: 266 lb 6.4 oz (120.8 kg)   Body mass index is 43 kg/m.  BP Readings from Last 3 Encounters:  01/21/22 126/76  10/04/21 126/70  07/09/21 138/78    Wt Readings from Last 3 Encounters:  01/21/22 266 lb 6.4 oz (120.8 kg)  10/04/21 272 lb (123.4 kg)  07/09/21 283 lb (128.4 kg)      Physical Exam Constitutional: He appears well-developed and well-nourished. No distress.  HENT:  Head: Normocephalic and atraumatic.  Right Ear: External ear normal.  Left Ear: External ear normal.  Mouth/Throat: Oropharynx is clear and moist.  Normal ear canals and TM b/l  Eyes: Conjunctivae and EOM are normal.  Neck: Neck supple. No tracheal deviation present. No thyromegaly present.  No carotid bruit  Cardiovascular: Normal rate, regular rhythm, normal heart sounds and intact distal  pulses.   No murmur heard. Pulmonary/Chest: Effort normal and breath sounds normal. No respiratory distress. He has no wheezes. He has no rales.  Abdominal: Soft. He exhibits no distension. There is no tenderness.  Genitourinary: deferred  Musculoskeletal: He exhibits no edema.  Lymphadenopathy:   He has no cervical adenopathy.  Skin: Skin is warm and dry. He is not diaphoretic.  Psychiatric: He has a normal mood and affect. His behavior is normal.         Assessment & Plan:   Physical exam: Screening blood work  ordered Exercise   some walking-encouraged increased exercise Weight  encouraged weight loss Substance abuse   none   Reviewed recommended  immunizations.   Health Maintenance  Topic Date Due   Medicare Annual Wellness (AWV)  Never done   COVID-19 Vaccine (4 - Pfizer series) 02/06/2022 (Originally 02/21/2020)   Zoster Vaccines- Shingrix (1 of 2) 04/23/2022 (Originally 01/24/2004)   TETANUS/TDAP  01/22/2023 (Originally 08/08/2019)   Diabetic kidney evaluation - Urine ACR  07/10/2022   HEMOGLOBIN A1C  07/22/2022   OPHTHALMOLOGY EXAM  08/12/2022   FOOT EXAM  11/26/2022   COLONOSCOPY (Pts 45-86yr Insurance coverage will need to be confirmed)  01/16/2023   Diabetic kidney evaluation - GFR measurement  01/22/2023   Pneumonia Vaccine 68 Years old  Completed   INFLUENZA VACCINE  Completed   Hepatitis C Screening  Completed   HPV VACCINES  Aged Out     See Problem List for Assessment and Plan of chronic medical problems.

## 2022-01-20 NOTE — Patient Instructions (Addendum)
Blood work was ordered.   The lab is on the first floor.    Medications changes include :   None     Return in about 6 months (around 07/22/2022) for follow up.    Health Maintenance, Male Adopting a healthy lifestyle and getting preventive care are important in promoting health and wellness. Ask your health care provider about: The right schedule for you to have regular tests and exams. Things you can do on your own to prevent diseases and keep yourself healthy. What should I know about diet, weight, and exercise? Eat a healthy diet  Eat a diet that includes plenty of vegetables, fruits, low-fat dairy products, and lean protein. Do not eat a lot of foods that are high in solid fats, added sugars, or sodium. Maintain a healthy weight Body mass index (BMI) is a measurement that can be used to identify possible weight problems. It estimates body fat based on height and weight. Your health care provider can help determine your BMI and help you achieve or maintain a healthy weight. Get regular exercise Get regular exercise. This is one of the most important things you can do for your health. Most adults should: Exercise for at least 150 minutes each week. The exercise should increase your heart rate and make you sweat (moderate-intensity exercise). Do strengthening exercises at least twice a week. This is in addition to the moderate-intensity exercise. Spend less time sitting. Even light physical activity can be beneficial. Watch cholesterol and blood lipids Have your blood tested for lipids and cholesterol at 68 years of age, then have this test every 5 years. You may need to have your cholesterol levels checked more often if: Your lipid or cholesterol levels are high. You are older than 68 years of age. You are at high risk for heart disease. What should I know about cancer screening? Many types of cancers can be detected early and may often be prevented. Depending on your  health history and family history, you may need to have cancer screening at various ages. This may include screening for: Colorectal cancer. Prostate cancer. Skin cancer. Lung cancer. What should I know about heart disease, diabetes, and high blood pressure? Blood pressure and heart disease High blood pressure causes heart disease and increases the risk of stroke. This is more likely to develop in people who have high blood pressure readings or are overweight. Talk with your health care provider about your target blood pressure readings. Have your blood pressure checked: Every 3-5 years if you are 48-21 years of age. Every year if you are 9 years old or older. If you are between the ages of 53 and 52 and are a current or former smoker, ask your health care provider if you should have a one-time screening for abdominal aortic aneurysm (AAA). Diabetes Have regular diabetes screenings. This checks your fasting blood sugar level. Have the screening done: Once every three years after age 17 if you are at a normal weight and have a low risk for diabetes. More often and at a younger age if you are overweight or have a high risk for diabetes. What should I know about preventing infection? Hepatitis B If you have a higher risk for hepatitis B, you should be screened for this virus. Talk with your health care provider to find out if you are at risk for hepatitis B infection. Hepatitis C Blood testing is recommended for: Everyone born from [birth year] through 1967. Anyone with known  risk factors for hepatitis C. Sexually transmitted infections (STIs) You should be screened each year for STIs, including gonorrhea and chlamydia, if: You are sexually active and are younger than 68 years of age. You are older than 68 years of age and your health care provider tells you that you are at risk for this type of infection. Your sexual activity has changed since you were last screened, and you are at increased risk  for chlamydia or gonorrhea. Ask your health care provider if you are at risk. Ask your health care provider about whether you are at high risk for HIV. Your health care provider may recommend a prescription medicine to help prevent HIV infection. If you choose to take medicine to prevent HIV, you should first get tested for HIV. You should then be tested every 3 months for as long as you are taking the medicine. Follow these instructions at home: Alcohol use Do not drink alcohol if your health care provider tells you not to drink. If you drink alcohol: Limit how much you have to 0-2 drinks a day. Know how much alcohol is in your drink. In the [place] one drink equals one 12 oz bottle of beer (355 mL), one 5 oz glass of wine (148 mL), or one 1 oz glass of hard liquor (44 mL). Lifestyle Do not use any products that contain nicotine or tobacco. These products include cigarettes, chewing tobacco, and vaping devices, such as e-cigarettes. If you need help quitting, ask your health care provider. Do not use street drugs. Do not share needles. Ask your health care provider for help if you need support or information about quitting drugs. General instructions Schedule regular health, dental, and eye exams. Stay current with your vaccines. Tell your health care provider if: You often feel depressed. You have ever been abused or do not feel safe at home. Summary Adopting a healthy lifestyle and getting preventive care are important in promoting health and wellness. Follow your health care provider's instructions about healthy diet, exercising, and getting tested or screened for diseases. Follow your health care provider's instructions on monitoring your cholesterol and blood pressure. This information is not intended to replace advice given to you by your health care provider. Make sure you discuss any questions you have with your health care provider. Document Revised: 07/27/2020 Document Reviewed:  07/27/2020 Elsevier Patient Education  [place].

## 2022-01-21 ENCOUNTER — Encounter: Payer: Self-pay | Admitting: Internal Medicine

## 2022-01-21 ENCOUNTER — Ambulatory Visit (INDEPENDENT_AMBULATORY_CARE_PROVIDER_SITE_OTHER): Payer: Medicare Other | Admitting: Internal Medicine

## 2022-01-21 VITALS — BP 126/76 | HR 92 | Temp 98.5°F | Ht 66.0 in | Wt 266.4 lb

## 2022-01-21 DIAGNOSIS — E782 Mixed hyperlipidemia: Secondary | ICD-10-CM

## 2022-01-21 DIAGNOSIS — R600 Localized edema: Secondary | ICD-10-CM | POA: Diagnosis not present

## 2022-01-21 DIAGNOSIS — I10 Essential (primary) hypertension: Secondary | ICD-10-CM | POA: Diagnosis not present

## 2022-01-21 DIAGNOSIS — Z86718 Personal history of other venous thrombosis and embolism: Secondary | ICD-10-CM | POA: Diagnosis not present

## 2022-01-21 DIAGNOSIS — E119 Type 2 diabetes mellitus without complications: Secondary | ICD-10-CM | POA: Diagnosis not present

## 2022-01-21 DIAGNOSIS — Z0000 Encounter for general adult medical examination without abnormal findings: Secondary | ICD-10-CM | POA: Diagnosis not present

## 2022-01-21 LAB — COMPREHENSIVE METABOLIC PANEL
ALT: 26 U/L (ref 0–53)
AST: 16 U/L (ref 0–37)
Albumin: 4 g/dL (ref 3.5–5.2)
Alkaline Phosphatase: 44 U/L (ref 39–117)
BUN: 14 mg/dL (ref 6–23)
CO2: 25 mEq/L (ref 19–32)
Calcium: 9.4 mg/dL (ref 8.4–10.5)
Chloride: 102 mEq/L (ref 96–112)
Creatinine, Ser: 1.05 mg/dL (ref 0.40–1.50)
GFR: 73.2 mL/min (ref >60.00)
Glucose, Bld: 169 mg/dL — ABNORMAL HIGH (ref 70–99)
Potassium: 4.2 mEq/L (ref 3.5–5.1)
Sodium: 139 mEq/L (ref 135–145)
Total Bilirubin: 0.6 mg/dL (ref 0.2–1.2)
Total Protein: 6.6 g/dL (ref 6.0–8.3)

## 2022-01-21 LAB — LIPID PANEL
Cholesterol: 107 mg/dL (ref 0–200)
HDL: 40.8 mg/dL (ref >39.00)
LDL Cholesterol: 40 mg/dL (ref 0–99)
NonHDL: 65.75
Total CHOL/HDL Ratio: 3
Triglycerides: 129 mg/dL (ref 0.0–149.0)
VLDL: 25.8 mg/dL (ref 0.0–40.0)

## 2022-01-21 LAB — CBC WITH DIFFERENTIAL/PLATELET
Basophils Absolute: 0.1 10*3/uL (ref 0.0–0.1)
Basophils Relative: 1.3 % (ref 0.0–3.0)
Eosinophils Absolute: 0.3 10*3/uL (ref 0.0–0.7)
Eosinophils Relative: 4.2 % (ref 0.0–5.0)
HCT: 43.8 % (ref 39.0–52.0)
Hemoglobin: 14.7 g/dL (ref 13.0–17.0)
Lymphocytes Relative: 24.9 % (ref 12.0–46.0)
Lymphs Abs: 1.9 10*3/uL (ref 0.7–4.0)
MCHC: 33.5 g/dL (ref 30.0–36.0)
MCV: 91.9 fl (ref 78.0–100.0)
Monocytes Absolute: 0.4 10*3/uL (ref 0.1–1.0)
Monocytes Relative: 5.8 % (ref 3.0–12.0)
Neutro Abs: 4.9 10*3/uL (ref 1.4–7.7)
Neutrophils Relative %: 63.8 % (ref 43.0–77.0)
Platelets: 216 10*3/uL (ref 150.0–400.0)
RBC: 4.77 Mil/uL (ref 4.22–5.81)
RDW: 12.4 % (ref 11.5–15.5)
WBC: 7.6 10*3/uL (ref 4.0–10.5)

## 2022-01-21 LAB — HEMOGLOBIN A1C: Hgb A1c MFr Bld: 6.8 % — ABNORMAL HIGH (ref 4.6–6.5)

## 2022-01-21 NOTE — Assessment & Plan Note (Signed)
Chronic Regular exercise and healthy diet encouraged Check lipid panel, CMP Continue Crestor 5 mg 3 times weekly

## 2022-01-21 NOTE — Assessment & Plan Note (Signed)
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

## 2022-01-21 NOTE — Assessment & Plan Note (Signed)
Chronic Blood pressure well controlled CMP Continue ramipril 5 mg daily

## 2022-01-21 NOTE — Assessment & Plan Note (Addendum)
Chronic   Lab Results  Component Value Date   HGBA1C 6.8 (A) 10/04/2021   HGBA1C 6.8 10/04/2021   HGBA1C 6.8 (A) 10/04/2021   HGBA1C 6.8 10/04/2021   Sugars controlled Has cut down on the desserts Check A1c Continue metformin 500 mg twice daily Stressed regular exercise, diabetic diet Encouraged weight loss

## 2022-01-21 NOTE — Assessment & Plan Note (Signed)
Chronic Stable Continue Lasix 40 mg daily Continue compression socks daily Elevate when sitting, low-sodium diet encouraged Stressed regular exercise-walking Encouraged weight loss

## 2022-01-27 ENCOUNTER — Telehealth: Payer: Self-pay | Admitting: Internal Medicine

## 2022-01-27 DIAGNOSIS — M549 Dorsalgia, unspecified: Secondary | ICD-10-CM

## 2022-01-27 NOTE — Telephone Encounter (Signed)
Ordered  please fax

## 2022-01-27 NOTE — Telephone Encounter (Signed)
[facility], Personal assistant, Tamika, called to request a Referral for PT FOR BACK PAIN. FAX [phone] [facility] [phone]  Patient phone [phone]

## 2022-01-28 NOTE — Telephone Encounter (Signed)
Order faxed.

## 2022-02-22 ENCOUNTER — Ambulatory Visit (INDEPENDENT_AMBULATORY_CARE_PROVIDER_SITE_OTHER): Payer: Medicare Other | Admitting: Podiatry

## 2022-02-22 ENCOUNTER — Encounter: Payer: Self-pay | Admitting: Podiatry

## 2022-02-22 VITALS — BP 142/71

## 2022-02-22 DIAGNOSIS — B351 Tinea unguium: Secondary | ICD-10-CM | POA: Diagnosis not present

## 2022-02-22 DIAGNOSIS — M79674 Pain in right toe(s): Secondary | ICD-10-CM | POA: Diagnosis not present

## 2022-02-22 DIAGNOSIS — E119 Type 2 diabetes mellitus without complications: Secondary | ICD-10-CM

## 2022-02-22 DIAGNOSIS — M79675 Pain in left toe(s): Secondary | ICD-10-CM

## 2022-02-22 NOTE — Progress Notes (Signed)
This patient returns to my office for at risk foot care.  This patient requires this care by a professional since this patient will be at risk due to having diabetes.  This patient is unable to cut nails himself since the patient cannot reach his nails.These nails are painful walking and wearing shoes.  This patient presents for at risk foot care today.  General Appearance  Alert, conversant and in no acute stress.  Vascular  Dorsalis pedis and posterior tibial  pulses are not palpable due to swelling. palpable  bilaterally.  Capillary return is within normal limits  bilaterally. Temperature is within normal limits  bilaterally.  Neurologic  Senn-Weinstein monofilament wire test within normal limits  bilaterally. Muscle power within normal limits bilaterally.  Nails Thick disfigured discolored nails with subungual debris  from hallux to fifth toes bilaterally. No evidence of bacterial infection or drainage bilaterally.  Orthopedic  No limitations of motion  feet .  No crepitus or effusions noted.  No bony pathology or digital deformities noted.  Skin  normotropic skin with no porokeratosis noted bilaterally.  No signs of infections or ulcers noted.     Onychomycosis  Pain in right toes  Pain in left toes  Consent was obtained for treatment procedures.   Mechanical debridement of nails 1-5  bilaterally performed with a nail nipper.  Filed with dremel without incident.    Return office visit    3 months                  Told patient to return for periodic foot care and evaluation due to potential at risk complications.   Gardiner Barefoot DPM

## 2022-02-23 ENCOUNTER — Ambulatory Visit: Payer: Federal, State, Local not specified - PPO | Admitting: Podiatry

## 2022-03-29 ENCOUNTER — Telehealth: Payer: Self-pay | Admitting: Internal Medicine

## 2022-03-29 ENCOUNTER — Other Ambulatory Visit: Payer: Self-pay | Admitting: Internal Medicine

## 2022-03-29 MED ORDER — METFORMIN HCL 500 MG PO TABS: 500.0000 mg | ORAL_TABLET | Freq: Two times a day (BID) | ORAL | 2 refills | Status: DC

## 2022-03-29 MED ORDER — FUROSEMIDE 40 MG PO TABS: 40.0000 mg | ORAL_TABLET | Freq: Every day | ORAL | 3 refills | Status: DC

## 2022-03-29 MED ORDER — ROSUVASTATIN CALCIUM 5 MG PO TABS: ORAL_TABLET | ORAL | 4 refills | Status: DC

## 2022-03-29 MED ORDER — RIVAROXABAN 20 MG PO TABS: ORAL_TABLET | ORAL | 3 refills | Status: DC

## 2022-03-29 MED ORDER — VITAMIN D3 50 MCG (2000 UT) PO TABS: 1.0000 | ORAL_TABLET | Freq: Every day | ORAL | 3 refills | Status: DC

## 2022-03-29 NOTE — Telephone Encounter (Signed)
Caller & Relationship to patient: Pharmacy  Call back number: [phone]  Date of last office visit: 11.3.23  Date of next office visit: 5.3.24  Medication(s) to be refilled:  Cholecalciferol (VITAMIN D3) 50 MCG (2000 UT) TABS   furosemide (LASIX) 40 MG tablet   metFORMIN (GLUCOPHAGE) 500 MG tablet   ramipril (ALTACE) 5 MG capsule   risperiDONE (RISPERDAL) 0.5 MG tablet   rivaroxaban (XARELTO) 10 MG TABS tablet   rosuvastatin (CRESTOR) 5 MG tablet   sertraline (ZOLOFT) 50 MG tablet    Preferred Pharmacy:   [facility]   Phone: [phone]  Fax: [phone]

## 2022-03-29 NOTE — Telephone Encounter (Signed)
Refills sent in

## 2022-03-31 ENCOUNTER — Telehealth: Payer: Self-pay

## 2022-03-31 DIAGNOSIS — F419 Anxiety disorder, unspecified: Secondary | ICD-10-CM

## 2022-03-31 MED ORDER — SERTRALINE HCL 50 MG PO TABS: 50.0000 mg | ORAL_TABLET | Freq: Every day | ORAL | 0 refills | Status: DC

## 2022-03-31 MED ORDER — RISPERIDONE 0.5 MG PO TABS: 0.5000 mg | ORAL_TABLET | Freq: Every day | ORAL | 0 refills | Status: DC

## 2022-03-31 NOTE — Telephone Encounter (Signed)
I can refill the medication short-term-90-day supply sent to pharmacy.  Referral ordered for psychiatry.  He can also find his own psychiatrist as a referral is not necessary if he wishes.

## 2022-03-31 NOTE — Addendum Note (Signed)
Addended by: Binnie Rail on: 03/31/2022 12:16 PM   Modules accepted: Orders

## 2022-03-31 NOTE — Telephone Encounter (Signed)
Raymon Mutton w/ [facility] called in regards to getting rx refills for risperiDONE (RISPERDAL) 0.5 MG tablet and sertraline (ZOLOFT) 50 MG tablet  . Pt does not see a psych and has not seen one in over a year. I was able to inform Raymon Mutton that pt may need to be seen by an outpatient psych doctor as the last one seen was inpatient. She has asked that a referral to an outpatient psych be sent so that pt can follow through with care for the above medications. Pt is currently getting low in meds and needs a refill sent in until he is able to establish care with a psych.

## 2022-03-31 NOTE — Telephone Encounter (Signed)
Spoke with Lyndee Leo (patient's caregiver).  She will follow up with [facility] as she states patient already has a psychiatrist and no additional refills needed to be sent in.  She will follow up with my-chart message.

## 2022-04-04 ENCOUNTER — Encounter: Payer: Self-pay | Admitting: Internal Medicine

## 2022-05-26 ENCOUNTER — Other Ambulatory Visit: Payer: Self-pay

## 2022-05-26 ENCOUNTER — Telehealth: Payer: Self-pay | Admitting: Internal Medicine

## 2022-05-26 DIAGNOSIS — Z86718 Personal history of other venous thrombosis and embolism: Secondary | ICD-10-CM

## 2022-05-26 NOTE — Telephone Encounter (Signed)
[facility] called requesting a order for this patient to receive a Doppler Ultrasound of the left leg. Best callback number is [phone].

## 2022-05-26 NOTE — Telephone Encounter (Signed)
Called number back today to obtain more info regarding Doppler request. Left message for Sharyn Lull to return call to clinic.  If she calls back please see where order needs to be faxed (location) along with fax number.

## 2022-05-26 NOTE — Telephone Encounter (Signed)
Spoke with [place] [facility] today and order faxed.  They will try to get patient scheduled for today.  She has been made aware that Dr. Quay Burow is not in the office again until Monday and if test if positive another provider will have to address.

## 2022-05-26 NOTE — Telephone Encounter (Signed)
[facility] called back providing their fax number which is [phone]. She also stated that she needs the order to just state that the patient needs a, Let Doppler Scan of the lower extremity. Best callback number is [phone].

## 2022-05-26 NOTE — Telephone Encounter (Signed)
Order faxed.

## 2022-05-27 ENCOUNTER — Encounter: Payer: Self-pay | Admitting: Podiatry

## 2022-05-27 ENCOUNTER — Ambulatory Visit (INDEPENDENT_AMBULATORY_CARE_PROVIDER_SITE_OTHER): Payer: Medicare Other | Admitting: Podiatry

## 2022-05-27 DIAGNOSIS — B351 Tinea unguium: Secondary | ICD-10-CM

## 2022-05-27 DIAGNOSIS — M79675 Pain in left toe(s): Secondary | ICD-10-CM

## 2022-05-27 DIAGNOSIS — E119 Type 2 diabetes mellitus without complications: Secondary | ICD-10-CM

## 2022-05-27 DIAGNOSIS — M79674 Pain in right toe(s): Secondary | ICD-10-CM | POA: Diagnosis not present

## 2022-05-27 NOTE — Telephone Encounter (Signed)
Order has been re faxed, confirmation received

## 2022-05-27 NOTE — Progress Notes (Signed)
This patient returns to my office for at risk foot care.  This patient requires this care by a professional since this patient will be at risk due to having diabetes.  This patient is unable to cut nails himself since the patient cannot reach his nails.These nails are painful walking and wearing shoes.  This patient presents for at risk foot care today.  General Appearance  Alert, conversant and in no acute stress.  Vascular  Dorsalis pedis and posterior tibial  pulses are not palpable due to swelling. palpable  bilaterally.  Capillary return is within normal limits  bilaterally. Temperature is within normal limits  bilaterally.  Neurologic  Senn-Weinstein monofilament wire test within normal limits  bilaterally. Muscle power within normal limits bilaterally.  Nails Thick disfigured discolored nails with subungual debris  from hallux to fifth toes bilaterally. No evidence of bacterial infection or drainage bilaterally.  Orthopedic  No limitations of motion  feet .  No crepitus or effusions noted.  No bony pathology or digital deformities noted.  Skin  normotropic skin with no porokeratosis noted bilaterally.  No signs of infections or ulcers noted.     Onychomycosis  Pain in right toes  Pain in left toes  Consent was obtained for treatment procedures.   Mechanical debridement of nails 1-5  bilaterally performed with a nail nipper.  Filed with dremel without incident.    Return office visit    3 months                  Told patient to return for periodic foot care and evaluation due to potential at risk complications.   Kiante Petrovich DPM   

## 2022-07-01 ENCOUNTER — Encounter: Payer: Self-pay | Admitting: Internal Medicine

## 2022-07-07 ENCOUNTER — Other Ambulatory Visit: Payer: Self-pay | Admitting: Internal Medicine

## 2022-07-21 ENCOUNTER — Encounter: Payer: Self-pay | Admitting: Internal Medicine

## 2022-07-21 NOTE — Progress Notes (Signed)
Subjective:    Patient ID: Alejandro Brown, male    DOB: 1955-06-27, 67 y.o.   MRN: [id number]     HPI Alejandro Brown is here for follow up of his chronic medical problems.  Eating less desserts. He is eating lighter.   He walks a little.    Medications and allergies reviewed with patient and updated if appropriate.  Current Outpatient Medications on File Prior to Visit  Medication Sig Dispense Refill   Cholecalciferol (VITAMIN D3) 50 MCG (2000 UT) TABS Take 1 tablet (50 mcg total) by mouth daily. 30 tablet 3   Elastic Bandages & Supports (MEDICAL COMPRESSION SOCKS) MISC Medium compression 20-30 mm Hg for chronic leg edema 4 each 0   furosemide (LASIX) 40 MG tablet Take 1 tablet (40 mg total) by mouth daily. 30 tablet 3   metFORMIN (GLUCOPHAGE) 500 MG tablet Take 1 tablet (500 mg total) by mouth 2 (two) times daily with a meal. 180 tablet 2   polyethylene glycol (MIRALAX / GLYCOLAX) 17 g packet Take 17 g by mouth daily.     ramipril (ALTACE) 5 MG capsule TAKE 1 CAPSULE BY MOUTH ONCE DAILY 7 capsule 10   risperiDONE (RISPERDAL) 0.5 MG tablet TAKE 1 TABLET BY MOUTH EVERY NIGHT AT BEDTIME 90 tablet 1   rivaroxaban (XARELTO) 10 MG TABS tablet Take 1 tablet (10 mg total) by mouth daily. 90 tablet 3   rosuvastatin (CRESTOR) 5 MG tablet TAKE 1 TABLET BY MOUTH THREE TIMES A WEEK ON MON, WEDS, FRI 12 tablet 4   sertraline (ZOLOFT) 50 MG tablet TAKE 1 TABLET BY MOUTH ONCE DAILY 90 tablet 1   No current facility-administered medications on file prior to visit.     Review of Systems  Constitutional:  Negative for fever.  Respiratory:  Negative for cough, shortness of breath and wheezing.   Cardiovascular:  Positive for leg swelling. Negative for chest pain and palpitations.  Gastrointestinal:        No gerd  Neurological:  Negative for light-headedness and headaches.       Objective:   Vitals:   07/22/22 0959  BP: 130/74  Pulse: 80  Temp: 98.1 F (36.7 C)  SpO2: 97%   BP  Readings from Last 3 Encounters:  07/22/22 130/74  02/22/22 (!) 142/71  01/21/22 126/76   Wt Readings from Last 3 Encounters:  07/22/22 256 lb (116.1 kg)  01/21/22 266 lb 6.4 oz (120.8 kg)  10/04/21 272 lb (123.4 kg)   Body mass index is 41.32 kg/m.    Physical Exam Constitutional:      General: He is not in acute distress.    Appearance: Normal appearance. He is not ill-appearing.  HENT:     Head: Normocephalic and atraumatic.  Eyes:     Conjunctiva/sclera: Conjunctivae normal.  Cardiovascular:     Rate and Rhythm: Normal rate and regular rhythm.     Heart sounds: Normal heart sounds.  Pulmonary:     Effort: Pulmonary effort is normal. No respiratory distress.     Breath sounds: Normal breath sounds. No wheezing or rales.  Musculoskeletal:     Right lower leg: No edema.     Left lower leg: No edema.  Skin:    General: Skin is warm and dry.     Findings: No rash.  Neurological:     Mental Status: He is alert. Mental status is at baseline.  Psychiatric:        Mood and Affect: Mood  normal.        Lab Results  Component Value Date   WBC 7.6 01/21/2022   HGB 14.7 01/21/2022   HCT 43.8 01/21/2022   PLT 216.0 01/21/2022   GLUCOSE 169 (H) 01/21/2022   CHOL 107 01/21/2022   TRIG 129.0 01/21/2022   HDL 40.80 01/21/2022   LDLDIRECT 85.0 01/08/2021   LDLCALC 40 01/21/2022   ALT 26 01/21/2022   AST 16 01/21/2022   NA 139 01/21/2022   K 4.2 01/21/2022   CL 102 01/21/2022   CREATININE 1.05 01/21/2022   BUN 14 01/21/2022   CO2 25 01/21/2022   TSH 1.66 01/08/2021   PSA 6.18 (H) 06/04/2015   HGBA1C 6.8 (H) 01/21/2022   MICROALBUR <0.7 07/09/2021     Assessment & Plan:    See Problem List for Assessment and Plan of chronic medical problems.

## 2022-07-21 NOTE — Patient Instructions (Addendum)
      Blood work was ordered.   The lab is on the first floor.     Medications changes include :   none      Return in about 6 months (around 01/22/2023) for Physical Exam.

## 2022-07-22 ENCOUNTER — Ambulatory Visit (INDEPENDENT_AMBULATORY_CARE_PROVIDER_SITE_OTHER): Payer: Medicare Other | Admitting: Internal Medicine

## 2022-07-22 VITALS — BP 130/74 | HR 80 | Temp 98.1°F | Ht 66.0 in | Wt 256.0 lb

## 2022-07-22 DIAGNOSIS — Z125 Encounter for screening for malignant neoplasm of prostate: Secondary | ICD-10-CM

## 2022-07-22 DIAGNOSIS — E119 Type 2 diabetes mellitus without complications: Secondary | ICD-10-CM

## 2022-07-22 DIAGNOSIS — G479 Sleep disorder, unspecified: Secondary | ICD-10-CM

## 2022-07-22 DIAGNOSIS — R4587 Impulsiveness: Secondary | ICD-10-CM

## 2022-07-22 DIAGNOSIS — F419 Anxiety disorder, unspecified: Secondary | ICD-10-CM

## 2022-07-22 DIAGNOSIS — Z6841 Body Mass Index (BMI) 40.0 and over, adult: Secondary | ICD-10-CM

## 2022-07-22 DIAGNOSIS — Z86718 Personal history of other venous thrombosis and embolism: Secondary | ICD-10-CM

## 2022-07-22 DIAGNOSIS — R600 Localized edema: Secondary | ICD-10-CM

## 2022-07-22 DIAGNOSIS — I10 Essential (primary) hypertension: Secondary | ICD-10-CM

## 2022-07-22 DIAGNOSIS — E782 Mixed hyperlipidemia: Secondary | ICD-10-CM

## 2022-07-22 LAB — CBC WITH DIFFERENTIAL/PLATELET
Basophils Absolute: 0.1 10*3/uL (ref 0.0–0.1)
Basophils Relative: 0.9 % (ref 0.0–3.0)
Eosinophils Absolute: 0.4 10*3/uL (ref 0.0–0.7)
Eosinophils Relative: 4.3 % (ref 0.0–5.0)
HCT: 43.1 % (ref 39.0–52.0)
Hemoglobin: 14.5 g/dL (ref 13.0–17.0)
Lymphocytes Relative: 25.1 % (ref 12.0–46.0)
Lymphs Abs: 2.2 10*3/uL (ref 0.7–4.0)
MCHC: 33.6 g/dL (ref 30.0–36.0)
MCV: 91.7 fl (ref 78.0–100.0)
Monocytes Absolute: 0.7 10*3/uL (ref 0.1–1.0)
Monocytes Relative: 7.7 % (ref 3.0–12.0)
Neutro Abs: 5.4 10*3/uL (ref 1.4–7.7)
Neutrophils Relative %: 62 % (ref 43.0–77.0)
Platelets: 254 10*3/uL (ref 150.0–400.0)
RBC: 4.7 Mil/uL (ref 4.22–5.81)
RDW: 12.6 % (ref 11.5–15.5)
WBC: 8.7 10*3/uL (ref 4.0–10.5)

## 2022-07-22 LAB — COMPREHENSIVE METABOLIC PANEL
ALT: 23 U/L (ref 0–53)
AST: 16 U/L (ref 0–37)
Albumin: 3.8 g/dL (ref 3.5–5.2)
Alkaline Phosphatase: 43 U/L (ref 39–117)
BUN: 13 mg/dL (ref 6–23)
CO2: 32 mEq/L (ref 19–32)
Calcium: 9.5 mg/dL (ref 8.4–10.5)
Chloride: 101 mEq/L (ref 96–112)
Creatinine, Ser: 1.06 mg/dL (ref 0.40–1.50)
GFR: 72.12 mL/min (ref >60.00)
Glucose, Bld: 119 mg/dL — ABNORMAL HIGH (ref 70–99)
Potassium: 5 mEq/L (ref 3.5–5.1)
Sodium: 142 mEq/L (ref 135–145)
Total Bilirubin: 0.6 mg/dL (ref 0.2–1.2)
Total Protein: 6.1 g/dL (ref 6.0–8.3)

## 2022-07-22 LAB — HEMOGLOBIN A1C: Hgb A1c MFr Bld: 6.5 % (ref 4.6–6.5)

## 2022-07-22 LAB — LIPID PANEL
Cholesterol: 128 mg/dL (ref 0–200)
HDL: 40.4 mg/dL (ref >39.00)
LDL Cholesterol: 56 mg/dL (ref 0–99)
NonHDL: 88.07
Total CHOL/HDL Ratio: 3
Triglycerides: 160 mg/dL — ABNORMAL HIGH (ref 0.0–149.0)
VLDL: 32 mg/dL (ref 0.0–40.0)

## 2022-07-22 LAB — PSA, MEDICARE: PSA: 2.54 ng/ml (ref 0.10–4.00)

## 2022-07-22 LAB — MICROALBUMIN / CREATININE URINE RATIO
Creatinine,U: 81.9 mg/dL
Microalb Creat Ratio: 0.9 mg/g (ref 0.0–30.0)
Microalb, Ur: <0.7 mg/dL (ref 0.0–1.9)

## 2022-07-22 MED ORDER — RIVAROXABAN 10 MG PO TABS: 10.0000 mg | ORAL_TABLET | Freq: Every day | ORAL | 3 refills | Status: DC

## 2022-07-22 NOTE — Assessment & Plan Note (Signed)
Chronic Controlled Continue risperidone 0.5 mg at bedtime

## 2022-07-22 NOTE — Assessment & Plan Note (Addendum)
Chronic   Lab Results  Component Value Date   HGBA1C 6.8 (H) 01/21/2022   Sugars controlled Has cut down on the desserts Check A1c Continue metformin 500 mg twice daily Stressed regular exercise, diabetic diet Encouraged continued weight loss

## 2022-07-22 NOTE — Assessment & Plan Note (Signed)
Chronic No longer seeing psychiatry-he is stable and I will prescribe his medications Continue sertraline 50 mg daily, risperidone 0.5 mg nightly

## 2022-07-22 NOTE — Assessment & Plan Note (Signed)
Chronic Stable Continue Lasix 40 mg daily Continue compression socks daily Elevate when sitting, low-sodium diet encouraged Stressed regular exercise-walking Encouraged weight loss 

## 2022-07-22 NOTE — Assessment & Plan Note (Signed)
Chronic Blood pressure well controlled CMP Continue ramipril 5 mg daily 

## 2022-07-22 NOTE — Assessment & Plan Note (Signed)
Chronic Regular exercise and healthy diet encouraged Check lipid panel, CMP Continue Crestor 5 mg 3 times weekly 

## 2022-07-22 NOTE — Assessment & Plan Note (Signed)
Chronic No longer following with psychiatry Controlled Continue sertraline 50 mg daily, risperidone 0.5 mg nightly

## 2022-07-22 NOTE — Assessment & Plan Note (Signed)
Chronic Stressed the importance of weight loss Stressed as much exercise as possible Discussed decrease portions, decrease sweets/desserts.  Increase protein and vegetables

## 2022-09-09 ENCOUNTER — Ambulatory Visit (INDEPENDENT_AMBULATORY_CARE_PROVIDER_SITE_OTHER): Payer: Medicare Other | Admitting: Podiatry

## 2022-09-09 ENCOUNTER — Encounter: Payer: Self-pay | Admitting: Podiatry

## 2022-09-09 VITALS — BP 137/60

## 2022-09-09 DIAGNOSIS — B351 Tinea unguium: Secondary | ICD-10-CM

## 2022-09-09 DIAGNOSIS — M79675 Pain in left toe(s): Secondary | ICD-10-CM | POA: Diagnosis not present

## 2022-09-09 DIAGNOSIS — E119 Type 2 diabetes mellitus without complications: Secondary | ICD-10-CM

## 2022-09-09 DIAGNOSIS — M79674 Pain in right toe(s): Secondary | ICD-10-CM

## 2022-09-09 NOTE — Progress Notes (Signed)
This patient returns to my office for at risk foot care.  This patient requires this care by a professional since this patient will be at risk due to having diabetes.  This patient is unable to cut nails himself since the patient cannot reach his nails.These nails are painful walking and wearing shoes.  This patient presents for at risk foot care today.  General Appearance  Alert, conversant and in no acute stress.  Vascular  Dorsalis pedis and posterior tibial  pulses are not palpable due to swelling. palpable  bilaterally.  Capillary return is within normal limits  bilaterally. Temperature is within normal limits  bilaterally.  Neurologic  Senn-Weinstein monofilament wire test within normal limits  bilaterally. Muscle power within normal limits bilaterally.  Nails Thick disfigured discolored nails with subungual debris  from hallux to fifth toes bilaterally. No evidence of bacterial infection or drainage bilaterally.  Orthopedic  No limitations of motion  feet .  No crepitus or effusions noted.  No bony pathology or digital deformities noted.  Skin  normotropic skin with no porokeratosis noted bilaterally.  No signs of infections or ulcers noted.     Onychomycosis  Pain in right toes  Pain in left toes  Consent was obtained for treatment procedures.   Mechanical debridement of nails 1-5  bilaterally performed with a nail nipper.  Filed with dremel without incident.    Return office visit    10 weeks                 Told patient to return for periodic foot care and evaluation due to potential at risk complications.   Helane Gunther DPM

## 2022-09-30 LAB — HM DIABETES EYE EXAM

## 2022-11-25 ENCOUNTER — Encounter: Payer: Self-pay | Admitting: Podiatry

## 2022-11-25 ENCOUNTER — Ambulatory Visit (INDEPENDENT_AMBULATORY_CARE_PROVIDER_SITE_OTHER): Payer: Medicare Other | Admitting: Podiatry

## 2022-11-25 DIAGNOSIS — E119 Type 2 diabetes mellitus without complications: Secondary | ICD-10-CM | POA: Diagnosis not present

## 2022-11-25 DIAGNOSIS — B351 Tinea unguium: Secondary | ICD-10-CM | POA: Diagnosis not present

## 2022-11-25 DIAGNOSIS — M79675 Pain in left toe(s): Secondary | ICD-10-CM

## 2022-11-25 DIAGNOSIS — M79674 Pain in right toe(s): Secondary | ICD-10-CM | POA: Diagnosis not present

## 2022-11-25 NOTE — Progress Notes (Signed)
This patient returns to my office for at risk foot care.  This patient requires this care by a professional since this patient will be at risk due to having diabetes.  This patient is unable to cut nails himself since the patient cannot reach his nails.These nails are painful walking and wearing shoes.  This patient presents for at risk foot care today.  General Appearance  Alert, conversant and in no acute stress.  Vascular  Dorsalis pedis and posterior tibial  pulses are not palpable due to swelling. palpable  bilaterally.  Capillary return is within normal limits  bilaterally. Temperature is within normal limits  bilaterally.  Neurologic  Senn-Weinstein monofilament wire test within normal limits  bilaterally. Muscle power within normal limits bilaterally.  Nails Thick disfigured discolored nails with subungual debris  from hallux to fifth toes bilaterally. No evidence of bacterial infection or drainage bilaterally.  Orthopedic  No limitations of motion  feet .  No crepitus or effusions noted.  No bony pathology or digital deformities noted.  Skin  normotropic skin with no porokeratosis noted bilaterally.  No signs of infections or ulcers noted.     Onychomycosis  Pain in right toes  Pain in left toes  Consent was obtained for treatment procedures.   Mechanical debridement of nails 1-5  bilaterally performed with a nail nipper.  Filed with dremel without incident.    Return office visit    10 weeks                 Told patient to return for periodic foot care and evaluation due to potential at risk complications.   Helane Gunther DPM

## 2023-02-06 ENCOUNTER — Encounter: Payer: Self-pay | Admitting: Podiatry

## 2023-02-06 ENCOUNTER — Ambulatory Visit (INDEPENDENT_AMBULATORY_CARE_PROVIDER_SITE_OTHER): Payer: Medicare Other | Admitting: Podiatry

## 2023-02-06 DIAGNOSIS — E119 Type 2 diabetes mellitus without complications: Secondary | ICD-10-CM | POA: Diagnosis not present

## 2023-02-06 DIAGNOSIS — M79674 Pain in right toe(s): Secondary | ICD-10-CM

## 2023-02-06 DIAGNOSIS — M79675 Pain in left toe(s): Secondary | ICD-10-CM

## 2023-02-06 DIAGNOSIS — B351 Tinea unguium: Secondary | ICD-10-CM | POA: Diagnosis not present

## 2023-02-06 NOTE — Progress Notes (Signed)
This patient returns to my office for at risk foot care.  This patient requires this care by a professional since this patient will be at risk due to having diabetes.  This patient is unable to cut nails himself since the patient cannot reach his nails.These nails are painful walking and wearing shoes.  This patient presents for at risk foot care today.  General Appearance  Alert, conversant and in no acute stress.  Vascular  Dorsalis pedis and posterior tibial  pulses are not palpable due to swelling. palpable  bilaterally.  Capillary return is within normal limits  bilaterally. Temperature is within normal limits  bilaterally.  Neurologic  Senn-Weinstein monofilament wire test within normal limits  bilaterally. Muscle power within normal limits bilaterally.  Nails Thick disfigured discolored nails with subungual debris  from hallux to fifth toes bilaterally. No evidence of bacterial infection or drainage bilaterally.  Orthopedic  No limitations of motion  feet .  No crepitus or effusions noted.  No bony pathology or digital deformities noted.  Skin  normotropic skin with no porokeratosis noted bilaterally.  No signs of infections or ulcers noted.     Onychomycosis  Pain in right toes  Pain in left toes  Consent was obtained for treatment procedures.   Mechanical debridement of nails 1-5  bilaterally performed with a nail nipper.  Filed with dremel without incident.    Return office visit    12  weeks                 Told patient to return for periodic foot care and evaluation due to potential at risk complications.   Helane Gunther DPM

## 2023-02-08 ENCOUNTER — Encounter: Payer: Self-pay | Admitting: Internal Medicine

## 2023-02-08 NOTE — Patient Instructions (Addendum)
Call [facility] to schedule your colonoscopy Phone: [phone]    Blood work was ordered.       Medications changes include :   None    A referral was ordered and someone will call you to schedule an appointment.     Return in about 6 months (around 08/09/2023) for follow up.   Health Maintenance, Male Adopting a healthy lifestyle and getting preventive care are important in promoting health and wellness. Ask your health care provider about: The right schedule for you to have regular tests and exams. Things you can do on your own to prevent diseases and keep yourself healthy. What should I know about diet, weight, and exercise? Eat a healthy diet  Eat a diet that includes plenty of vegetables, fruits, low-fat dairy products, and lean protein. Do not eat a lot of foods that are high in solid fats, added sugars, or sodium. Maintain a healthy weight Body mass index (BMI) is a measurement that can be used to identify possible weight problems. It estimates body fat based on height and weight. Your health care provider can help determine your BMI and help you achieve or maintain a healthy weight. Get regular exercise Get regular exercise. This is one of the most important things you can do for your health. Most adults should: Exercise for at least 150 minutes each week. The exercise should increase your heart rate and make you sweat (moderate-intensity exercise). Do strengthening exercises at least twice a week. This is in addition to the moderate-intensity exercise. Spend less time sitting. Even light physical activity can be beneficial. Watch cholesterol and blood lipids Have your blood tested for lipids and cholesterol at 69 years of age, then have this test every 5 years. You may need to have your cholesterol levels checked more often if: Your lipid or cholesterol levels are high. You are older than 69 years of age. You are at high risk for heart disease. What should I  know about cancer screening? Many types of cancers can be detected early and may often be prevented. Depending on your health history and family history, you may need to have cancer screening at various ages. This may include screening for: Colorectal cancer. Prostate cancer. Skin cancer. Lung cancer. What should I know about heart disease, diabetes, and high blood pressure? Blood pressure and heart disease High blood pressure causes heart disease and increases the risk of stroke. This is more likely to develop in people who have high blood pressure readings or are overweight. Talk with your health care provider about your target blood pressure readings. Have your blood pressure checked: Every 3-5 years if you are 24-66 years of age. Every year if you are 72 years old or older. If you are between the ages of 39 and 36 and are a current or former smoker, ask your health care provider if you should have a one-time screening for abdominal aortic aneurysm (AAA). Diabetes Have regular diabetes screenings. This checks your fasting blood sugar level. Have the screening done: Once every three years after age 20 if you are at a normal weight and have a low risk for diabetes. More often and at a younger age if you are overweight or have a high risk for diabetes. What should I know about preventing infection? Hepatitis B If you have a higher risk for hepatitis B, you should be screened for this virus. Talk with your health care provider to find out if you are at risk  for hepatitis B infection. Hepatitis C Blood testing is recommended for: Everyone born from [birth year] through 1967. Anyone with known risk factors for hepatitis C. Sexually transmitted infections (STIs) You should be screened each year for STIs, including gonorrhea and chlamydia, if: You are sexually active and are younger than 69 years of age. You are older than 69 years of age and your health care provider tells you that you are at risk  for this type of infection. Your sexual activity has changed since you were last screened, and you are at increased risk for chlamydia or gonorrhea. Ask your health care provider if you are at risk. Ask your health care provider about whether you are at high risk for HIV. Your health care provider may recommend a prescription medicine to help prevent HIV infection. If you choose to take medicine to prevent HIV, you should first get tested for HIV. You should then be tested every 3 months for as long as you are taking the medicine. Follow these instructions at home: Alcohol use Do not drink alcohol if your health care provider tells you not to drink. If you drink alcohol: Limit how much you have to 0-2 drinks a day. Know how much alcohol is in your drink. In the [place] one drink equals one 12 oz bottle of beer (355 mL), one 5 oz glass of wine (148 mL), or one 1 oz glass of hard liquor (44 mL). Lifestyle Do not use any products that contain nicotine or tobacco. These products include cigarettes, chewing tobacco, and vaping devices, such as e-cigarettes. If you need help quitting, ask your health care provider. Do not use street drugs. Do not share needles. Ask your health care provider for help if you need support or information about quitting drugs. General instructions Schedule regular health, dental, and eye exams. Stay current with your vaccines. Tell your health care provider if: You often feel depressed. You have ever been abused or do not feel safe at home. Summary Adopting a healthy lifestyle and getting preventive care are important in promoting health and wellness. Follow your health care provider's instructions about healthy diet, exercising, and getting tested or screened for diseases. Follow your health care provider's instructions on monitoring your cholesterol and blood pressure. This information is not intended to replace advice given to you by your health care provider. Make  sure you discuss any questions you have with your health care provider. Document Revised: 07/27/2020 Document Reviewed: 07/27/2020 Elsevier Patient Education  2024 [employer].

## 2023-02-08 NOTE — Progress Notes (Unsigned)
Subjective:    Patient ID: Alejandro Brown, male    DOB: 12-23-1955, 67 y.o.   MRN: [id number]     HPI [name] is here for a physical exam and his chronic medical problems.      Medications and allergies reviewed with patient and updated if appropriate.  Current Outpatient Medications on File Prior to Visit  Medication Sig Dispense Refill   Cholecalciferol (VITAMIN D3) 50 MCG (2000 UT) TABS Take 1 tablet (50 mcg total) by mouth daily. 30 tablet 3   Elastic Bandages & Supports (MEDICAL COMPRESSION SOCKS) MISC Medium compression 20-30 mm Hg for chronic leg edema 4 each 0   furosemide (LASIX) 40 MG tablet Take 1 tablet (40 mg total) by mouth daily. 30 tablet 3   metFORMIN (GLUCOPHAGE) 500 MG tablet Take 1 tablet (500 mg total) by mouth 2 (two) times daily with a meal. 180 tablet 2   polyethylene glycol (MIRALAX / GLYCOLAX) 17 g packet Take 17 g by mouth daily.     ramipril (ALTACE) 5 MG capsule TAKE 1 CAPSULE BY MOUTH ONCE DAILY 7 capsule 10   risperiDONE (RISPERDAL) 0.5 MG tablet TAKE 1 TABLET BY MOUTH EVERY NIGHT AT BEDTIME 90 tablet 1   rivaroxaban (XARELTO) 10 MG TABS tablet Take 1 tablet (10 mg total) by mouth daily. 90 tablet 3   rosuvastatin (CRESTOR) 5 MG tablet TAKE 1 TABLET BY MOUTH THREE TIMES A WEEK ON MON, WEDS, FRI 12 tablet 4   sertraline (ZOLOFT) 50 MG tablet TAKE 1 TABLET BY MOUTH ONCE DAILY 90 tablet 1   No current facility-administered medications on file prior to visit.    Review of Systems     Objective:  There were no vitals filed for this visit. There were no vitals filed for this visit. There is no height or weight on file to calculate BMI.  BP Readings from Last 3 Encounters:  09/09/22 137/60  07/22/22 130/74  02/22/22 (!) 142/71    Wt Readings from Last 3 Encounters:  07/22/22 256 lb (116.1 kg)  01/21/22 266 lb 6.4 oz (120.8 kg)  10/04/21 272 lb (123.4 kg)      Physical Exam Constitutional: He appears well-developed and  well-nourished. No distress.  HENT:  Head: Normocephalic and atraumatic.  Right Ear: External ear normal.  Left Ear: External ear normal.  Normal ear canals and TM b/l  Mouth/Throat: Oropharynx is clear and moist. Eyes: Conjunctivae and EOM are normal.  Neck: Neck supple. No tracheal deviation present. No thyromegaly present.  No carotid bruit  Cardiovascular: Normal rate, regular rhythm, normal heart sounds and intact distal pulses.   No murmur heard.  No lower extremity edema. Pulmonary/Chest: Effort normal and breath sounds normal. No respiratory distress. He has no wheezes. He has no rales.  Abdominal: Soft. He exhibits no distension. There is no tenderness.  Genitourinary: deferred  Lymphadenopathy:   He has no cervical adenopathy.  Skin: Skin is warm and dry. He is not diaphoretic.  Psychiatric: He has a normal mood and affect. His behavior is normal.         Assessment & Plan:   Physical exam: Screening blood work  ordered Exercise    Weight   Substance abuse   none   Reviewed recommended immunizations.   Health Maintenance  Topic Date Due   Medicare Annual Wellness (AWV)  Never done   Zoster Vaccines- Shingrix (1 of 2) Never done   DTaP/Tdap/Td (2 - Td or Tdap) 08/08/2019  INFLUENZA VACCINE  10/20/2022   COVID-19 Vaccine (4 - 2023-24 season) 11/20/2022   Colonoscopy  01/16/2023   HEMOGLOBIN A1C  01/22/2023   Diabetic kidney evaluation - eGFR measurement  07/22/2023   Diabetic kidney evaluation - Urine ACR  07/22/2023   OPHTHALMOLOGY EXAM  09/30/2023   FOOT EXAM  02/06/2024   Pneumonia Vaccine 78+ Years old  Completed   Hepatitis C Screening  Completed   HPV VACCINES  Aged Out     See Problem List for Assessment and Plan of chronic medical problems.

## 2023-02-09 ENCOUNTER — Ambulatory Visit (INDEPENDENT_AMBULATORY_CARE_PROVIDER_SITE_OTHER): Payer: Medicare Other | Admitting: Internal Medicine

## 2023-02-09 ENCOUNTER — Encounter: Payer: Self-pay | Admitting: Internal Medicine

## 2023-02-09 VITALS — BP 122/74 | HR 95 | Temp 98.0°F | Ht 66.0 in | Wt 253.0 lb

## 2023-02-09 DIAGNOSIS — Z86718 Personal history of other venous thrombosis and embolism: Secondary | ICD-10-CM

## 2023-02-09 DIAGNOSIS — L8990 Pressure ulcer of unspecified site, unspecified stage: Secondary | ICD-10-CM | POA: Insufficient documentation

## 2023-02-09 DIAGNOSIS — Z23 Encounter for immunization: Secondary | ICD-10-CM

## 2023-02-09 DIAGNOSIS — L89102 Pressure ulcer of unspecified part of back, stage 2: Secondary | ICD-10-CM | POA: Diagnosis not present

## 2023-02-09 DIAGNOSIS — E118 Type 2 diabetes mellitus with unspecified complications: Secondary | ICD-10-CM | POA: Diagnosis not present

## 2023-02-09 DIAGNOSIS — G479 Sleep disorder, unspecified: Secondary | ICD-10-CM

## 2023-02-09 DIAGNOSIS — Z7984 Long term (current) use of oral hypoglycemic drugs: Secondary | ICD-10-CM

## 2023-02-09 DIAGNOSIS — I10 Essential (primary) hypertension: Secondary | ICD-10-CM

## 2023-02-09 DIAGNOSIS — Z0001 Encounter for general adult medical examination with abnormal findings: Secondary | ICD-10-CM

## 2023-02-09 DIAGNOSIS — Z0000 Encounter for general adult medical examination without abnormal findings: Secondary | ICD-10-CM

## 2023-02-09 DIAGNOSIS — Z6841 Body Mass Index (BMI) 40.0 and over, adult: Secondary | ICD-10-CM

## 2023-02-09 DIAGNOSIS — E782 Mixed hyperlipidemia: Secondary | ICD-10-CM | POA: Diagnosis not present

## 2023-02-09 DIAGNOSIS — F419 Anxiety disorder, unspecified: Secondary | ICD-10-CM

## 2023-02-09 DIAGNOSIS — R600 Localized edema: Secondary | ICD-10-CM

## 2023-02-09 LAB — CBC WITH DIFFERENTIAL/PLATELET
Basophils Absolute: 0.1 10*3/uL (ref 0.0–0.1)
Basophils Relative: 0.9 % (ref 0.0–3.0)
Eosinophils Absolute: 0.4 10*3/uL (ref 0.0–0.7)
Eosinophils Relative: 5.2 % — ABNORMAL HIGH (ref 0.0–5.0)
HCT: 44.3 % (ref 39.0–52.0)
Hemoglobin: 14.6 g/dL (ref 13.0–17.0)
Lymphocytes Relative: 22.9 % (ref 12.0–46.0)
Lymphs Abs: 1.7 10*3/uL (ref 0.7–4.0)
MCHC: 33.1 g/dL (ref 30.0–36.0)
MCV: 92.6 fL (ref 78.0–100.0)
Monocytes Absolute: 0.6 10*3/uL (ref 0.1–1.0)
Monocytes Relative: 8.2 % (ref 3.0–12.0)
Neutro Abs: 4.8 10*3/uL (ref 1.4–7.7)
Neutrophils Relative %: 62.8 % (ref 43.0–77.0)
Platelets: 254 10*3/uL (ref 150.0–400.0)
RBC: 4.78 Mil/uL (ref 4.22–5.81)
RDW: 12.5 % (ref 11.5–15.5)
WBC: 7.6 10*3/uL (ref 4.0–10.5)

## 2023-02-09 LAB — COMPREHENSIVE METABOLIC PANEL
ALT: 17 U/L (ref 0–53)
AST: 17 U/L (ref 0–37)
Albumin: 3.8 g/dL (ref 3.5–5.2)
Alkaline Phosphatase: 47 U/L (ref 39–117)
BUN: 14 mg/dL (ref 6–23)
CO2: 28 meq/L (ref 19–32)
Calcium: 9.2 mg/dL (ref 8.4–10.5)
Chloride: 102 meq/L (ref 96–112)
Creatinine, Ser: 1.17 mg/dL (ref 0.40–1.50)
GFR: 63.81 mL/min (ref >60.00)
Glucose, Bld: 122 mg/dL — ABNORMAL HIGH (ref 70–99)
Potassium: 4.5 meq/L (ref 3.5–5.1)
Sodium: 139 meq/L (ref 135–145)
Total Bilirubin: 0.5 mg/dL (ref 0.2–1.2)
Total Protein: 6.4 g/dL (ref 6.0–8.3)

## 2023-02-09 LAB — LIPID PANEL
Cholesterol: 117 mg/dL (ref 0–200)
HDL: 39 mg/dL — ABNORMAL LOW (ref >39.00)
LDL Cholesterol: 57 mg/dL (ref 0–99)
NonHDL: 77.59
Total CHOL/HDL Ratio: 3
Triglycerides: 104 mg/dL (ref 0.0–149.0)
VLDL: 20.8 mg/dL (ref 0.0–40.0)

## 2023-02-09 LAB — HEMOGLOBIN A1C: Hgb A1c MFr Bld: 6.6 % — ABNORMAL HIGH (ref 4.6–6.5)

## 2023-02-09 MED ORDER — METFORMIN HCL ER 500 MG PO TB24: 500.0000 mg | ORAL_TABLET | Freq: Two times a day (BID) | ORAL | 1 refills | Status: DC

## 2023-02-09 NOTE — Assessment & Plan Note (Signed)
Chronic Controlled Continue risperidone 0.5 mg at bedtime

## 2023-02-09 NOTE — Assessment & Plan Note (Signed)
Acute Has been having some itching on his back, which is actually a pressure sore He sits in his rollator and has his back against the back strap which is likely the cause Likely initially a blister-skin has come off and he now has a shallow ulcer the size of a large orange He denies any pain No active bleeding or discharge-area does not look infected Antibacterial ointment and dressing applied Sister-in-law will monitor and help change the bandage

## 2023-02-09 NOTE — Assessment & Plan Note (Signed)
Chronic Regular exercise and healthy diet encouraged Check lipid panel, CMP Continue Crestor 5 mg 3 times weekly

## 2023-02-09 NOTE — Assessment & Plan Note (Signed)
Chronic Stable Continue Lasix 40 mg daily Continue compression socks daily Elevate when sitting, low-sodium diet encouraged Stressed regular exercise-walking Encouraged weight loss

## 2023-02-09 NOTE — Assessment & Plan Note (Signed)
Chronic  Lab Results  Component Value Date   HGBA1C 6.6 (H) 02/09/2023   Sugars controlled Has cut down on the desserts/sugar Check A1c Continue metformin 500 mg twice daily-changed to extended release-?  Causing diarrhea Stressed regular exercise, diabetic diet Encouraged continued weight loss

## 2023-02-09 NOTE — Assessment & Plan Note (Signed)
Chronic No longer following with psychiatry Controlled Continue sertraline 50 mg daily, risperidone 0.5 mg nightly

## 2023-02-09 NOTE — Assessment & Plan Note (Signed)
Chronic Stressed the importance of weight loss Stressed as much exercise as possible-he is currently only walking to get a place to place-stressed walking for exercise Discussed decrease portions, decrease sweets/desserts.  Increase protein and vegetables

## 2023-02-09 NOTE — Assessment & Plan Note (Signed)
Chronic Blood pressure well controlled CMP Continue ramipril 5 mg daily

## 2023-02-09 NOTE — Assessment & Plan Note (Signed)
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

## 2023-03-20 ENCOUNTER — Telehealth: Payer: Self-pay | Admitting: Internal Medicine

## 2023-03-20 ENCOUNTER — Other Ambulatory Visit: Payer: Self-pay | Admitting: Internal Medicine

## 2023-03-20 MED ORDER — SERTRALINE HCL 50 MG PO TABS: 50.0000 mg | ORAL_TABLET | Freq: Every day | ORAL | 1 refills | Status: DC

## 2023-03-20 MED ORDER — RISPERIDONE 0.5 MG PO TABS: 0.5000 mg | ORAL_TABLET | Freq: Every day | ORAL | 0 refills | Status: DC

## 2023-03-20 MED ORDER — ROSUVASTATIN CALCIUM 5 MG PO TABS: ORAL_TABLET | ORAL | 4 refills | Status: DC

## 2023-03-20 NOTE — Telephone Encounter (Signed)
Copied from CRM [id number]. Topic: Clinical - Medication Refill >> Mar 20, 2023 10:10 AM Isabell A wrote: Most Recent Primary Care Visit:  Provider: BURNS, Bobette Mo  Department: [facility]  Visit Type: OFFICE VISIT  Date: 02/09/2023  Medication: ***  Has the patient contacted their pharmacy?  (Agent: If no, request that the patient contact the pharmacy for the refill. If patient does not wish to contact the pharmacy document the reason why and proceed with request.) (Agent: If yes, when and what did the pharmacy advise?)  Is this the correct pharmacy for this prescription?  If no, delete pharmacy and type the correct one.  This is the patient's preferred pharmacy:  [facility] - [place], [place] - [address] [place] [address] Phone: [phone] Fax: [phone]   Has the prescription been filled recently?   Is the patient out of the medication?   Has the patient been seen for an appointment in the last year OR does the patient have an upcoming appointment?   Can we respond through MyChart?   Agent: Please be advised that Rx refills may take up to 3 business days. We ask that you follow-up with your pharmacy.

## 2023-03-20 NOTE — Telephone Encounter (Signed)
Copied from CRM [id number]. Topic: Clinical - Prescription Issue >> Mar 20, 2023 10:12 AM Isabell A wrote: Reason for CRM: Most Recent Primary Care Visit:  Provider: BURNS, Bobette Mo  Department: [facility]  Visit Type: OFFICE VISIT  Date: 02/09/2023  Medication: rosuvastatin (CRESTOR) 5 MG tablet   sertraline (ZOLOFT) 50 MG tablet   risperiDONE (RISPERDAL) 0.5 MG tablet   Has the patient contacted their pharmacy? Yes (Agent: If no, request that the patient contact the pharmacy for the refill. If patient does not wish to contact the pharmacy document the reason why and proceed with request.) (Agent: If yes, when and what did the pharmacy advise?) Pharmacist calling for patient.  Is this the correct pharmacy for this prescription? Yes If no, delete pharmacy and type the correct one.  This is the patient's preferred pharmacy:  [facility] - [place], [place] - [address] [place] [address] Phone: [phone] Fax: [phone]   Has the prescription been filled recently? Yes  Is the patient out of the medication? Yes  Has the patient been seen for an appointment in the last year OR does the patient have an upcoming appointment? Yes  Can we respond through MyChart? Yes  Agent: Please be advised that Rx refills may take up to 3 business days. We ask that you follow-up with your pharmacy.

## 2023-03-20 NOTE — Telephone Encounter (Signed)
Duplicate message.  Pharmacy has already faxed over script.

## 2023-04-07 ENCOUNTER — Encounter: Payer: Self-pay | Admitting: Gastroenterology

## 2023-04-13 ENCOUNTER — Other Ambulatory Visit: Payer: Self-pay | Admitting: Internal Medicine

## 2023-05-04 NOTE — Progress Notes (Unsigned)
Chief Complaint:*** Primary GI Doctor:***  HPI:  Patient is a  68  year old male patient with past medical history of *****who was referred to me by Pincus Sanes, MD on **** for a complaint of *** .    01/15/2018 colonoscopy with Dr. Rhea Belton, recall 5 years Impression:  - One 2 mm polyp in the cecum, removed with a cold biopsy forceps. Resected and retrieved.  - One 3 mm polyp in the ascending colon, removed with a cold biopsy forceps. Resected and retrieved.  - One 5 mm polyp in the proximal transverse colon, removed with a cold snare. Resected and retrieved.  - Diverticulosis in the sigmoid colon and in the ascending colon.  - The distal rectum and anal verge are normal on retroflexion view. 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  Polypectomy was performed using cold snare There was mild diverticulosis noted in the left colon Path: Diagnosis Surgical [P], descending and transverse, polyp (7) - TUBULAR ADENOMA (X2); NEGATIVE FOR HIGH GRADE DYSPLASIA OR MALIGNANCY. - FRAGMENTS OF HYPERPLASTIC POLYPS - MELANOSIS COLI   Interval History  Patient admits/denies GERD Patient admits/denies dysphagia Patient admits/denies nausea, vomiting, or weight loss  Patient admits/denies altered bowel habits Patient admits/denies abdominal pain Patient admits/denies rectal bleeding   Denies/Admits alcohol Denies/Admits smoking Denies/Admits NSAID use. Denies/Admits they are on blood thinners. Patient on Xarelto***  Patients last colonoscopy Patients last EGD  Patient's family history includes  Wt Readings from Last 3 Encounters:   02/09/23 253 lb (114.8 kg)  07/22/22 256 lb (116.1 kg)  01/21/22 266 lb 6.4 oz (120.8 kg)      Past Medical History:  Diagnosis Date   ALLERGIC RHINITIS    Allergy    Arthritis    Dependent edema    chronic L>R   Diverticulosis of colon    GERD (gastroesophageal reflux disease)    Hypertension    Mental retardation    mild   Obesity    OBSTRUCTIVE SLEEP APNEA    noncompliant with CPAP   Rupture of left patellar tendon    Sleep apnea    Type II or unspecified type diabetes mellitus without mention of complication, not stated as uncontrolled    diet controlled   Umbilical hernia     Past Surgical History:  Procedure Laterality Date   HERNIA REPAIR     NASAL SEPTUM SURGERY     PATELLAR TENDON REPAIR Left 08/15/2018   Procedure: LEFT PATELLAR TENDON REPAIR;  Surgeon: Tarry Kos, MD;  Location: [facility];  Service: Orthopedics;  Laterality: Left;   ROTATOR CUFF REPAIR Left 06/2014   [name]    Current Outpatient Medications  Medication Sig Dispense Refill   Cholecalciferol (VITAMIN D3) 50 MCG (2000 UT) TABS TAKE 1 TABLET BY MOUTH ONCE DAILY 2 tablet 10   Elastic Bandages & Supports (MEDICAL COMPRESSION SOCKS) MISC Medium compression 20-30 mm Hg for chronic leg edema 4 each 0   furosemide (LASIX) 40 MG tablet Take 1 tablet (40 mg total) by mouth daily. 30 tablet 3   metFORMIN (GLUCOPHAGE-XR) 500 MG 24 hr tablet Take 1 tablet (500 mg total) by mouth 2 (two) times daily with a meal. 180 tablet  1   polyethylene glycol (MIRALAX / GLYCOLAX) 17 g packet Take 17 g by mouth daily.     ramipril (ALTACE) 5 MG capsule TAKE 1 CAPSULE BY MOUTH ONCE DAILY 7 capsule 10   risperiDONE (RISPERDAL) 0.5 MG tablet TAKE 1 TABLET BY MOUTH EVERY NIGHT AT BEDTIME 90 tablet 1   rivaroxaban (XARELTO) 10 MG TABS tablet Take 1 tablet (10 mg total) by mouth daily. 90 tablet 3   rosuvastatin (CRESTOR) 5 MG tablet TAKE 1 TABLET BY MOUTH THREE TIMES A WEEK ON MON, WEDS, FRI 12 tablet 4    sertraline (ZOLOFT) 50 MG tablet Take 1 tablet (50 mg total) by mouth daily. 90 tablet 1   No current facility-administered medications for this visit.    Allergies as of 05/05/2023 - Review Complete 02/08/2023  Allergen Reaction Noted   Clarithromycin Nausea Only 07/24/2010   Eliquis [apixaban] Other (See Comments) 12/26/2019    Family History  Problem Relation Age of Onset   Breast cancer Sister    Breast cancer Other    Colon cancer Neg Hx    Esophageal cancer Neg Hx    Rectal cancer Neg Hx    Stomach cancer Neg Hx     Review of Systems:    Constitutional: No weight loss, fever, chills, weakness or fatigue HEENT: Eyes: No change in vision               Ears, Nose, Throat:  No change in hearing or congestion Skin: No rash or itching Cardiovascular: No chest pain, chest pressure or palpitations   Respiratory: No SOB or cough Gastrointestinal: See HPI and otherwise negative Genitourinary: No dysuria or change in urinary frequency Neurological: No headache, dizziness or syncope Musculoskeletal: No new muscle or joint pain Hematologic: No bleeding or bruising Psychiatric: No history of depression or anxiety    Physical Exam:  Vital signs: There were no vitals taken for this visit.  Constitutional:   Pleasant Caucasian male*** appears to be in NAD, Well developed, Well nourished, alert and cooperative Head:  Normocephalic and atraumatic. Eyes:   PEERL, EOMI. No icterus. Conjunctiva pink. Ears:  Normal auditory acuity. Neck:  Supple Throat: Oral cavity and pharynx without inflammation, swelling or lesion.  Respiratory: Respirations even and unlabored. Lungs clear to auscultation bilaterally.   No wheezes, crackles, or rhonchi.  Cardiovascular: Normal S1, S2. Regular rate and rhythm. No peripheral edema, cyanosis or pallor.  Gastrointestinal:  Soft, nondistended, nontender. No rebound or guarding. Normal bowel sounds. No appreciable masses or hepatomegaly. Rectal:  Not  performed.  Anoscopy: Msk:  Symmetrical without gross deformities. Without edema, no deformity or joint abnormality.  Neurologic:  Alert and  oriented x4;  grossly normal neurologically.  Skin:   Dry and intact without significant lesions or rashes. Psychiatric: Oriented to person, place and time. Demonstrates good judgement and reason without abnormal affect or behaviors.  RELEVANT LABS AND IMAGING: CBC    Latest Ref Rng & Units 02/09/2023   11:19 AM 07/22/2022   10:41 AM 01/21/2022    9:36 AM  CBC  WBC 4.0 - 10.5 K/uL 7.6  8.7  7.6   Hemoglobin 13.0 - 17.0 g/dL 16.1  09.6  04.5   Hematocrit 39.0 - 52.0 % 44.3  43.1  43.8   Platelets 150.0 - 400.0 K/uL 254.0  254.0  216.0      CMP     Latest Ref Rng & Units 02/09/2023   11:19 AM 07/22/2022   10:41 AM 01/21/2022  9:36 AM  CMP  Glucose 70 - 99 mg/dL 161  096  045   BUN 6 - 23 mg/dL 14  13  14    Creatinine 0.40 - 1.50 mg/dL 4.09  8.11  9.14   Sodium 135 - 145 mEq/L 139  142  139   Potassium 3.5 - 5.1 mEq/L 4.5  5.0  4.2   Chloride 96 - 112 mEq/L 102  101  102   CO2 19 - 32 mEq/L 28  32  25   Calcium 8.4 - 10.5 mg/dL 9.2  9.5  9.4   Total Protein 6.0 - 8.3 g/dL 6.4  6.1  6.6   Total Bilirubin 0.2 - 1.2 mg/dL 0.5  0.6  0.6   Alkaline Phos 39 - 117 U/L 47  43  44   AST 0 - 37 U/L 17  16  16    ALT 0 - 53 U/L 17  23  26       Lab Results  Component Value Date   TSH 1.66 01/08/2021     Assessment: 1. ***  Plan: -Schedule for a colonoscopy. The risks and benefits of colonoscopy with possible polypectomy / biopsies were discussed and the patient agrees to proceed.  Hold ***  *** days before procedure - will instruct when and how to resume after procedure. Risks and benefits of procedure including bleeding, perforation, infection, missed lesions, medication reactions and possible hospitalization or surgery if complications occur explained. Additional rare but real risk of cardiovascular event such as heart attack or  ischemia/infarct of other organs off *** explained and need to seek urgent help if this occurs. Will communicate by phone or EMR with patient's prescribing provider that to confirm holding *** is reasonable in this case.       Thank you for the courtesy of this consult. Please call me with any questions or concerns.   Regine Christian, FNP-C [facility] 05/04/2023, 9:35 AM  Cc: Pincus Sanes, MD

## 2023-05-05 ENCOUNTER — Encounter: Payer: Self-pay | Admitting: Gastroenterology

## 2023-05-05 ENCOUNTER — Other Ambulatory Visit: Payer: Self-pay

## 2023-05-05 ENCOUNTER — Ambulatory Visit: Payer: Medicare Other | Admitting: Gastroenterology

## 2023-05-05 VITALS — BP 108/60 | HR 94 | Ht 66.0 in | Wt 255.0 lb

## 2023-05-05 DIAGNOSIS — Z860100 Personal history of colon polyps, unspecified: Secondary | ICD-10-CM

## 2023-05-05 DIAGNOSIS — Z860101 Personal history of adenomatous and serrated colon polyps: Secondary | ICD-10-CM | POA: Diagnosis not present

## 2023-05-05 DIAGNOSIS — K219 Gastro-esophageal reflux disease without esophagitis: Secondary | ICD-10-CM

## 2023-05-05 NOTE — Telephone Encounter (Signed)
Alejandro Brown 06-29-1955 [id number]  @DATE @   Dear Dr. Lawerance Bach:  We have scheduled the above named patient for a(n) endoscopic procedure. Our records show that (s)he is on anticoagulation therapy.  Please advise as to whether the patient may come off their therapy of Xarelto 2 days prior to their procedure which is scheduled for 06/22/2023.  Please route your response to [facility] or fax response to [phone].  Sincerely,    [facility]

## 2023-05-05 NOTE — Patient Instructions (Signed)
You have been scheduled for a colonoscopy. Please follow written instructions given to you at your visit today.   If you use inhalers (even only as needed), please bring them with you on the day of your procedure.  DO NOT TAKE 7 DAYS PRIOR TO TEST- Trulicity (dulaglutide) Ozempic, Wegovy (semaglutide) Mounjaro (tirzepatide) Bydureon Bcise (exanatide extended release)  DO NOT TAKE 1 DAY PRIOR TO YOUR TEST Rybelsus (semaglutide) Adlyxin (lixisenatide) Victoza (liraglutide) Byetta (exanatide) ___________________________________________________________________________

## 2023-05-10 ENCOUNTER — Ambulatory Visit: Payer: Medicare Other | Admitting: Podiatry

## 2023-05-10 NOTE — Progress Notes (Signed)
 Addendum: Reviewed and agree with assessment and management plan. Asha Grumbine, Carie Caddy, MD

## 2023-05-18 ENCOUNTER — Ambulatory Visit (INDEPENDENT_AMBULATORY_CARE_PROVIDER_SITE_OTHER): Payer: Medicare Other | Admitting: Podiatry

## 2023-05-18 ENCOUNTER — Encounter: Payer: Self-pay | Admitting: Podiatry

## 2023-05-18 DIAGNOSIS — B351 Tinea unguium: Secondary | ICD-10-CM

## 2023-05-18 DIAGNOSIS — M79675 Pain in left toe(s): Secondary | ICD-10-CM

## 2023-05-18 DIAGNOSIS — E119 Type 2 diabetes mellitus without complications: Secondary | ICD-10-CM

## 2023-05-18 DIAGNOSIS — M79674 Pain in right toe(s): Secondary | ICD-10-CM

## 2023-05-18 NOTE — Progress Notes (Signed)
 This patient returns to my office for at risk foot care.  This patient requires this care by a professional since this patient will be at risk due to having diabetes.  This patient is unable to cut nails himself since the patient cannot reach his nails.These nails are painful walking and wearing shoes.  This patient presents for at risk foot care today.  General Appearance  Alert, conversant and in no acute stress.  Vascular  Dorsalis pedis and posterior tibial  pulses are not palpable due to swelling. palpable  bilaterally.  Capillary return is within normal limits  bilaterally. Temperature is within normal limits  bilaterally.  Neurologic  Senn-Weinstein monofilament wire test within normal limits  bilaterally. Muscle power within normal limits bilaterally.  Nails Thick disfigured discolored nails with subungual debris  from hallux to fifth toes bilaterally. No evidence of bacterial infection or drainage bilaterally.  Orthopedic  No limitations of motion  feet .  No crepitus or effusions noted.  No bony pathology or digital deformities noted.  Skin  normotropic skin with no porokeratosis noted bilaterally.  No signs of infections or ulcers noted.     Onychomycosis  Pain in right toes  Pain in left toes  Consent was obtained for treatment procedures.   Mechanical debridement of nails 1-5  bilaterally performed with a nail nipper.  Filed with dremel without incident.    Return office visit    12  weeks                 Told patient to return for periodic foot care and evaluation due to potential at risk complications.   Helane Gunther DPM

## 2023-05-26 ENCOUNTER — Telehealth: Payer: Self-pay

## 2023-05-26 NOTE — Telephone Encounter (Signed)
 See patient message on 04-18-23 "feeling under the weather?".  Request to hold was attached to that message and Dr. Lawerance Bach responded to Alonna Buckler, CMA that it is OK to hold Xarelto for 2 days prior to the procedure.  Called and Left a message for patient to hold on 4-1 and 4-2. Dr. Rhea Belton will let him know when he can resume after his procedure. Asked that patient call back to confirm understanding.

## 2023-05-26 NOTE — Telephone Encounter (Signed)
 Inbound call from patient stating message has been received. Please advise, thank you.

## 2023-06-22 ENCOUNTER — Ambulatory Visit: Payer: Federal, State, Local not specified - PPO | Admitting: Internal Medicine

## 2023-06-22 ENCOUNTER — Encounter: Payer: Self-pay | Admitting: Internal Medicine

## 2023-06-22 VITALS — BP 112/64 | HR 91 | Temp 98.2°F | Resp 20 | Ht 66.0 in | Wt 255.0 lb

## 2023-06-22 DIAGNOSIS — K648 Other hemorrhoids: Secondary | ICD-10-CM

## 2023-06-22 DIAGNOSIS — D125 Benign neoplasm of sigmoid colon: Secondary | ICD-10-CM

## 2023-06-22 DIAGNOSIS — Z1211 Encounter for screening for malignant neoplasm of colon: Secondary | ICD-10-CM

## 2023-06-22 DIAGNOSIS — Z860100 Personal history of colon polyps, unspecified: Secondary | ICD-10-CM | POA: Diagnosis not present

## 2023-06-22 DIAGNOSIS — K5730 Diverticulosis of large intestine without perforation or abscess without bleeding: Secondary | ICD-10-CM | POA: Diagnosis not present

## 2023-06-22 DIAGNOSIS — K6389 Other specified diseases of intestine: Secondary | ICD-10-CM | POA: Diagnosis not present

## 2023-06-22 MED ORDER — SODIUM CHLORIDE 0.9 % IV SOLN: 500.0000 mL | INTRAVENOUS | Status: DC

## 2023-06-22 NOTE — Patient Instructions (Signed)
 Please read handouts provided. Continue present medications. Await pathology results. Resume Xarelto ( rivaroxaban ) at prior dose today.   YOU HAD AN ENDOSCOPIC PROCEDURE TODAY AT THE [facility]:   Refer to the procedure report that was given to you for any specific questions about what was found during the examination.  If the procedure report does not answer your questions, please call your gastroenterologist to clarify.  If you requested that your care partner not be given the details of your procedure findings, then the procedure report has been included in a sealed envelope for you to review at your convenience later.  YOU SHOULD EXPECT: Some feelings of bloating in the abdomen. Passage of more gas than usual.  Walking can help get rid of the air that was put into your GI tract during the procedure and reduce the bloating. If you had a lower endoscopy (such as a colonoscopy or flexible sigmoidoscopy) you may notice spotting of blood in your stool or on the toilet paper. If you underwent a bowel prep for your procedure, you may not have a normal bowel movement for a few days.  Please Note:  You might notice some irritation and congestion in your nose or some drainage.  This is from the oxygen used during your procedure.  There is no need for concern and it should clear up in a day or so.  SYMPTOMS TO REPORT IMMEDIATELY:  Following lower endoscopy (colonoscopy or flexible sigmoidoscopy):  Excessive amounts of blood in the stool  Significant tenderness or worsening of abdominal pains  Swelling of the abdomen that is new, acute  Fever of 100F or higher.  For urgent or emergent issues, a gastroenterologist can be reached at any hour by calling [phone]. Do not use MyChart messaging for urgent concerns.    DIET:  We do recommend a small meal at first, but then you may proceed to your regular diet.  Drink plenty of fluids but you should avoid alcoholic beverages for 24  hours.  ACTIVITY:  You should plan to take it easy for the rest of today and you should NOT DRIVE or use heavy machinery until tomorrow (because of the sedation medicines used during the test).    FOLLOW UP: Our staff will call the number listed on your records the next business day following your procedure.  We will call around 7:15- 8:00 am to check on you and address any questions or concerns that you may have regarding the information given to you following your procedure. If we do not reach you, we will leave a message.     If any biopsies were taken you will be contacted by phone or by letter within the next 1-3 weeks.  Please call us at [phone] if you have not heard about the biopsies in 3 weeks.    SIGNATURES/CONFIDENTIALITY: You and/or your care partner have signed paperwork which will be entered into your electronic medical record.  These signatures attest to the fact that that the information above on your After Visit Summary has been reviewed and is understood.  Full responsibility of the confidentiality of this discharge information lies with you and/or your care-partner.

## 2023-06-22 NOTE — Op Note (Signed)
 [facility] Patient Name: Alejandro Brown Procedure Date: 06/22/2023 10:08 AM MRN: [id number] Endoscopist: Beverley Fiedler , MD, [id number] Age: 68 Referring MD:  Date of Birth: 29-Nov-1955 Gender: Male Account #: [id number] Procedure:                Colonoscopy Indications:              High risk colon cancer surveillance: Personal                            history of multiple adenomas, Last colonoscopy:                            October 2019 (TA x 3), Oct 2014 (TA x2) Medicines:                Monitored Anesthesia Care Procedure:                Pre-Anesthesia Assessment:                           - Prior to the procedure, a History and Physical                            was performed, and patient medications and                            allergies were reviewed. The patient's tolerance of                            previous anesthesia was also reviewed. The risks                            and benefits of the procedure and the sedation                            options and risks were discussed with the patient.                            All questions were answered, and informed consent                            was obtained. Prior Anticoagulants: The patient has                            taken Xarelto (rivaroxaban), last dose was 3 days                            prior to procedure. ASA Grade Assessment: III - A                            patient with severe systemic disease. After                            reviewing the risks and benefits, the patient was  deemed in satisfactory condition to undergo the                            procedure.                           After obtaining informed consent, the colonoscope                            was passed under direct vision. Throughout the                            procedure, the patient's blood pressure, pulse, and                            oxygen saturations were monitored continuously. The                             Olympus Scope SN: [id number] was introduced through                            the anus and advanced to the cecum, identified by                            transillumination. The colonoscopy was performed                            without difficulty. The patient tolerated the                            procedure well. The quality of the bowel                            preparation was good. The ileocecal valve,                            appendiceal orifice, and rectum were photographed. Scope In: 10:26:31 AM Scope Out: 10:40:26 AM Scope Withdrawal Time: 0 hours 11 minutes 8 seconds  Total Procedure Duration: 0 hours 13 minutes 55 seconds  Findings:                 The digital rectal exam was normal.                           A 5 mm polyp was found in the sigmoid colon. The                            polyp was sessile. The polyp was removed with a                            cold snare. Resection and retrieval were complete.                           Multiple medium-mouthed and small-mouthed  diverticula were found in the sigmoid colon,                            descending colon and ascending colon.                           Internal hemorrhoids were found during                            retroflexion. The hemorrhoids were small. Complications:            No immediate complications. Estimated Blood Loss:     Estimated blood loss was minimal. Impression:               - One 5 mm polyp in the sigmoid colon, removed with                            a cold snare. Resected and retrieved.                           - Mild diverticulosis in the sigmoid colon, in the                            descending colon and in the ascending colon.                           - Small internal hemorrhoids. Recommendation:           - Patient has a contact number available for                            emergencies. The signs and symptoms of potential                             delayed complications were discussed with the                            patient. Return to normal activities tomorrow.                            Written discharge instructions were provided to the                            patient.                           - Resume previous diet.                           - Continue present medications.                           - Await pathology results.                           - Repeat colonoscopy is recommended for  surveillance. The colonoscopy date will be                            determined after pathology results from today's                            exam become available for review.                           - Resume Xarelto (rivaroxaban) at prior dose today.                            Refer to managing physician for further adjustment                            of therapy. Beverley Fiedler, MD 06/22/2023 10:52:00 AM This report has been signed electronically.

## 2023-06-22 NOTE — Progress Notes (Signed)
 GASTROENTEROLOGY PROCEDURE H&P NOTE   Primary Care Physician: Pincus Sanes, MD    Reason for Procedure:   Hx of adenomas  Plan:    colonoscopy  Patient is appropriate for endoscopic procedure(s) in the ambulatory ([facility]) setting.  The nature of the procedure, as well as the risks, benefits, and alternatives were carefully and thoroughly reviewed with the patient. Ample time for discussion and questions allowed. The patient understood, was satisfied, and agreed to proceed.     HPI: Alejandro Brown is a 68 y.o. male who presents for colonoscopy.  Medical history as below.  Tolerated the prep.  No recent chest pain or shortness of breath.  No abdominal pain today. Xarelto on hold x 2 days  Past Medical History:  Diagnosis Date   ALLERGIC RHINITIS    Allergy    Arthritis    Dependent edema    chronic L>R   Diverticulosis of colon    GERD (gastroesophageal reflux disease)    Hypertension    Mental retardation    mild   Obesity    OBSTRUCTIVE SLEEP APNEA    noncompliant with CPAP   Rupture of left patellar tendon    Sleep apnea    Type II or unspecified type diabetes mellitus without mention of complication, not stated as uncontrolled    diet controlled   Umbilical hernia     Past Surgical History:  Procedure Laterality Date   COLONOSCOPY     HERNIA REPAIR     NASAL SEPTUM SURGERY     PATELLAR TENDON REPAIR Left 08/15/2018   Procedure: LEFT PATELLAR TENDON REPAIR;  Surgeon: Tarry Kos, MD;  Location: [facility];  Service: Orthopedics;  Laterality: Left;   ROTATOR CUFF REPAIR Left 06/2014   [name]    Prior to Admission medications   Medication Sig Start Date End Date Taking? Authorizing Provider  furosemide (LASIX) 40 MG tablet Take 1 tablet (40 mg total) by mouth daily. 03/29/22  Yes Burns, Bobette Mo, MD  metFORMIN (GLUCOPHAGE-XR) 500 MG 24 hr tablet Take 1 tablet (500 mg total) by mouth 2 (two) times daily with a meal. 02/09/23  Yes Burns,  Bobette Mo, MD  polyethylene glycol (MIRALAX / GLYCOLAX) 17 g packet Take 17 g by mouth daily.   Yes [provider]  ramipril (ALTACE) 5 MG capsule TAKE 1 CAPSULE BY MOUTH ONCE DAILY 03/29/22  Yes Burns, Bobette Mo, MD  risperiDONE (RISPERDAL) 0.5 MG tablet TAKE 1 TABLET BY MOUTH EVERY NIGHT AT BEDTIME 04/13/23  Yes Burns, Bobette Mo, MD  rosuvastatin (CRESTOR) 5 MG tablet TAKE 1 TABLET BY MOUTH THREE TIMES A WEEK ON MON, WEDS, FRI 03/20/23  Yes Burns, Bobette Mo, MD  sertraline (ZOLOFT) 50 MG tablet Take 1 tablet (50 mg total) by mouth daily. 03/20/23  Yes Burns, Bobette Mo, MD  Cholecalciferol (VITAMIN D3) 50 MCG (2000 UT) TABS TAKE 1 TABLET BY MOUTH ONCE DAILY Patient not taking: Reported on 06/22/2023 04/13/23   Pincus Sanes, MD  Elastic Bandages & Supports (MEDICAL COMPRESSION SOCKS) MISC Medium compression 20-30 mm Hg for chronic leg edema 09/20/16   Pincus Sanes, MD  rivaroxaban (XARELTO) 10 MG TABS tablet Take 1 tablet (10 mg total) by mouth daily. 07/22/22   Pincus Sanes, MD    Current Outpatient Medications  Medication Sig Dispense Refill   furosemide (LASIX) 40 MG tablet Take 1 tablet (40 mg total) by mouth daily. 30 tablet 3   metFORMIN (GLUCOPHAGE-XR) 500  MG 24 hr tablet Take 1 tablet (500 mg total) by mouth 2 (two) times daily with a meal. 180 tablet 1   polyethylene glycol (MIRALAX / GLYCOLAX) 17 g packet Take 17 g by mouth daily.     ramipril (ALTACE) 5 MG capsule TAKE 1 CAPSULE BY MOUTH ONCE DAILY 7 capsule 10   risperiDONE (RISPERDAL) 0.5 MG tablet TAKE 1 TABLET BY MOUTH EVERY NIGHT AT BEDTIME 90 tablet 1   rosuvastatin (CRESTOR) 5 MG tablet TAKE 1 TABLET BY MOUTH THREE TIMES A WEEK ON MON, WEDS, FRI 12 tablet 4   sertraline (ZOLOFT) 50 MG tablet Take 1 tablet (50 mg total) by mouth daily. 90 tablet 1   Cholecalciferol (VITAMIN D3) 50 MCG (2000 UT) TABS TAKE 1 TABLET BY MOUTH ONCE DAILY (Patient not taking: Reported on 06/22/2023) 2 tablet 10   Elastic Bandages & Supports (MEDICAL  COMPRESSION SOCKS) MISC Medium compression 20-30 mm Hg for chronic leg edema 4 each 0   rivaroxaban (XARELTO) 10 MG TABS tablet Take 1 tablet (10 mg total) by mouth daily. 90 tablet 3   Current Facility-Administered Medications  Medication Dose Route Frequency Provider Last Rate Last Admin   0.9 %  sodium chloride infusion  500 mL Intravenous Continuous Marah Park, Carie Caddy, MD        Allergies as of 06/22/2023 - Review Complete 06/22/2023  Allergen Reaction Noted   Clarithromycin Nausea Only 07/24/2010   Eliquis [apixaban] Other (See Comments) 12/26/2019    Family History  Problem Relation Age of Onset   Breast cancer Sister    Breast cancer Other    Colon cancer Neg Hx    Esophageal cancer Neg Hx    Rectal cancer Neg Hx    Stomach cancer Neg Hx     Social History   Socioeconomic History   Marital status: Single    Spouse name: n/a   Number of children: 0   Years of education: 12th grade   Highest education level: Not on file  Occupational History   Occupation: [employer]    Comment: Custodian/maintenance  Tobacco Use   Smoking status: Never   Smokeless tobacco: Never  Vaping Use   Vaping status: Never Used  Substance and Sexual Activity   Alcohol use: No    Alcohol/week: 0.0 standard drinks of alcohol   Drug use: No   Sexual activity: Not on file  Other Topics Concern   Not on file  Social History Narrative   Tree surgeon.   Lives with a relative who cooks and keeps up the home.   Social Drivers of Corporate investment banker Strain: Not on file  Food Insecurity: Not on file  Transportation Needs: Not on file  Physical Activity: Not on file  Stress: Not on file  Social Connections: Not on file  Intimate Partner Violence: Not on file    Physical Exam: Vital signs in last 24 hours: @BP  (!) 161/82   Pulse (!) 106   Temp 98.2 F (36.8 C) (Skin)   Ht 5\' 6"  (1.676 m)   Wt 255 lb (115.7 kg)   SpO2 93%   BMI 41.16 kg/m  GEN: NAD EYE: Sclerae  anicteric ENT: MMM CV: Non-tachycardic Pulm: CTA b/l GI: Soft, NT/ND NEURO:  Alert & Oriented x 3   Erick Blinks, MD [facility]  06/22/2023 10:19 AM

## 2023-06-22 NOTE — Progress Notes (Signed)
 To pacu, VSS. Report to RN.tb

## 2023-06-23 ENCOUNTER — Telehealth: Payer: Self-pay

## 2023-06-23 NOTE — Telephone Encounter (Signed)
  Follow up Call-     06/22/2023    9:18 AM  Call back number  Post procedure Call Back phone  # [phone]  Permission to leave phone message Yes     Patient questions:  Do you have a fever, pain , or abdominal swelling? No. Pain Score  0 *  Have you tolerated food without any problems? Yes.    Have you been able to return to your normal activities? Yes.    Do you have any questions about your discharge instructions: Diet   No. Medications  No. Follow up visit  No.  Do you have questions or concerns about your Care? No.  Actions: * If pain score is 4 or above: No action needed, pain <4.

## 2023-06-27 ENCOUNTER — Encounter: Payer: Self-pay | Admitting: Internal Medicine

## 2023-06-27 LAB — SURGICAL PATHOLOGY

## 2023-06-29 ENCOUNTER — Other Ambulatory Visit: Payer: Self-pay | Admitting: Internal Medicine

## 2023-08-10 ENCOUNTER — Other Ambulatory Visit: Payer: Self-pay | Admitting: Internal Medicine

## 2023-08-17 ENCOUNTER — Encounter: Payer: Self-pay | Admitting: Internal Medicine

## 2023-08-17 NOTE — Patient Instructions (Addendum)
      Blood work was ordered.       Medications changes include :   None    A referral was ordered and someone will call you to schedule an appointment.     Return in about 6 months (around 02/18/2024) for Physical Exam.

## 2023-08-17 NOTE — Progress Notes (Unsigned)
 Subjective:    Patient ID: Alejandro Brown, male    DOB: October 08, 1955, 68 y.o.   MRN: [id number]     HPI Alejandro Brown is here for follow up of his chronic medical problems.  Eating less sugars, eating smaller amounts.  Walking some.  Doing well with his medications.  He does not have any concerns.  Medications and allergies reviewed with patient and updated if appropriate.  Current Outpatient Medications on File Prior to Visit  Medication Sig Dispense Refill   Cholecalciferol (VITAMIN D3) 50 MCG (2000 UT) TABS TAKE 1 TABLET BY MOUTH ONCE DAILY (Patient not taking: Reported on 06/22/2023) 2 tablet 10   Elastic Bandages & Supports (MEDICAL COMPRESSION SOCKS) MISC Medium compression 20-30 mm Hg for chronic leg edema 4 each 0   furosemide  (LASIX ) 40 MG tablet Take 1 tablet (40 mg total) by mouth daily. 30 tablet 3   metFORMIN  (GLUCOPHAGE -XR) 500 MG 24 hr tablet TAKE 1 TABLET BY MOUTH TWICE DAILY WITH A MEAL 60 tablet 10   polyethylene glycol (MIRALAX  / GLYCOLAX ) 17 g packet Take 17 g by mouth daily.     ramipril  (ALTACE ) 5 MG capsule TAKE 1 CAPSULE BY MOUTH ONCE DAILY 7 capsule 10   risperiDONE  (RISPERDAL ) 0.5 MG tablet TAKE 1 TABLET BY MOUTH EVERY NIGHT AT BEDTIME 90 tablet 1   rosuvastatin  (CRESTOR ) 5 MG tablet TAKE 1 TABLET BY MOUTH THREE TIMES A WEEK ON MON, WEDS, FRI 12 tablet 10   sertraline  (ZOLOFT ) 50 MG tablet Take 1 tablet (50 mg total) by mouth daily. 90 tablet 1   XARELTO  10 MG TABS tablet TAKE 1 TABLET BY MOUTH ONCE DAILY *TAKE WITH FOOD* 30 tablet 10   No current facility-administered medications on file prior to visit.     Review of Systems  Constitutional:  Negative for fever.  Respiratory:  Negative for cough, shortness of breath and wheezing.   Cardiovascular:  Positive for leg swelling. Negative for chest pain and palpitations.  Neurological:  Negative for light-headedness and headaches.  Psychiatric/Behavioral:  Negative for dysphoric mood. The patient is not  nervous/anxious.        Objective:   Vitals:   08/18/23 0958  BP: 112/72  Pulse: (!) 102  Temp: 98.1 F (36.7 C)  SpO2: 100%   BP Readings from Last 3 Encounters:  08/18/23 112/72  06/22/23 112/64  05/05/23 108/60   Wt Readings from Last 3 Encounters:  08/18/23 255 lb (115.7 kg)  06/22/23 255 lb (115.7 kg)  05/05/23 255 lb (115.7 kg)   Body mass index is 41.16 kg/m.    Physical Exam Constitutional:      General: He is not in acute distress.    Appearance: Normal appearance. He is not ill-appearing.  HENT:     Head: Normocephalic and atraumatic.  Eyes:     Conjunctiva/sclera: Conjunctivae normal.  Cardiovascular:     Rate and Rhythm: Normal rate and regular rhythm.     Heart sounds: Normal heart sounds.  Pulmonary:     Effort: Pulmonary effort is normal. No respiratory distress.     Breath sounds: Normal breath sounds. No wheezing or rales.  Musculoskeletal:     Right lower leg: Edema (non-pitting) present.     Left lower leg: Edema (non-pitting) present.  Skin:    General: Skin is warm and dry.     Findings: No rash.  Neurological:     Mental Status: He is alert. Mental status is at baseline.  Psychiatric:        Mood and Affect: Mood normal.        Lab Results  Component Value Date   WBC 7.6 02/09/2023   HGB 14.6 02/09/2023   HCT 44.3 02/09/2023   PLT 254.0 02/09/2023   GLUCOSE 122 (H) 02/09/2023   CHOL 117 02/09/2023   TRIG 104.0 02/09/2023   HDL 39.00 (L) 02/09/2023   LDLDIRECT 85.0 01/08/2021   LDLCALC 57 02/09/2023   ALT 17 02/09/2023   AST 17 02/09/2023   NA 139 02/09/2023   K 4.5 02/09/2023   CL 102 02/09/2023   CREATININE 1.17 02/09/2023   BUN 14 02/09/2023   CO2 28 02/09/2023   TSH 1.66 01/08/2021   PSA 2.54 07/22/2022   HGBA1C 6.6 (H) 02/09/2023   MICROALBUR <0.7 07/22/2022     Assessment & Plan:    See Problem List for Assessment and Plan of chronic medical problems.

## 2023-08-18 ENCOUNTER — Ambulatory Visit: Payer: Federal, State, Local not specified - PPO | Admitting: Internal Medicine

## 2023-08-18 ENCOUNTER — Ambulatory Visit: Payer: Self-pay | Admitting: Internal Medicine

## 2023-08-18 VITALS — BP 112/72 | HR 102 | Temp 98.1°F | Ht 66.0 in | Wt 255.0 lb

## 2023-08-18 DIAGNOSIS — F419 Anxiety disorder, unspecified: Secondary | ICD-10-CM

## 2023-08-18 DIAGNOSIS — E782 Mixed hyperlipidemia: Secondary | ICD-10-CM | POA: Diagnosis not present

## 2023-08-18 DIAGNOSIS — I10 Essential (primary) hypertension: Secondary | ICD-10-CM

## 2023-08-18 DIAGNOSIS — Z125 Encounter for screening for malignant neoplasm of prostate: Secondary | ICD-10-CM

## 2023-08-18 DIAGNOSIS — Z86718 Personal history of other venous thrombosis and embolism: Secondary | ICD-10-CM | POA: Diagnosis not present

## 2023-08-18 DIAGNOSIS — E118 Type 2 diabetes mellitus with unspecified complications: Secondary | ICD-10-CM | POA: Diagnosis not present

## 2023-08-18 DIAGNOSIS — Z7984 Long term (current) use of oral hypoglycemic drugs: Secondary | ICD-10-CM

## 2023-08-18 DIAGNOSIS — G479 Sleep disorder, unspecified: Secondary | ICD-10-CM

## 2023-08-18 DIAGNOSIS — R600 Localized edema: Secondary | ICD-10-CM

## 2023-08-18 DIAGNOSIS — Z6841 Body Mass Index (BMI) 40.0 and over, adult: Secondary | ICD-10-CM

## 2023-08-18 LAB — CBC WITH DIFFERENTIAL/PLATELET
Basophils Absolute: 0.1 10*3/uL (ref 0.0–0.1)
Basophils Relative: 1.2 % (ref 0.0–3.0)
Eosinophils Absolute: 0.3 10*3/uL (ref 0.0–0.7)
Eosinophils Relative: 4.3 % (ref 0.0–5.0)
HCT: 44.6 % (ref 39.0–52.0)
Hemoglobin: 15 g/dL (ref 13.0–17.0)
Lymphocytes Relative: 24.4 % (ref 12.0–46.0)
Lymphs Abs: 2 10*3/uL (ref 0.7–4.0)
MCHC: 33.6 g/dL (ref 30.0–36.0)
MCV: 90.8 fl (ref 78.0–100.0)
Monocytes Absolute: 0.6 10*3/uL (ref 0.1–1.0)
Monocytes Relative: 7.4 % (ref 3.0–12.0)
Neutro Abs: 5 10*3/uL (ref 1.4–7.7)
Neutrophils Relative %: 62.7 % (ref 43.0–77.0)
Platelets: 229 10*3/uL (ref 150.0–400.0)
RBC: 4.91 Mil/uL (ref 4.22–5.81)
RDW: 12.4 % (ref 11.5–15.5)
WBC: 8 10*3/uL (ref 4.0–10.5)

## 2023-08-18 LAB — MICROALBUMIN / CREATININE URINE RATIO
Creatinine,U: 71.9 mg/dL
Microalb Creat Ratio: UNDETERMINED mg/g (ref 0.0–30.0)
Microalb, Ur: <0.7 mg/dL

## 2023-08-18 LAB — COMPREHENSIVE METABOLIC PANEL WITH GFR
ALT: 20 U/L (ref 0–53)
AST: 14 U/L (ref 0–37)
Albumin: 4.1 g/dL (ref 3.5–5.2)
Alkaline Phosphatase: 46 U/L (ref 39–117)
BUN: 17 mg/dL (ref 6–23)
CO2: 30 meq/L (ref 19–32)
Calcium: 9.6 mg/dL (ref 8.4–10.5)
Chloride: 102 meq/L (ref 96–112)
Creatinine, Ser: 1.13 mg/dL (ref 0.40–1.50)
GFR: 66.29 mL/min (ref >60.00)
Glucose, Bld: 137 mg/dL — ABNORMAL HIGH (ref 70–99)
Potassium: 4.1 meq/L (ref 3.5–5.1)
Sodium: 141 meq/L (ref 135–145)
Total Bilirubin: 0.5 mg/dL (ref 0.2–1.2)
Total Protein: 6.3 g/dL (ref 6.0–8.3)

## 2023-08-18 LAB — LIPID PANEL
Cholesterol: 142 mg/dL (ref 0–200)
HDL: 45.1 mg/dL (ref >39.00)
LDL Cholesterol: 66 mg/dL (ref 0–99)
NonHDL: 96.63
Total CHOL/HDL Ratio: 3
Triglycerides: 152 mg/dL — ABNORMAL HIGH (ref 0.0–149.0)
VLDL: 30.4 mg/dL (ref 0.0–40.0)

## 2023-08-18 LAB — PSA, MEDICARE: PSA: 2.13 ng/mL (ref 0.10–4.00)

## 2023-08-18 LAB — HEMOGLOBIN A1C: Hgb A1c MFr Bld: 6.9 % — ABNORMAL HIGH (ref 4.6–6.5)

## 2023-08-18 NOTE — Assessment & Plan Note (Signed)
 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

## 2023-08-18 NOTE — Assessment & Plan Note (Signed)
 Chronic  Lab Results  Component Value Date   HGBA1C 6.6 (H) 02/09/2023   Sugars controlled Has cut down on the desserts/sugar Check A1c Continue metformin  XR 500 mg twice daily Stressed regular exercise, diabetic diet Encouraged continued weight loss

## 2023-08-18 NOTE — Assessment & Plan Note (Signed)
Chronic Controlled Continue risperidone 0.5 mg at bedtime

## 2023-08-18 NOTE — Assessment & Plan Note (Signed)
Chronic No longer following with psychiatry Controlled Continue sertraline 50 mg daily, risperidone 0.5 mg nightly

## 2023-08-18 NOTE — Assessment & Plan Note (Addendum)
 Chronic Stable, controlled Continue Lasix  40 mg daily Continue compression socks daily Elevate when sitting, low-sodium diet encouraged Stressed regular exercise-walking Encouraged weight loss

## 2023-08-18 NOTE — Assessment & Plan Note (Signed)
Chronic Regular exercise and healthy diet encouraged Check lipid panel, CMP Continue Crestor 5 mg 3 times weekly

## 2023-08-18 NOTE — Assessment & Plan Note (Signed)
 Chronic Stressed the importance of weight loss Stressed as much exercise as possible-he is currently only walking to get a place to place-stressed walking for exercise Discussed decrease portions, decrease sweets/desserts.  Increase protein and vegetables

## 2023-08-18 NOTE — Assessment & Plan Note (Signed)
 Chronic Blood pressure well controlled CMP, cbc Continue ramipril  5 mg daily

## 2023-08-25 ENCOUNTER — Ambulatory Visit: Payer: Medicare Other | Admitting: Podiatry

## 2023-08-25 ENCOUNTER — Encounter: Payer: Self-pay | Admitting: Podiatry

## 2023-08-25 DIAGNOSIS — M79675 Pain in left toe(s): Secondary | ICD-10-CM | POA: Diagnosis not present

## 2023-08-25 DIAGNOSIS — M79674 Pain in right toe(s): Secondary | ICD-10-CM | POA: Diagnosis not present

## 2023-08-25 DIAGNOSIS — B351 Tinea unguium: Secondary | ICD-10-CM | POA: Diagnosis not present

## 2023-08-25 DIAGNOSIS — E119 Type 2 diabetes mellitus without complications: Secondary | ICD-10-CM

## 2023-08-25 NOTE — Progress Notes (Signed)
 This patient returns to my office for at risk foot care.  This patient requires this care by a professional since this patient will be at risk due to having diabetes.  This patient is unable to cut nails himself since the patient cannot reach his nails.These nails are painful walking and wearing shoes.  This patient presents for at risk foot care today.  General Appearance  Alert, conversant and in no acute stress.  Vascular  Dorsalis pedis and posterior tibial  pulses are not palpable due to swelling. palpable  bilaterally.  Capillary return is within normal limits  bilaterally. Temperature is within normal limits  bilaterally.  Neurologic  Senn-Weinstein monofilament wire test within normal limits  bilaterally. Muscle power within normal limits bilaterally.  Nails Thick disfigured discolored nails with subungual debris  from hallux to fifth toes bilaterally. No evidence of bacterial infection or drainage bilaterally.  Orthopedic  No limitations of motion  feet .  No crepitus or effusions noted.  No bony pathology or digital deformities noted.  Skin  normotropic skin with no porokeratosis noted bilaterally.  No signs of infections or ulcers noted.     Onychomycosis  Pain in right toes  Pain in left toes  Consent was obtained for treatment procedures.   Mechanical debridement of nails 1-5  bilaterally performed with a nail nipper.  Filed with dremel without incident.    Return office visit    12  weeks                 Told patient to return for periodic foot care and evaluation due to potential at risk complications.   Helane Gunther DPM

## 2023-09-14 ENCOUNTER — Other Ambulatory Visit: Payer: Self-pay | Admitting: Internal Medicine

## 2023-09-20 ENCOUNTER — Telehealth: Payer: Self-pay | Admitting: Internal Medicine

## 2023-09-20 NOTE — Telephone Encounter (Signed)
 Patient left form for Dr. Geofm to complete.  Please fax back to dentist

## 2023-09-28 NOTE — Telephone Encounter (Signed)
 Form given to Dr. Joshua to look over since Dr. Geofm is out of the office.

## 2023-09-29 NOTE — Telephone Encounter (Signed)
 Patient will need to be cleared with EKG before he will clear patient  Contacted office today but they were closed.  Will follow up on Monday.

## 2023-10-05 ENCOUNTER — Encounter: Payer: Self-pay | Admitting: Internal Medicine

## 2023-10-05 NOTE — Telephone Encounter (Signed)
 Patient okay with waiting until Dr. Geofm returns to address clearance.

## 2023-10-08 ENCOUNTER — Telehealth: Payer: Self-pay | Admitting: Internal Medicine

## 2023-10-08 ENCOUNTER — Encounter: Payer: Self-pay | Admitting: Internal Medicine

## 2023-10-08 NOTE — Telephone Encounter (Signed)
 error

## 2023-10-13 LAB — HM DIABETES EYE EXAM

## 2023-12-01 ENCOUNTER — Encounter: Payer: Self-pay | Admitting: Podiatry

## 2023-12-01 ENCOUNTER — Ambulatory Visit (INDEPENDENT_AMBULATORY_CARE_PROVIDER_SITE_OTHER): Admitting: Podiatry

## 2023-12-01 DIAGNOSIS — B351 Tinea unguium: Secondary | ICD-10-CM

## 2023-12-01 DIAGNOSIS — E119 Type 2 diabetes mellitus without complications: Secondary | ICD-10-CM | POA: Diagnosis not present

## 2023-12-01 DIAGNOSIS — M79674 Pain in right toe(s): Secondary | ICD-10-CM

## 2023-12-01 DIAGNOSIS — M79675 Pain in left toe(s): Secondary | ICD-10-CM

## 2023-12-01 NOTE — Progress Notes (Signed)
 This patient returns to my office for at risk foot care.  This patient requires this care by a professional since this patient will be at risk due to having diabetes.  This patient is unable to cut nails himself since the patient cannot reach his nails.These nails are painful walking and wearing shoes.  This patient presents for at risk foot care today.  General Appearance  Alert, conversant and in no acute stress.  Vascular  Dorsalis pedis and posterior tibial  pulses are not palpable due to swelling. palpable  bilaterally.  Capillary return is within normal limits  bilaterally. Temperature is within normal limits  bilaterally.  Neurologic  Senn-Weinstein monofilament wire test within normal limits  bilaterally. Muscle power within normal limits bilaterally.  Nails Thick disfigured discolored nails with subungual debris  from hallux to fifth toes bilaterally. No evidence of bacterial infection or drainage bilaterally.  Orthopedic  No limitations of motion  feet .  No crepitus or effusions noted.  No bony pathology or digital deformities noted.  Skin  normotropic skin with no porokeratosis noted bilaterally.  No signs of infections or ulcers noted.     Onychomycosis  Pain in right toes  Pain in left toes  Consent was obtained for treatment procedures.   Mechanical debridement of nails 1-5  bilaterally performed with a nail nipper.  Filed with dremel without incident.    Return office visit    12  weeks                 Told patient to return for periodic foot care and evaluation due to potential at risk complications.   Helane Gunther DPM

## 2023-12-26 ENCOUNTER — Other Ambulatory Visit: Payer: Self-pay | Admitting: Internal Medicine

## 2024-02-23 ENCOUNTER — Encounter: Admitting: Internal Medicine

## 2024-03-01 ENCOUNTER — Ambulatory Visit: Admitting: Podiatry

## 2024-03-01 ENCOUNTER — Encounter: Payer: Self-pay | Admitting: Podiatry

## 2024-03-01 DIAGNOSIS — M79674 Pain in right toe(s): Secondary | ICD-10-CM | POA: Diagnosis not present

## 2024-03-01 DIAGNOSIS — B351 Tinea unguium: Secondary | ICD-10-CM

## 2024-03-01 DIAGNOSIS — M79675 Pain in left toe(s): Secondary | ICD-10-CM | POA: Diagnosis not present

## 2024-03-01 DIAGNOSIS — E119 Type 2 diabetes mellitus without complications: Secondary | ICD-10-CM | POA: Diagnosis not present

## 2024-03-01 NOTE — Progress Notes (Signed)
 This patient returns to my office for at risk foot care.  This patient requires this care by a professional since this patient will be at risk due to having diabetes.  This patient is unable to cut nails himself since the patient cannot reach his nails.These nails are painful walking and wearing shoes.  This patient presents for at risk foot care today.  General Appearance  Alert, conversant and in no acute stress.  Vascular  Dorsalis pedis and posterior tibial  pulses are not palpable due to swelling. palpable  bilaterally.  Capillary return is within normal limits  bilaterally. Temperature is within normal limits  bilaterally.  Neurologic  Senn-Weinstein monofilament wire test within normal limits  bilaterally. Muscle power within normal limits bilaterally.  Nails Thick disfigured discolored nails with subungual debris  from hallux to fifth toes bilaterally. No evidence of bacterial infection or drainage bilaterally.  Orthopedic  No limitations of motion  feet .  No crepitus or effusions noted.  No bony pathology or digital deformities noted.  Skin  normotropic skin with no porokeratosis noted bilaterally.  No signs of infections or ulcers noted.     Onychomycosis  Pain in right toes  Pain in left toes  Consent was obtained for treatment procedures.   Mechanical debridement of nails 1-5  bilaterally performed with a nail nipper.  Filed with dremel without incident.    Return office visit    12  weeks                 Told patient to return for periodic foot care and evaluation due to potential at risk complications.   Helane Gunther DPM

## 2024-03-12 ENCOUNTER — Other Ambulatory Visit: Payer: Self-pay | Admitting: Internal Medicine

## 2024-05-29 ENCOUNTER — Encounter: Admitting: Internal Medicine

## 2024-05-31 ENCOUNTER — Ambulatory Visit: Admitting: Podiatry

## 2024-06-07 ENCOUNTER — Ambulatory Visit: Admitting: Podiatry
